# Patient Record
Sex: Female | Born: 1960 | Race: White | Hispanic: No | Marital: Married | Smoking: Never smoker
Health system: Southern US, Community
[De-identification: ages and names within clinical notes are randomized; demographics above are authoritative.]

## PROBLEM LIST (undated history)

## (undated) DIAGNOSIS — K529 Noninfective gastroenteritis and colitis, unspecified: Secondary | ICD-10-CM

## (undated) DIAGNOSIS — H269 Unspecified cataract: Secondary | ICD-10-CM

## (undated) DIAGNOSIS — Z9289 Personal history of other medical treatment: Secondary | ICD-10-CM

## (undated) DIAGNOSIS — E0590 Thyrotoxicosis, unspecified without thyrotoxic crisis or storm: Secondary | ICD-10-CM

## (undated) DIAGNOSIS — B009 Herpesviral infection, unspecified: Secondary | ICD-10-CM

## (undated) DIAGNOSIS — E119 Type 2 diabetes mellitus without complications: Secondary | ICD-10-CM

## (undated) DIAGNOSIS — E559 Vitamin D deficiency, unspecified: Secondary | ICD-10-CM

## (undated) DIAGNOSIS — I10 Essential (primary) hypertension: Secondary | ICD-10-CM

## (undated) DIAGNOSIS — H353110 Nonexudative age-related macular degeneration, right eye, stage unspecified: Secondary | ICD-10-CM

## (undated) DIAGNOSIS — H3530 Unspecified macular degeneration: Secondary | ICD-10-CM

## (undated) DIAGNOSIS — E039 Hypothyroidism, unspecified: Secondary | ICD-10-CM

## (undated) DIAGNOSIS — K219 Gastro-esophageal reflux disease without esophagitis: Secondary | ICD-10-CM

## (undated) DIAGNOSIS — Z8619 Personal history of other infectious and parasitic diseases: Secondary | ICD-10-CM

## (undated) HISTORY — DX: Unspecified macular degeneration: H35.30

## (undated) HISTORY — DX: Unspecified cataract: H26.9

## (undated) HISTORY — DX: Nonexudative age-related macular degeneration, right eye, stage unspecified: H35.3110

## (undated) HISTORY — DX: Vitamin D deficiency, unspecified: E55.9

## (undated) HISTORY — DX: Personal history of other infectious and parasitic diseases: Z86.19

## (undated) HISTORY — DX: Thyrotoxicosis, unspecified without thyrotoxic crisis or storm: E05.90

## (undated) HISTORY — PX: WISDOM TOOTH EXTRACTION: SHX21

## (undated) HISTORY — DX: Personal history of other medical treatment: Z92.89

## (undated) HISTORY — DX: Noninfective gastroenteritis and colitis, unspecified: K52.9

## (undated) HISTORY — DX: Essential (primary) hypertension: I10

## (undated) HISTORY — DX: Herpesviral infection, unspecified: B00.9

## (undated) HISTORY — DX: Hypothyroidism, unspecified: E03.9

## (undated) HISTORY — DX: Gastro-esophageal reflux disease without esophagitis: K21.9

## (undated) HISTORY — DX: Type 2 diabetes mellitus without complications: E11.9

## (undated) HISTORY — PX: COLONOSCOPY: SHX174

---
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
## 1993-03-11 HISTORY — PX: ECTOPIC PREGNANCY SURGERY: SHX613

## 2000-10-28 ENCOUNTER — Other Ambulatory Visit: Admission: RE | Admit: 2000-10-28 | Discharge: 2000-10-28 | Payer: Self-pay | Admitting: Internal Medicine

## 2001-07-09 HISTORY — PX: BUNIONECTOMY: SHX129

## 2001-12-10 ENCOUNTER — Other Ambulatory Visit: Admission: RE | Admit: 2001-12-10 | Discharge: 2001-12-10 | Payer: Self-pay | Admitting: Internal Medicine

## 2003-02-24 ENCOUNTER — Other Ambulatory Visit: Admission: RE | Admit: 2003-02-24 | Discharge: 2003-02-24 | Payer: Self-pay | Admitting: Internal Medicine

## 2003-03-12 HISTORY — PX: RIGHT OOPHORECTOMY: SHX2359

## 2003-03-12 HISTORY — PX: CHOLECYSTECTOMY: SHX55

## 2004-02-08 LAB — HM COLONOSCOPY

## 2004-07-17 ENCOUNTER — Ambulatory Visit: Payer: Self-pay | Admitting: Internal Medicine

## 2004-08-17 ENCOUNTER — Ambulatory Visit: Payer: Self-pay | Admitting: Internal Medicine

## 2005-11-05 ENCOUNTER — Ambulatory Visit: Payer: Self-pay | Admitting: Gastroenterology

## 2005-11-29 ENCOUNTER — Ambulatory Visit: Payer: Self-pay | Admitting: Gastroenterology

## 2013-06-07 LAB — HM DIABETES EYE EXAM

## 2013-07-22 ENCOUNTER — Encounter (INDEPENDENT_AMBULATORY_CARE_PROVIDER_SITE_OTHER): Payer: Self-pay

## 2013-07-22 ENCOUNTER — Ambulatory Visit (INDEPENDENT_AMBULATORY_CARE_PROVIDER_SITE_OTHER): Payer: BC Managed Care – PPO | Admitting: Adult Health

## 2013-07-22 ENCOUNTER — Encounter: Payer: Self-pay | Admitting: Adult Health

## 2013-07-22 VITALS — BP 134/92 | HR 92 | Temp 98.1°F | Resp 14 | Wt 225.5 lb

## 2013-07-22 DIAGNOSIS — E669 Obesity, unspecified: Secondary | ICD-10-CM | POA: Insufficient documentation

## 2013-07-22 MED ORDER — PHENTERMINE HCL 37.5 MG PO TABS: 37.5000 mg | ORAL_TABLET | Freq: Every day | ORAL | Status: DC

## 2013-07-22 NOTE — Progress Notes (Signed)
Patient ID: Courtney Burns, female   DOB: 30-Sep-1960, 53 y.o.   MRN: [id number]   Subjective:    Patient ID: Courtney Burns, female    DOB: 07-14-1960, 53 y.o.   MRN: [id number]  HPI Pt is a pleasant 53 y/o female who presents to clinic to establish care. Previously followed by Dr. Rosario Jacks. She has concerns about her weight. Reports struggling with weight since she was a child. She has lost 30+ lbs in the past only to gain it all back and then some. She is very frustrated with herself. Understands that her health is at risk. She would like guidance and assistance with weight loss.    Past Medical History  Diagnosis Date  . Diabetes mellitus without complication   . GERD (gastroesophageal reflux disease)   . Hypothyroidism   . History of chicken pox      Past Surgical History  Procedure Laterality Date  . Cholecystectomy  2005  . Appendectomy  1970  . Bunionectomy Right 07/2001    right foot  . Right oophorectomy Right 03/2003    Abscessed cyst removed  . Ectopic pregnancy surgery  1995     Family History  Problem Relation Age of Onset  . Hypertension Mother   . Diabetes Mother   . Hyperlipidemia Father   . Heart disease Father     MI  . Hypertension Father   . Diabetes Father   . Depression Father   . COPD Father   . Diabetes Maternal Aunt     Uncontrolled with complications  . Kidney disease Maternal Aunt     Renal failure - dialysis  . Stroke Maternal Grandmother   . Lung cancer Maternal Grandfather   . Heart disease Paternal Grandmother     MI  . Diabetes Paternal Grandmother   . Prostate cancer Paternal Grandfather      History   Social History  . Marital Status: Married    Spouse Name: N/A    Number of Children: 0  . Years of Education: 16   Occupational History  . Customer Service Manager     [employer]   Social History Main Topics  . Smoking status: Never Smoker   . Smokeless tobacco: Not on file  . Alcohol Use: No  . Drug Use: No    . Sexual Activity: Not on file   Other Topics Concern  . Not on file   Social History Narrative   Jaleyah grew up in [place]. She lives in [place] with her husband [name]) and their 3 cat ([name], [place], [place]). She attended [employer] and obtained her Bachelors in [employer] with a minor in Youth worker. She loves cooking and sewing.    Review of Systems  Constitutional:       Weight concerns. Has been overweight most of her life  HENT: Negative.   Eyes: Negative.   Respiratory: Negative.   Cardiovascular: Negative.   Gastrointestinal: Negative.   Endocrine: Negative.   Genitourinary: Negative.   Musculoskeletal: Negative.   Skin: Negative.   Allergic/Immunologic: Negative.   Neurological: Negative.   Hematological: Negative.   Psychiatric/Behavioral: Negative.        Objective:  BP 134/92  Pulse 92  Temp(Src) 98.1 F (36.7 C) (Oral)  Resp 14  Wt 225 lb 8 oz (102.286 kg)  SpO2 98%  LMP 07/07/2013   Physical Exam  Constitutional: She is oriented to person, place, and time. No distress.  Overweight, pleasant 53 y/o female  HENT:  Head: Normocephalic and atraumatic.  Eyes: Conjunctivae and EOM are normal.  Neck: Normal range of motion. Neck supple.  Cardiovascular: Normal rate, regular rhythm, normal heart sounds and intact distal pulses.  Exam reveals no gallop and no friction rub.   No murmur heard. Pulmonary/Chest: Effort normal and breath sounds normal. No respiratory distress. She has no wheezes. She has no rales.  Musculoskeletal: Normal range of motion.  Neurological: She is alert and oriented to person, place, and time. She has normal reflexes. Coordination normal.  Skin: Skin is warm and dry.  Psychiatric: She has a normal mood and affect. Her behavior is normal. Judgment and thought content normal.      Assessment & Plan:   1. Obesity Discussed importance of consistency in whichever program she decides will work best  for her. Recommend Weight Watchers. Increase activity gradually and remain consistent. Discussed pharmacological options to help curb appetite. Used short term. During this time she should make attempts to change bad habits and create healthier options. Start phentermine 37.5 mg daily. Follow up in 1-2 months or sooner if necessary.

## 2013-07-22 NOTE — Progress Notes (Signed)
Pre visit review using our clinic review tool, if applicable. No additional management support is needed unless otherwise documented below in the visit note. 

## 2013-07-22 NOTE — Patient Instructions (Signed)
   Thank you for choosing [facility] for your health care needs.  Start Phentermine for weight loss. Increase physical activity. I recommend Weight watchers which is very effective.  Schedule you physical exam at your earliest convenience.

## 2013-08-09 DIAGNOSIS — Z9289 Personal history of other medical treatment: Secondary | ICD-10-CM

## 2013-08-09 HISTORY — DX: Personal history of other medical treatment: Z92.89

## 2013-08-19 ENCOUNTER — Encounter: Payer: Self-pay | Admitting: Adult Health

## 2013-08-19 ENCOUNTER — Other Ambulatory Visit (HOSPITAL_COMMUNITY)
Admission: RE | Admit: 2013-08-19 | Discharge: 2013-08-19 | Disposition: A | Discharge status: Home / Self Care | Payer: BC Managed Care – PPO | Source: Ambulatory Visit | Attending: Adult Health | Admitting: Adult Health

## 2013-08-19 ENCOUNTER — Ambulatory Visit (INDEPENDENT_AMBULATORY_CARE_PROVIDER_SITE_OTHER): Payer: BC Managed Care – PPO | Admitting: Adult Health

## 2013-08-19 VITALS — BP 130/82 | HR 88 | Temp 98.4°F | Resp 14 | Ht 63.5 in | Wt 222.0 lb

## 2013-08-19 DIAGNOSIS — Z0000 Encounter for general adult medical examination without abnormal findings: Secondary | ICD-10-CM

## 2013-08-19 DIAGNOSIS — Z1151 Encounter for screening for human papillomavirus (HPV): Secondary | ICD-10-CM | POA: Insufficient documentation

## 2013-08-19 DIAGNOSIS — Z01419 Encounter for gynecological examination (general) (routine) without abnormal findings: Secondary | ICD-10-CM | POA: Insufficient documentation

## 2013-08-19 DIAGNOSIS — E785 Hyperlipidemia, unspecified: Secondary | ICD-10-CM

## 2013-08-19 DIAGNOSIS — E119 Type 2 diabetes mellitus without complications: Secondary | ICD-10-CM | POA: Insufficient documentation

## 2013-08-19 DIAGNOSIS — Z1239 Encounter for other screening for malignant neoplasm of breast: Secondary | ICD-10-CM

## 2013-08-19 DIAGNOSIS — E039 Hypothyroidism, unspecified: Secondary | ICD-10-CM | POA: Insufficient documentation

## 2013-08-19 NOTE — Patient Instructions (Signed)
  You had your annual physical exam today including Pap.  Return for your fasting labs at your earliest convenience. Nothing to eat or drink after midnight except water. Please schedule a lab appointment.  I will contact you with the results of your labs once they are available.  Mammogram ordered. Please schedule this at your earliest convenience.

## 2013-08-19 NOTE — Progress Notes (Signed)
Pre visit review using our clinic review tool, if applicable. No additional management support is needed unless otherwise documented below in the visit note. 

## 2013-08-19 NOTE — Addendum Note (Signed)
Addended by: Karlene Einstein D on: 08/19/2013 04:06 PM   Modules accepted: Orders

## 2013-08-19 NOTE — Progress Notes (Signed)
Patient ID: Courtney Burns, female   DOB: [birth year], 53 y.o.   MRN: [id number]   Subjective:    Patient ID: Courtney Burns, female    DOB: 06/19/1960, 53 y.o.   MRN: [id number]  HPI  Pt is a pleasant 53 y/o female who presents to clinic for her annual physical exam including breast, Pap and labs. Overall, she is feeling well. No concerns this visit.    Past Medical History  Diagnosis Date  . Diabetes mellitus without complication   . GERD (gastroesophageal reflux disease)   . Hypothyroidism   . History of chicken pox      Past Surgical History  Procedure Laterality Date  . Cholecystectomy  2005  . Appendectomy  1970  . Bunionectomy Right 07/2001    right foot  . Right oophorectomy Right 03/2003    Abscessed cyst removed  . Ectopic pregnancy surgery  1995     Family History  Problem Relation Age of Onset  . Hypertension Mother   . Diabetes Mother   . Hyperlipidemia Father   . Heart disease Father     MI  . Hypertension Father   . Diabetes Father   . Depression Father   . COPD Father   . Diabetes Maternal Aunt     Uncontrolled with complications  . Kidney disease Maternal Aunt     Renal failure - dialysis  . Stroke Maternal Grandmother   . Lung cancer Maternal Grandfather   . Heart disease Paternal Grandmother     MI  . Diabetes Paternal Grandmother   . Prostate cancer Paternal Grandfather      History   Social History  . Marital Status: Married    Spouse Name: N/A    Number of Children: 0  . Years of Education: 16   Occupational History  . Customer Service Manager     [employer]   Social History Main Topics  . Smoking status: Never Smoker   . Smokeless tobacco: Not on file  . Alcohol Use: No  . Drug Use: No  . Sexual Activity: Not on file   Other Topics Concern  . Not on file   Social History Narrative   Courtney Burns grew up in [place]. She lives in [place] with her husband [name]) and their 3 cat ([name], [place], [place]). She  attended [employer] and obtained her Bachelors in [employer] with a minor in Youth worker. She loves cooking and sewing.     Current Outpatient Prescriptions on File Prior to Visit  Medication Sig Dispense Refill  . levothyroxine (SYNTHROID, LEVOTHROID) 75 MCG tablet Take 75 mcg by mouth daily before breakfast.      . lisinopril (PRINIVIL,ZESTRIL) 5 MG tablet Take 5 mg by mouth daily.      . metFORMIN (GLUCOPHAGE) 1000 MG tablet Take 1,000 mg by mouth 2 (two) times daily with a meal.      . phentermine (ADIPEX-P) 37.5 MG tablet Take 1 tablet (37.5 mg total) by mouth daily before breakfast.  30 tablet  3   No current facility-administered medications on file prior to visit.     Review of Systems  Constitutional: Negative.   HENT: Negative.   Eyes: Negative.   Respiratory: Negative.   Cardiovascular: Negative.   Gastrointestinal: Negative.   Endocrine: Negative.   Genitourinary: Negative.   Musculoskeletal: Negative.   Skin: Negative.   Allergic/Immunologic: Negative.   Neurological: Negative.   Hematological: Negative.   Psychiatric/Behavioral: Negative.  Objective:  BP 130/82  Pulse 88  Temp(Src) 98.4 F (36.9 C) (Oral)  Resp 14  Ht 5' 3.5" (1.613 m)  Wt 222 lb (100.699 kg)  BMI 38.70 kg/m2  SpO2 97%  LMP 07/07/2013   Physical Exam  Constitutional: She is oriented to person, place, and time. She appears well-developed and well-nourished. No distress.  HENT:  Head: Normocephalic and atraumatic.  Right Ear: External ear normal.  Left Ear: External ear normal.  Nose: Nose normal.  Mouth/Throat: Oropharynx is clear and moist.  Eyes: Conjunctivae and EOM are normal. Pupils are equal, round, and reactive to light.  Neck: Normal range of motion. Neck supple. No tracheal deviation present. No thyromegaly present.  Cardiovascular: Normal rate, regular rhythm, normal heart sounds and intact distal pulses.  Exam reveals no gallop and no  friction rub.   No murmur heard. Pulmonary/Chest: Effort normal and breath sounds normal. No respiratory distress. She has no wheezes. She has no rales. Right breast exhibits no inverted nipple, no mass, no nipple discharge, no skin change and no tenderness. Left breast exhibits no inverted nipple, no mass, no nipple discharge, no skin change and no tenderness. Breasts are symmetrical.  Abdominal: Soft. Bowel sounds are normal. She exhibits no distension and no mass. There is no tenderness. There is no rebound and no guarding. Hernia confirmed negative in the right inguinal area.  Genitourinary: Rectum normal. Rectal exam shows no external hemorrhoid, no internal hemorrhoid, no fissure, no mass, no tenderness and anal tone normal. Guaiac negative stool. No breast swelling, tenderness, discharge or bleeding. No labial fusion. There is no rash, tenderness, lesion or injury on the right labia. There is rash on the left labia. There is no tenderness, lesion or injury on the left labia. Uterus is not deviated, not enlarged, not fixed and not tender. Cervix exhibits no motion tenderness, no discharge and no friability. Right adnexum displays no mass, no tenderness and no fullness. Left adnexum displays no mass, no tenderness and no fullness. No erythema around the vagina. No foreign body around the vagina. No signs of injury around the vagina. No vaginal discharge found.  Musculoskeletal: Normal range of motion. She exhibits no edema and no tenderness.  Lymphadenopathy:    She has no cervical adenopathy.       Right: No inguinal adenopathy present.       Left: No inguinal adenopathy present.  Neurological: She is alert and oriented to person, place, and time. She has normal reflexes. No cranial nerve deficit. Coordination normal.  Skin: Skin is warm and dry.  Psychiatric: She has a normal mood and affect. Her behavior is normal. Judgment and thought content normal.      Assessment & Plan:   1. Routine  general medical examination at a health care facility Normal physical exam including breast and PAP/pelvic. Screenings addressed. Labs ordered. Normal exam.   - Vit D  25 hydroxy (rtn osteoporosis monitoring); Future - CBC with Differential; Future - Vitamin B12; Future - Comprehensive metabolic panel; Future  2. Hypothyroidism Check labs. Continue medication and adjust as indicated. Continue to follow - TSH; Future  3. HLD (hyperlipidemia) Check lipids. Follow - Lipid panel; Future  4. Screening for breast cancer Order provided for mammogram. Patient will self schedule - MM DIGITAL SCREENING BILATERAL; Future  5. Diabetes mellitus, type 2 On metformin. Check labs. Continue to follow - Hemoglobin A1c; Future

## 2013-08-24 ENCOUNTER — Encounter: Payer: Self-pay | Admitting: *Deleted

## 2013-08-24 LAB — CYTOLOGY - PAP

## 2013-08-26 ENCOUNTER — Other Ambulatory Visit (INDEPENDENT_AMBULATORY_CARE_PROVIDER_SITE_OTHER): Payer: BC Managed Care – PPO

## 2013-08-26 DIAGNOSIS — E039 Hypothyroidism, unspecified: Secondary | ICD-10-CM

## 2013-08-26 DIAGNOSIS — E785 Hyperlipidemia, unspecified: Secondary | ICD-10-CM

## 2013-08-26 DIAGNOSIS — Z0000 Encounter for general adult medical examination without abnormal findings: Secondary | ICD-10-CM

## 2013-08-26 DIAGNOSIS — E119 Type 2 diabetes mellitus without complications: Secondary | ICD-10-CM

## 2013-08-26 LAB — LIPID PANEL
Cholesterol: 189 mg/dL (ref 0–200)
HDL: 33.5 mg/dL — ABNORMAL LOW (ref >39.00)
LDL Cholesterol: 126 mg/dL — ABNORMAL HIGH (ref 0–99)
NonHDL: 155.5
Total CHOL/HDL Ratio: 6
Triglycerides: 149 mg/dL (ref 0.0–149.0)
VLDL: 29.8 mg/dL (ref 0.0–40.0)

## 2013-08-26 LAB — COMPREHENSIVE METABOLIC PANEL
ALT: 65 U/L — ABNORMAL HIGH (ref 0–35)
AST: 40 U/L — ABNORMAL HIGH (ref 0–37)
Albumin: 4.1 g/dL (ref 3.5–5.2)
Alkaline Phosphatase: 73 U/L (ref 39–117)
BUN: 14 mg/dL (ref 6–23)
CO2: 23 mEq/L (ref 19–32)
Calcium: 9.3 mg/dL (ref 8.4–10.5)
Chloride: 107 mEq/L (ref 96–112)
Creatinine, Ser: 0.8 mg/dL (ref 0.4–1.2)
GFR: 82.94 mL/min (ref >60.00)
Glucose, Bld: 173 mg/dL — ABNORMAL HIGH (ref 70–99)
Potassium: 4.4 mEq/L (ref 3.5–5.1)
Sodium: 138 mEq/L (ref 135–145)
Total Bilirubin: 0.5 mg/dL (ref 0.2–1.2)
Total Protein: 6.6 g/dL (ref 6.0–8.3)

## 2013-08-26 LAB — CBC WITH DIFFERENTIAL/PLATELET
Basophils Absolute: 0 10*3/uL (ref 0.0–0.1)
Basophils Relative: 0.5 % (ref 0.0–3.0)
Eosinophils Absolute: 0.2 10*3/uL (ref 0.0–0.7)
Eosinophils Relative: 2.2 % (ref 0.0–5.0)
HCT: 39.9 % (ref 36.0–46.0)
Hemoglobin: 13.3 g/dL (ref 12.0–15.0)
Lymphocytes Relative: 27.2 % (ref 12.0–46.0)
Lymphs Abs: 2.2 10*3/uL (ref 0.7–4.0)
MCHC: 33.4 g/dL (ref 30.0–36.0)
MCV: 87.2 fl (ref 78.0–100.0)
Monocytes Absolute: 0.6 10*3/uL (ref 0.1–1.0)
Monocytes Relative: 7.7 % (ref 3.0–12.0)
Neutro Abs: 4.9 10*3/uL (ref 1.4–7.7)
Neutrophils Relative %: 62.4 % (ref 43.0–77.0)
Platelets: 418 10*3/uL — ABNORMAL HIGH (ref 150.0–400.0)
RBC: 4.58 Mil/uL (ref 3.87–5.11)
RDW: 14 % (ref 11.5–15.5)
WBC: 7.9 10*3/uL (ref 4.0–10.5)

## 2013-08-26 LAB — HEMOGLOBIN A1C: Hgb A1c MFr Bld: 7.4 % — ABNORMAL HIGH (ref 4.6–6.5)

## 2013-08-26 LAB — VITAMIN B12: Vitamin B-12: 279 pg/mL (ref 211–911)

## 2013-08-26 LAB — VITAMIN D 25 HYDROXY (VIT D DEFICIENCY, FRACTURES): VITD: 12.53 ng/mL

## 2013-08-26 LAB — TSH: TSH: 2.68 u[IU]/mL (ref 0.35–4.50)

## 2013-08-27 ENCOUNTER — Other Ambulatory Visit: Payer: Self-pay | Admitting: Adult Health

## 2013-08-27 ENCOUNTER — Encounter: Payer: Self-pay | Admitting: Adult Health

## 2013-08-27 DIAGNOSIS — R7401 Elevation of levels of liver transaminase levels: Secondary | ICD-10-CM

## 2013-08-27 DIAGNOSIS — R740 Nonspecific elevation of levels of transaminase and lactic acid dehydrogenase [LDH]: Principal | ICD-10-CM

## 2013-09-07 LAB — HM PAP SMEAR: HM Pap smear: NEGATIVE

## 2013-09-14 ENCOUNTER — Other Ambulatory Visit (INDEPENDENT_AMBULATORY_CARE_PROVIDER_SITE_OTHER): Payer: BC Managed Care – PPO

## 2013-09-14 ENCOUNTER — Other Ambulatory Visit: Payer: Self-pay | Admitting: Adult Health

## 2013-09-14 ENCOUNTER — Encounter: Payer: Self-pay | Admitting: Adult Health

## 2013-09-14 DIAGNOSIS — R7401 Elevation of levels of liver transaminase levels: Secondary | ICD-10-CM

## 2013-09-14 DIAGNOSIS — R7402 Elevation of levels of lactic acid dehydrogenase (LDH): Secondary | ICD-10-CM

## 2013-09-14 DIAGNOSIS — R740 Nonspecific elevation of levels of transaminase and lactic acid dehydrogenase [LDH]: Principal | ICD-10-CM

## 2013-09-14 LAB — HEPATIC FUNCTION PANEL
ALT: 61 U/L — ABNORMAL HIGH (ref 0–35)
AST: 44 U/L — ABNORMAL HIGH (ref 0–37)
Albumin: 4 g/dL (ref 3.5–5.2)
Alkaline Phosphatase: 74 U/L (ref 39–117)
Bilirubin, Direct: 0 mg/dL (ref 0.0–0.3)
Total Bilirubin: 0.5 mg/dL (ref 0.2–1.2)
Total Protein: 6.9 g/dL (ref 6.0–8.3)

## 2013-09-15 ENCOUNTER — Other Ambulatory Visit: Payer: Self-pay | Admitting: Adult Health

## 2013-09-15 DIAGNOSIS — R740 Nonspecific elevation of levels of transaminase and lactic acid dehydrogenase [LDH]: Principal | ICD-10-CM

## 2013-09-15 DIAGNOSIS — R7401 Elevation of levels of liver transaminase levels: Secondary | ICD-10-CM

## 2013-09-15 NOTE — Progress Notes (Signed)
Notified patient of Raquel's comments. Patient verbalized understanding. Will be out of town until 09/26/13. Lab appt scheduled for 09/28/13 at 10:45am

## 2013-09-15 NOTE — Progress Notes (Signed)
Called patient's cell phone, no answer. Left message on voicemail asking patient to call the office at her earliest convenience.

## 2013-09-23 ENCOUNTER — Telehealth: Payer: Self-pay | Admitting: Adult Health

## 2013-09-23 NOTE — Telephone Encounter (Signed)
She is on vacation she will schedule when she gets back.

## 2013-09-23 NOTE — Telephone Encounter (Signed)
LMTCB to check to see if she has scheduled her mammo/msn

## 2013-09-28 ENCOUNTER — Encounter: Payer: Self-pay | Admitting: Adult Health

## 2013-09-28 ENCOUNTER — Other Ambulatory Visit (INDEPENDENT_AMBULATORY_CARE_PROVIDER_SITE_OTHER): Payer: BC Managed Care – PPO

## 2013-09-28 DIAGNOSIS — R7402 Elevation of levels of lactic acid dehydrogenase (LDH): Secondary | ICD-10-CM

## 2013-09-28 DIAGNOSIS — R740 Nonspecific elevation of levels of transaminase and lactic acid dehydrogenase [LDH]: Principal | ICD-10-CM

## 2013-09-28 DIAGNOSIS — R7401 Elevation of levels of liver transaminase levels: Secondary | ICD-10-CM

## 2013-09-28 LAB — IRON: Iron: 72 ug/dL (ref 42–145)

## 2013-09-28 LAB — FERRITIN: Ferritin: 69.9 ng/mL (ref 10.0–291.0)

## 2013-09-29 ENCOUNTER — Encounter: Payer: Self-pay | Admitting: Adult Health

## 2013-09-29 LAB — HEPATITIS C ANTIBODY: HCV Ab: NEGATIVE

## 2013-09-29 LAB — ANTI-SMITH ANTIBODY: ENA SM Ab Ser-aCnc: <1

## 2013-09-29 LAB — HEPATITIS B SURFACE ANTIBODY,QUALITATIVE: Hep B S Ab: NEGATIVE

## 2013-09-29 LAB — HEPATITIS B SURFACE ANTIGEN: Hepatitis B Surface Ag: NEGATIVE

## 2013-09-29 LAB — ANA: Anti Nuclear Antibody(ANA): NEGATIVE

## 2013-09-29 LAB — HEPATITIS A ANTIBODY, IGM: Hep A IgM: NONREACTIVE

## 2013-09-29 LAB — HEPATITIS B CORE ANTIBODY, TOTAL: Hep B Core Total Ab: NONREACTIVE

## 2013-10-01 ENCOUNTER — Encounter: Payer: Self-pay | Admitting: Adult Health

## 2013-10-04 LAB — HM MAMMOGRAPHY: HM Mammogram: NEGATIVE

## 2013-10-05 ENCOUNTER — Other Ambulatory Visit: Payer: Self-pay | Admitting: Adult Health

## 2013-10-05 DIAGNOSIS — K760 Fatty (change of) liver, not elsewhere classified: Secondary | ICD-10-CM

## 2013-10-05 NOTE — Progress Notes (Signed)
Ultrasound of abdomen shows fatty liver disease. I am referring you to GI. Diet, exercise, reducing weight is important.

## 2013-10-05 NOTE — Progress Notes (Signed)
Spoke to patient to notify her of Raquel's comments. Patient verbalized understanding.

## 2013-10-18 ENCOUNTER — Other Ambulatory Visit: Payer: Self-pay | Admitting: Adult Health

## 2013-10-27 ENCOUNTER — Encounter: Payer: Self-pay | Admitting: Adult Health

## 2013-10-29 ENCOUNTER — Other Ambulatory Visit: Payer: Self-pay | Admitting: Adult Health

## 2013-11-17 ENCOUNTER — Other Ambulatory Visit: Payer: Self-pay | Admitting: Adult Health

## 2013-12-20 ENCOUNTER — Telehealth: Payer: Self-pay

## 2013-12-20 NOTE — Telephone Encounter (Signed)
This patient called and stated her friend Manuela Schwartz told her that at the gym you mentioned you would take her on as a new patient.  Is it still okay to add her on your schedule?   Pt callback - [phone]

## 2013-12-20 NOTE — Telephone Encounter (Signed)
Yes, fine to add. Next 52mn new pt

## 2014-01-16 ENCOUNTER — Encounter: Payer: Self-pay | Admitting: Internal Medicine

## 2014-02-07 ENCOUNTER — Encounter: Payer: Self-pay | Admitting: Internal Medicine

## 2014-02-07 ENCOUNTER — Ambulatory Visit (INDEPENDENT_AMBULATORY_CARE_PROVIDER_SITE_OTHER): Payer: BC Managed Care – PPO | Admitting: Internal Medicine

## 2014-02-07 VITALS — BP 123/81 | HR 90 | Temp 98.5°F | Ht 63.5 in | Wt 201.5 lb

## 2014-02-07 DIAGNOSIS — E785 Hyperlipidemia, unspecified: Secondary | ICD-10-CM

## 2014-02-07 DIAGNOSIS — K7581 Nonalcoholic steatohepatitis (NASH): Secondary | ICD-10-CM

## 2014-02-07 DIAGNOSIS — E669 Obesity, unspecified: Secondary | ICD-10-CM

## 2014-02-07 DIAGNOSIS — E039 Hypothyroidism, unspecified: Secondary | ICD-10-CM

## 2014-02-07 DIAGNOSIS — E119 Type 2 diabetes mellitus without complications: Secondary | ICD-10-CM

## 2014-02-07 LAB — COMPREHENSIVE METABOLIC PANEL
ALT: 40 U/L — ABNORMAL HIGH (ref 0–35)
AST: 32 U/L (ref 0–37)
Albumin: 4.3 g/dL (ref 3.5–5.2)
Alkaline Phosphatase: 83 U/L (ref 39–117)
BUN: 12 mg/dL (ref 6–23)
CO2: 21 mEq/L (ref 19–32)
Calcium: 9 mg/dL (ref 8.4–10.5)
Chloride: 104 mEq/L (ref 96–112)
Creatinine, Ser: 0.7 mg/dL (ref 0.4–1.2)
GFR: 86.69 mL/min (ref >60.00)
Glucose, Bld: 108 mg/dL — ABNORMAL HIGH (ref 70–99)
Potassium: 4 mEq/L (ref 3.5–5.1)
Sodium: 136 mEq/L (ref 135–145)
Total Bilirubin: 0.3 mg/dL (ref 0.2–1.2)
Total Protein: 6.9 g/dL (ref 6.0–8.3)

## 2014-02-07 LAB — HM DIABETES FOOT EXAM: HM Diabetic Foot Exam: NORMAL

## 2014-02-07 LAB — HEMOGLOBIN A1C: Hgb A1c MFr Bld: 7.3 % — ABNORMAL HIGH (ref 4.6–6.5)

## 2014-02-07 NOTE — Progress Notes (Signed)
Pre visit review using our clinic review tool, if applicable. No additional management support is needed unless otherwise documented below in the visit note. 

## 2014-02-07 NOTE — Assessment & Plan Note (Signed)
Will check LFTs with labs today.

## 2014-02-07 NOTE — Patient Instructions (Signed)
Labs today.  Follow up 3 months.

## 2014-02-07 NOTE — Assessment & Plan Note (Signed)
Will check A1c with labs today. Continue Metformin.

## 2014-02-07 NOTE — Assessment & Plan Note (Signed)
Will repeat LFTs with labs today. Reviewed previous workup and [place].

## 2014-02-07 NOTE — Assessment & Plan Note (Signed)
Wt Readings from Last 3 Encounters:  02/07/14 201 lb 8 oz (91.4 kg)  08/19/13 222 lb (100.699 kg)  07/22/13 225 lb 8 oz (102.286 kg)   Encouraged continued healthy diet and exercise. Congratulated pt on weight loss.

## 2014-02-07 NOTE — Progress Notes (Signed)
Subjective:    Patient ID: Courtney Burns, female    DOB: September 09, 1960, 53 y.o.   MRN: [id number]  HPI 53YO female presents to establish care. Previously a patient of Raquel Rey's.  Wt Readings from Last 3 Encounters:  02/07/14 201 lb 8 oz (91.4 kg)  08/19/13 222 lb (100.699 kg)  07/22/13 225 lb 8 oz (102.286 kg)   DM - BG have been running less than 150. Compliant with metformin.  Limiting processed foods. Limiting diet soda. Drinking water.  Goal has been to lose 1lb per week.  Feeling well. No concerns today.  Review of Systems  Constitutional: Negative for fever, chills, appetite change, fatigue and unexpected weight change.  Eyes: Negative for visual disturbance.  Respiratory: Negative for shortness of breath.   Cardiovascular: Negative for chest pain and leg swelling.  Gastrointestinal: Negative for nausea, vomiting, abdominal pain, diarrhea and constipation.  Musculoskeletal: Negative for myalgias and arthralgias.  Skin: Negative for color change and rash.  Hematological: Negative for adenopathy. Does not bruise/bleed easily.  Psychiatric/Behavioral: Negative for sleep disturbance and dysphoric mood. The patient is not nervous/anxious.        Objective:    BP 123/81 mmHg  Pulse 90  Temp(Src) 98.5 F (36.9 C) (Oral)  Ht 5' 3.5" (1.613 m)  Wt 201 lb 8 oz (91.4 kg)  BMI 35.13 kg/m2  SpO2 97%  LMP 07/07/2013 Physical Exam  Constitutional: She is oriented to person, place, and time. She appears well-developed and well-nourished. No distress.  HENT:  Head: Normocephalic and atraumatic.  Right Ear: External ear normal.  Left Ear: External ear normal.  Nose: Nose normal.  Mouth/Throat: Oropharynx is clear and moist. No oropharyngeal exudate.  Eyes: Conjunctivae are normal. Pupils are equal, round, and reactive to light. Right eye exhibits no discharge. Left eye exhibits no discharge. No scleral icterus.  Neck: Normal range of motion. Neck supple. No tracheal  deviation present. No thyromegaly present.  Cardiovascular: Normal rate, regular rhythm, normal heart sounds and intact distal pulses.  Exam reveals no gallop and no friction rub.   No murmur heard. Pulmonary/Chest: Effort normal and breath sounds normal. No accessory muscle usage. No tachypnea. No respiratory distress. She has no decreased breath sounds. She has no wheezes. She has no rhonchi. She has no rales. She exhibits no tenderness.  Musculoskeletal: Normal range of motion. She exhibits no edema or tenderness.  Lymphadenopathy:    She has no cervical adenopathy.  Neurological: She is alert and oriented to person, place, and time. No cranial nerve deficit. She exhibits normal muscle tone. Coordination normal.  Skin: Skin is warm and dry. No rash noted. She is not diaphoretic. No erythema. No pallor.  Psychiatric: She has a normal mood and affect. Her behavior is normal. Judgment and thought content normal.          Assessment & Plan:   Problem List Items Addressed This Visit      Unprioritized   Diabetes mellitus, type 2 - Primary    Will check A1c with labs today. Continue Metformin.    Relevant Orders      Comprehensive metabolic panel      Hemoglobin A1c      Microalbumin / creatinine urine ratio   HLD (hyperlipidemia)    Will check LFTs with labs today.    Relevant Orders      Lipid panel   Hypothyroidism   NASH (nonalcoholic steatohepatitis)    Will repeat LFTs with labs today. Reviewed previous workup  and [place].    Obesity    Wt Readings from Last 3 Encounters:  02/07/14 201 lb 8 oz (91.4 kg)  08/19/13 222 lb (100.699 kg)  07/22/13 225 lb 8 oz (102.286 kg)   Encouraged continued healthy diet and exercise. Congratulated pt on weight loss.        Return in about 3 months (around 05/09/2014) for Recheck of Diabetes.

## 2014-04-11 ENCOUNTER — Other Ambulatory Visit: Payer: Self-pay | Admitting: *Deleted

## 2014-04-11 MED ORDER — LISINOPRIL 5 MG PO TABS: ORAL_TABLET | ORAL | Status: DC

## 2014-04-20 ENCOUNTER — Encounter: Payer: Self-pay | Admitting: Internal Medicine

## 2014-04-20 ENCOUNTER — Telehealth: Payer: Self-pay | Admitting: *Deleted

## 2014-04-20 MED ORDER — METFORMIN HCL 1000 MG PO TABS: 1000.0000 mg | ORAL_TABLET | Freq: Two times a day (BID) | ORAL | Status: DC

## 2014-04-20 NOTE — Telephone Encounter (Signed)
Pt requesting refill on Metformin 1070m, this is a historical medication for [place] Okay to fill?

## 2014-04-20 NOTE — Telephone Encounter (Signed)
Fine to fill for 1 year.

## 2014-05-09 ENCOUNTER — Ambulatory Visit (INDEPENDENT_AMBULATORY_CARE_PROVIDER_SITE_OTHER): Payer: BLUE CROSS/BLUE SHIELD | Admitting: Internal Medicine

## 2014-05-09 ENCOUNTER — Encounter: Payer: Self-pay | Admitting: Internal Medicine

## 2014-05-09 VITALS — BP 129/79 | HR 82 | Temp 98.7°F | Ht 63.5 in | Wt 204.5 lb

## 2014-05-09 DIAGNOSIS — E669 Obesity, unspecified: Secondary | ICD-10-CM

## 2014-05-09 DIAGNOSIS — E039 Hypothyroidism, unspecified: Secondary | ICD-10-CM

## 2014-05-09 DIAGNOSIS — E119 Type 2 diabetes mellitus without complications: Secondary | ICD-10-CM

## 2014-05-09 DIAGNOSIS — E785 Hyperlipidemia, unspecified: Secondary | ICD-10-CM

## 2014-05-09 LAB — COMPREHENSIVE METABOLIC PANEL
ALT: 32 U/L (ref 0–35)
AST: 22 U/L (ref 0–37)
Albumin: 4.2 g/dL (ref 3.5–5.2)
Alkaline Phosphatase: 83 U/L (ref 39–117)
BUN: 13 mg/dL (ref 6–23)
CO2: 25 mEq/L (ref 19–32)
Calcium: 9.1 mg/dL (ref 8.4–10.5)
Chloride: 105 mEq/L (ref 96–112)
Creatinine, Ser: 0.69 mg/dL (ref 0.40–1.20)
GFR: 93.89 mL/min (ref >60.00)
Glucose, Bld: 123 mg/dL — ABNORMAL HIGH (ref 70–99)
Potassium: 4 mEq/L (ref 3.5–5.1)
Sodium: 138 mEq/L (ref 135–145)
Total Bilirubin: 0.3 mg/dL (ref 0.2–1.2)
Total Protein: 6.9 g/dL (ref 6.0–8.3)

## 2014-05-09 LAB — TSH: TSH: 1.53 u[IU]/mL (ref 0.35–4.50)

## 2014-05-09 LAB — MICROALBUMIN / CREATININE URINE RATIO
Creatinine,U: 21.9 mg/dL
Microalb Creat Ratio: 3.2 mg/g (ref 0.0–30.0)
Microalb, Ur: <0.7 mg/dL (ref 0.0–1.9)

## 2014-05-09 LAB — HEMOGLOBIN A1C: Hgb A1c MFr Bld: 7.1 % — ABNORMAL HIGH (ref 4.6–6.5)

## 2014-05-09 MED ORDER — LEVOTHYROXINE SODIUM 75 MCG PO TABS: 75.0000 ug | ORAL_TABLET | Freq: Every morning | ORAL | Status: DC

## 2014-05-09 NOTE — Patient Instructions (Signed)
Labs today

## 2014-05-09 NOTE — Progress Notes (Signed)
Pre visit review using our clinic review tool, if applicable. No additional management support is needed unless otherwise documented below in the visit note. 

## 2014-05-09 NOTE — Assessment & Plan Note (Signed)
Will check TSH with labs today.

## 2014-05-09 NOTE — Progress Notes (Signed)
Subjective:    Patient ID: Courtney Burns, female    DOB: 20-Sep-1960, 54 y.o.   MRN: [id number]  HPI 54YO female presents for follow up.  DM - BG well controlled typically 100, highest 158.  Recently has had worsening hot flashes, up to 3 per day. Now improving, typically 1 per day. Does not want to start medication for this.  Recently gained about 5 lbs. Now back on track with diet and exercise.  Wt Readings from Last 3 Encounters:  05/09/14 204 lb 8 oz (92.761 kg)  02/07/14 201 lb 8 oz (91.4 kg)  08/19/13 222 lb (100.699 kg)    Past medical, surgical, family and social history per today's encounter.  Review of Systems  Constitutional: Positive for diaphoresis. Negative for fever, chills, appetite change, fatigue and unexpected weight change.  Eyes: Negative for visual disturbance.  Respiratory: Negative for shortness of breath.   Cardiovascular: Negative for chest pain and leg swelling.  Gastrointestinal: Negative for nausea, vomiting, abdominal pain, diarrhea and constipation.  Endocrine: Positive for heat intolerance.  Skin: Negative for color change and rash.  Hematological: Negative for adenopathy. Does not bruise/bleed easily.  Psychiatric/Behavioral: Negative for sleep disturbance and dysphoric mood. The patient is not nervous/anxious.        Objective:    BP 129/79 mmHg  Pulse 82  Temp(Src) 98.7 F (37.1 C) (Oral)  Ht 5' 3.5" (1.613 m)  Wt 204 lb 8 oz (92.761 kg)  BMI 35.65 kg/m2  SpO2 98%  LMP 07/07/2013 Physical Exam  Constitutional: She is oriented to person, place, and time. She appears well-developed and well-nourished. No distress.  HENT:  Head: Normocephalic and atraumatic.  Right Ear: External ear normal.  Left Ear: External ear normal.  Nose: Nose normal.  Mouth/Throat: Oropharynx is clear and moist. No oropharyngeal exudate.  Eyes: Conjunctivae are normal. Pupils are equal, round, and reactive to light. Right eye exhibits no discharge. Left  eye exhibits no discharge. No scleral icterus.  Neck: Normal range of motion. Neck supple. No tracheal deviation present. No thyromegaly present.  Cardiovascular: Normal rate, regular rhythm, normal heart sounds and intact distal pulses.  Exam reveals no gallop and no friction rub.   No murmur heard. Pulmonary/Chest: Effort normal and breath sounds normal. No respiratory distress. She has no wheezes. She has no rales. She exhibits no tenderness.  Musculoskeletal: Normal range of motion. She exhibits no edema or tenderness.  Lymphadenopathy:    She has no cervical adenopathy.  Neurological: She is alert and oriented to person, place, and time. No cranial nerve deficit. She exhibits normal muscle tone. Coordination normal.  Skin: Skin is warm and dry. No rash noted. She is not diaphoretic. No erythema. No pallor.  Psychiatric: She has a normal mood and affect. Her behavior is normal. Judgment and thought content normal.          Assessment & Plan:   Problem List Items Addressed This Visit      Unprioritized   Diabetes mellitus, type 2 - Primary    Will check A1c with labs today. Continue Metformin.      Relevant Orders   Comprehensive metabolic panel   Hemoglobin A1c   Microalbumin / creatinine urine ratio   HLD (hyperlipidemia)    Will check LFTs with labs.      Hypothyroidism    Will check TSH with labs today.      Relevant Medications   levothyroxine (SYNTHROID, LEVOTHROID) tablet   Other Relevant Orders  TSH   Obesity    Wt Readings from Last 3 Encounters:  05/09/14 204 lb 8 oz (92.761 kg)  02/07/14 201 lb 8 oz (91.4 kg)  08/19/13 222 lb (100.699 kg)   Body mass index is 35.65 kg/(m^2). Encouraged continued effort at healthy diet and exercise.          Return in about 3 months (around 08/07/2014) for Physical.

## 2014-05-09 NOTE — Assessment & Plan Note (Signed)
Wt Readings from Last 3 Encounters:  05/09/14 204 lb 8 oz (92.761 kg)  02/07/14 201 lb 8 oz (91.4 kg)  08/19/13 222 lb (100.699 kg)   Body mass index is 35.65 kg/(m^2). Encouraged continued effort at healthy diet and exercise.

## 2014-05-09 NOTE — Assessment & Plan Note (Signed)
Will check LFTs with labs.

## 2014-05-09 NOTE — Assessment & Plan Note (Signed)
Will check A1c with labs today. Continue Metformin.

## 2014-07-06 LAB — HM DIABETES EYE EXAM

## 2014-07-19 ENCOUNTER — Encounter: Payer: Self-pay | Admitting: Internal Medicine

## 2014-07-19 ENCOUNTER — Encounter: Payer: Self-pay | Admitting: Nurse Practitioner

## 2014-07-19 ENCOUNTER — Ambulatory Visit (INDEPENDENT_AMBULATORY_CARE_PROVIDER_SITE_OTHER): Payer: BLUE CROSS/BLUE SHIELD | Admitting: Nurse Practitioner

## 2014-07-19 VITALS — BP 138/88 | HR 81 | Temp 98.2°F | Resp 12 | Ht 63.5 in | Wt 213.4 lb

## 2014-07-19 DIAGNOSIS — R300 Dysuria: Secondary | ICD-10-CM | POA: Diagnosis not present

## 2014-07-19 LAB — POCT URINALYSIS DIPSTICK
Bilirubin, UA: NEGATIVE
Glucose, UA: NEGATIVE
Ketones, UA: NEGATIVE
Nitrite, UA: NEGATIVE
Protein, UA: NEGATIVE
Spec Grav, UA: <=1.005
Urobilinogen, UA: 0.2
pH, UA: 5.5

## 2014-07-19 MED ORDER — NITROFURANTOIN MONOHYD MACRO 100 MG PO CAPS: 100.0000 mg | ORAL_CAPSULE | Freq: Two times a day (BID) | ORAL | Status: DC

## 2014-07-19 NOTE — Progress Notes (Signed)
   Subjective:    Patient ID: Courtney Burns, female    DOB: [birth year], 54 y.o.   MRN: [id number]  HPI  Courtney Burns is a 54 yo female with a CC of dysuria and decreased urine output.   1) Pink when wiping, 1 episode this morning, started last thur/fri, felt better this weekend. Reported less urine output despite urgency and dysuria before the weekend.Treatment to date: Increased water                Review of Systems  Constitutional: Negative for fever, chills, diaphoresis and fatigue.  Genitourinary: Positive for dysuria, urgency, frequency, hematuria and decreased urine volume. Negative for flank pain.  Skin: Negative for rash.       Objective:   Physical Exam  Constitutional: She is oriented to person, place, and time. She appears well-developed and well-nourished. No distress.  BP 138/88 mmHg  Pulse 81  Temp(Src) 98.2 F (36.8 C) (Oral)  Resp 12  Ht 5' 3.5" (1.613 m)  Wt 213 lb 6.4 oz (96.798 kg)  BMI 37.20 kg/m2  SpO2 97%   HENT:  Head: Normocephalic and atraumatic.  Right Ear: External ear normal.  Left Ear: External ear normal.  Abdominal: There is no CVA tenderness.  Neurological: She is alert and oriented to person, place, and time. No cranial nerve deficit. She exhibits normal muscle tone. Coordination normal.  Skin: Skin is warm and dry. No rash noted. She is not diaphoretic.  Psychiatric: She has a normal mood and affect. Her behavior is normal. Judgment and thought content normal.          Assessment & Plan:

## 2014-07-19 NOTE — Patient Instructions (Signed)

## 2014-07-19 NOTE — Progress Notes (Signed)
Pre visit review using our clinic review tool, if applicable. No additional management support is needed unless otherwise documented below in the visit note. 

## 2014-07-22 LAB — URINE CULTURE: Colony Count: >=100000

## 2014-07-24 NOTE — Assessment & Plan Note (Signed)
POCT urine - tx based on this and await culture results. Macrobid 100 mg twice a day for 5 days.

## 2014-08-02 ENCOUNTER — Encounter: Payer: Self-pay | Admitting: *Deleted

## 2014-08-03 ENCOUNTER — Encounter: Payer: Self-pay | Admitting: Internal Medicine

## 2014-08-04 ENCOUNTER — Other Ambulatory Visit: Payer: Self-pay | Admitting: *Deleted

## 2014-08-04 MED ORDER — VALACYCLOVIR HCL 500 MG PO TABS: 500.0000 mg | ORAL_TABLET | Freq: Two times a day (BID) | ORAL | Status: DC

## 2014-08-18 ENCOUNTER — Encounter: Payer: Self-pay | Admitting: Internal Medicine

## 2014-08-24 ENCOUNTER — Ambulatory Visit: Payer: BLUE CROSS/BLUE SHIELD | Admitting: Internal Medicine

## 2014-10-03 ENCOUNTER — Ambulatory Visit: Payer: BLUE CROSS/BLUE SHIELD | Admitting: Internal Medicine

## 2014-10-04 ENCOUNTER — Encounter: Payer: Self-pay | Admitting: Internal Medicine

## 2014-10-04 ENCOUNTER — Ambulatory Visit (INDEPENDENT_AMBULATORY_CARE_PROVIDER_SITE_OTHER): Payer: BLUE CROSS/BLUE SHIELD | Admitting: Internal Medicine

## 2014-10-04 VITALS — BP 136/85 | HR 88 | Temp 98.3°F | Ht 63.5 in | Wt 214.5 lb

## 2014-10-04 DIAGNOSIS — E119 Type 2 diabetes mellitus without complications: Secondary | ICD-10-CM | POA: Diagnosis not present

## 2014-10-04 DIAGNOSIS — E669 Obesity, unspecified: Secondary | ICD-10-CM | POA: Diagnosis not present

## 2014-10-04 DIAGNOSIS — E039 Hypothyroidism, unspecified: Secondary | ICD-10-CM | POA: Diagnosis not present

## 2014-10-04 DIAGNOSIS — L578 Other skin changes due to chronic exposure to nonionizing radiation: Secondary | ICD-10-CM

## 2014-10-04 DIAGNOSIS — E785 Hyperlipidemia, unspecified: Secondary | ICD-10-CM | POA: Diagnosis not present

## 2014-10-04 LAB — COMPREHENSIVE METABOLIC PANEL
ALT: 35 U/L (ref 0–35)
AST: 25 U/L (ref 0–37)
Albumin: 4 g/dL (ref 3.5–5.2)
Alkaline Phosphatase: 81 U/L (ref 39–117)
BUN: 13 mg/dL (ref 6–23)
CO2: 22 mEq/L (ref 19–32)
Calcium: 9.1 mg/dL (ref 8.4–10.5)
Chloride: 105 mEq/L (ref 96–112)
Creatinine, Ser: 0.79 mg/dL (ref 0.40–1.20)
GFR: 80.2 mL/min (ref >60.00)
Glucose, Bld: 137 mg/dL — ABNORMAL HIGH (ref 70–99)
Potassium: 4.2 mEq/L (ref 3.5–5.1)
Sodium: 140 mEq/L (ref 135–145)
Total Bilirubin: 0.3 mg/dL (ref 0.2–1.2)
Total Protein: 6.4 g/dL (ref 6.0–8.3)

## 2014-10-04 LAB — LIPID PANEL
Cholesterol: 178 mg/dL (ref 0–200)
HDL: 34.9 mg/dL — ABNORMAL LOW (ref >39.00)
LDL Cholesterol: 110 mg/dL — ABNORMAL HIGH (ref 0–99)
NonHDL: 143.1
Total CHOL/HDL Ratio: 5
Triglycerides: 168 mg/dL — ABNORMAL HIGH (ref 0.0–149.0)
VLDL: 33.6 mg/dL (ref 0.0–40.0)

## 2014-10-04 LAB — TSH: TSH: 2.89 u[IU]/mL (ref 0.35–4.50)

## 2014-10-04 LAB — MICROALBUMIN / CREATININE URINE RATIO
Creatinine,U: 102.3 mg/dL
Microalb Creat Ratio: 0.7 mg/g (ref 0.0–30.0)
Microalb, Ur: <0.7 mg/dL (ref 0.0–1.9)

## 2014-10-04 LAB — HEMOGLOBIN A1C: Hgb A1c MFr Bld: 7.1 % — ABNORMAL HIGH (ref 4.6–6.5)

## 2014-10-04 NOTE — Assessment & Plan Note (Signed)
Will check A1c with labs. Continue Metformin.

## 2014-10-04 NOTE — Assessment & Plan Note (Signed)
Will check TSH with labs. Continue Levothyroxine.

## 2014-10-04 NOTE — Progress Notes (Signed)
Subjective:    Patient ID: Courtney Burns, female    DOB: 1960-08-18, 54 y.o.   MRN: [id number]  HPI  54YO female presents for follow up.  DM - BG running near 150 for high and 100 fasting. Compliant with medications.  Started diet program at work. Following healthy diet and plans to exercise.  Wt Readings from Last 3 Encounters:  10/04/14 214 lb 8 oz (97.297 kg)  07/19/14 213 lb 6.4 oz (96.798 kg)  05/09/14 204 lb 8 oz (92.761 kg)   Rash - Noted rash over arms and lower legs after recent vacation to beach. Not painful or itchy. Seems to be improving.  Past medical, surgical, family and social history per today's encounter.  Review of Systems  Constitutional: Negative for fever, chills, appetite change, fatigue and unexpected weight change.  Eyes: Negative for visual disturbance.  Respiratory: Negative for shortness of breath.   Cardiovascular: Negative for chest pain and leg swelling.  Gastrointestinal: Negative for nausea, vomiting, abdominal pain, diarrhea and constipation.  Skin: Positive for rash. Negative for color change.  Hematological: Negative for adenopathy. Does not bruise/bleed easily.  Psychiatric/Behavioral: Negative for dysphoric mood. The patient is not nervous/anxious.        Objective:    BP 136/85 mmHg  Pulse 88  Temp(Src) 98.3 F (36.8 C) (Oral)  Ht 5' 3.5" (1.613 m)  Wt 214 lb 8 oz (97.297 kg)  BMI 37.40 kg/m2  SpO2 97% Physical Exam  Constitutional: She is oriented to person, place, and time. She appears well-developed and well-nourished. No distress.  HENT:  Head: Normocephalic and atraumatic.  Right Ear: External ear normal.  Left Ear: External ear normal.  Nose: Nose normal.  Mouth/Throat: Oropharynx is clear and moist. No oropharyngeal exudate.  Eyes: Conjunctivae are normal. Pupils are equal, round, and reactive to light. Right eye exhibits no discharge. Left eye exhibits no discharge. No scleral icterus.  Neck: Normal range of  motion. Neck supple. No tracheal deviation present. No thyromegaly present.  Cardiovascular: Normal rate, regular rhythm, normal heart sounds and intact distal pulses.  Exam reveals no gallop and no friction rub.   No murmur heard. Pulmonary/Chest: Effort normal and breath sounds normal. No respiratory distress. She has no wheezes. She has no rales. She exhibits no tenderness.  Musculoskeletal: Normal range of motion. She exhibits no edema or tenderness.  Lymphadenopathy:    She has no cervical adenopathy.  Neurological: She is alert and oriented to person, place, and time. No cranial nerve deficit. She exhibits normal muscle tone. Coordination normal.  Skin: Skin is warm and dry. Rash noted. Rash is maculopapular (erythematous rash over forearms and lower legs). She is not diaphoretic. No erythema. No pallor.  Psychiatric: She has a normal mood and affect. Her behavior is normal. Judgment and thought content normal.          Assessment & Plan:   Problem List Items Addressed This Visit      Unprioritized   Diabetes mellitus, type 2 - Primary    Will check A1c with labs. Continue Metformin.      Relevant Orders   Comprehensive metabolic panel   Hemoglobin A1c   Microalbumin / creatinine urine ratio   HLD (hyperlipidemia)    Will check lipids and LFTs with labs.      Relevant Orders   Lipid panel   Hypothyroidism    Will check TSH with labs. Continue Levothyroxine.      Relevant Orders   TSH  Obesity    Wt Readings from Last 3 Encounters:  10/04/14 214 lb 8 oz (97.297 kg)  07/19/14 213 lb 6.4 oz (96.798 kg)  05/09/14 204 lb 8 oz (92.761 kg)   Body mass index is 37.4 kg/(m^2). Encouraged efforts at healthy diet and exercise.      Solar dermatitis    Exam is c/w solar dermatitis. Will monitor for now as improving. Discussed adding topical steroid if symptoms persistent.          Return in about 3 months (around 01/04/2015) for Recheck of Diabetes.

## 2014-10-04 NOTE — Progress Notes (Signed)
Pre visit review using our clinic review tool, if applicable. No additional management support is needed unless otherwise documented below in the visit note. 

## 2014-10-04 NOTE — Assessment & Plan Note (Signed)
Will check lipids and LFTs with labs.

## 2014-10-04 NOTE — Assessment & Plan Note (Signed)
Wt Readings from Last 3 Encounters:  10/04/14 214 lb 8 oz (97.297 kg)  07/19/14 213 lb 6.4 oz (96.798 kg)  05/09/14 204 lb 8 oz (92.761 kg)   Body mass index is 37.4 kg/(m^2). Encouraged efforts at healthy diet and exercise.

## 2014-10-04 NOTE — Patient Instructions (Signed)
Labs today.   Follow up in 3 months.  

## 2014-10-04 NOTE — Assessment & Plan Note (Signed)
Exam is c/w solar dermatitis. Will monitor for now as improving. Discussed adding topical steroid if symptoms persistent.

## 2014-10-05 ENCOUNTER — Encounter: Payer: Self-pay | Admitting: Internal Medicine

## 2014-10-13 ENCOUNTER — Other Ambulatory Visit: Payer: Self-pay | Admitting: Internal Medicine

## 2014-11-28 ENCOUNTER — Other Ambulatory Visit: Payer: Self-pay | Admitting: Internal Medicine

## 2014-11-29 MED ORDER — VALACYCLOVIR HCL 500 MG PO TABS: 500.0000 mg | ORAL_TABLET | Freq: Two times a day (BID) | ORAL | Status: DC

## 2014-11-29 NOTE — Addendum Note (Signed)
Addended by: Vernetta Honey on: 11/29/2014 02:41 PM   Modules accepted: Orders

## 2015-01-05 ENCOUNTER — Ambulatory Visit: Payer: BLUE CROSS/BLUE SHIELD | Admitting: Internal Medicine

## 2015-01-27 ENCOUNTER — Ambulatory Visit: Payer: BLUE CROSS/BLUE SHIELD | Admitting: Internal Medicine

## 2015-01-27 ENCOUNTER — Telehealth: Payer: Self-pay | Admitting: Internal Medicine

## 2015-01-27 DIAGNOSIS — Z0289 Encounter for other administrative examinations: Secondary | ICD-10-CM

## 2015-01-27 NOTE — Telephone Encounter (Signed)
FYI, Pt husband called about not able to make appt. Pt is sick. Appt is still on the schedule. Thank you!

## 2015-04-14 ENCOUNTER — Other Ambulatory Visit: Payer: Self-pay | Admitting: Internal Medicine

## 2015-04-14 NOTE — Telephone Encounter (Signed)
Lisinopril and metformin filled

## 2015-05-03 ENCOUNTER — Ambulatory Visit: Payer: BLUE CROSS/BLUE SHIELD | Admitting: Internal Medicine

## 2015-05-16 ENCOUNTER — Encounter: Payer: Self-pay | Admitting: Internal Medicine

## 2015-05-16 ENCOUNTER — Ambulatory Visit (INDEPENDENT_AMBULATORY_CARE_PROVIDER_SITE_OTHER): Payer: BLUE CROSS/BLUE SHIELD | Admitting: Internal Medicine

## 2015-05-16 ENCOUNTER — Other Ambulatory Visit: Payer: Self-pay | Admitting: Internal Medicine

## 2015-05-16 VITALS — BP 128/83 | HR 84 | Temp 97.7°F | Wt 222.0 lb

## 2015-05-16 DIAGNOSIS — E559 Vitamin D deficiency, unspecified: Secondary | ICD-10-CM | POA: Diagnosis not present

## 2015-05-16 DIAGNOSIS — E039 Hypothyroidism, unspecified: Secondary | ICD-10-CM | POA: Diagnosis not present

## 2015-05-16 DIAGNOSIS — E119 Type 2 diabetes mellitus without complications: Secondary | ICD-10-CM | POA: Diagnosis not present

## 2015-05-16 DIAGNOSIS — E785 Hyperlipidemia, unspecified: Secondary | ICD-10-CM | POA: Diagnosis not present

## 2015-05-16 DIAGNOSIS — N959 Unspecified menopausal and perimenopausal disorder: Secondary | ICD-10-CM | POA: Insufficient documentation

## 2015-05-16 LAB — COMPREHENSIVE METABOLIC PANEL
ALT: 131 U/L — ABNORMAL HIGH (ref 0–35)
AST: 78 U/L — ABNORMAL HIGH (ref 0–37)
Albumin: 4.5 g/dL (ref 3.5–5.2)
Alkaline Phosphatase: 99 U/L (ref 39–117)
BUN: 13 mg/dL (ref 6–23)
CO2: 22 mEq/L (ref 19–32)
Calcium: 9.7 mg/dL (ref 8.4–10.5)
Chloride: 103 mEq/L (ref 96–112)
Creatinine, Ser: 0.76 mg/dL (ref 0.40–1.20)
GFR: 83.67 mL/min (ref >60.00)
Glucose, Bld: 225 mg/dL — ABNORMAL HIGH (ref 70–99)
Potassium: 4.4 mEq/L (ref 3.5–5.1)
Sodium: 138 mEq/L (ref 135–145)
Total Bilirubin: 0.4 mg/dL (ref 0.2–1.2)
Total Protein: 6.9 g/dL (ref 6.0–8.3)

## 2015-05-16 LAB — HEMOGLOBIN A1C: Hgb A1c MFr Bld: 9.4 % — ABNORMAL HIGH (ref 4.6–6.5)

## 2015-05-16 LAB — TSH: TSH: 1.55 u[IU]/mL (ref 0.35–4.50)

## 2015-05-16 LAB — VITAMIN D 25 HYDROXY (VIT D DEFICIENCY, FRACTURES): VITD: 7.07 ng/mL — ABNORMAL LOW (ref 30.00–100.00)

## 2015-05-16 MED ORDER — PAROXETINE HCL 10 MG PO TABS: 10.0000 mg | ORAL_TABLET | Freq: Every day | ORAL | Status: DC

## 2015-05-16 MED ORDER — SAXAGLIPTIN HCL 2.5 MG PO TABS: 2.5000 mg | ORAL_TABLET | Freq: Every day | ORAL | Status: DC

## 2015-05-16 MED ORDER — ERGOCALCIFEROL 1.25 MG (50000 UT) PO CAPS: 50000.0000 [IU] | ORAL_CAPSULE | ORAL | Status: DC

## 2015-05-16 NOTE — Assessment & Plan Note (Signed)
LFTs with labs today. Plan for fasting lipids at next annual exam.

## 2015-05-16 NOTE — Progress Notes (Signed)
Subjective:    Patient ID: GENESE QUEBEDEAUX, female    DOB: 1960-10-08, 55 y.o.   MRN: [id number]  HPI  55YO female presents for follow up.  DM - BG well controlled near 100.   Having frequent hot flashes. Unbearable at times. Uses fan at work.  Having some muscle cramping in feet at night.  Wt Readings from Last 3 Encounters:  05/16/15 222 lb (100.699 kg)  10/04/14 214 lb 8 oz (97.297 kg)  07/19/14 213 lb 6.4 oz (96.798 kg)   BP Readings from Last 3 Encounters:  05/16/15 128/83  10/04/14 136/85  07/19/14 138/88    Past Medical History  Diagnosis Date  . Diabetes mellitus without complication ([place] [place])   . GERD (gastroesophageal reflux disease)   . Hypothyroidism   . History of chicken pox    Family History  Problem Relation Age of Onset  . Hypertension Mother   . Diabetes Mother   . Cancer Mother 91    GBM  . Hyperlipidemia Father   . Heart disease Father     MI  . Hypertension Father   . Diabetes Father   . Depression Father   . COPD Father   . Diabetes Maternal Aunt     Uncontrolled with complications  . Kidney disease Maternal Aunt     Renal failure - dialysis  . Stroke Maternal Grandmother   . Cancer Maternal Grandfather     liver  . Heart disease Paternal Grandmother     MI  . Diabetes Paternal Grandmother   . Prostate cancer Paternal Grandfather   . Cancer Paternal Grandfather     prostate cancer  . Cancer Maternal Uncle     brain tumor   Past Surgical History  Procedure Laterality Date  . Cholecystectomy  2005  . Appendectomy  1970  . Bunionectomy Right 07/2001    right foot  . Right oophorectomy Right 03/2003    Abscessed cyst removed  . Ectopic pregnancy surgery  1995   Social History   Social History  . Marital Status: Married    Spouse Name: N/A  . Number of Children: 0  . Years of Education: 16   Occupational History  . Customer Service Manager     [employer]   Social History Main Topics  . Smoking status: Never  Smoker   . Smokeless tobacco: None  . Alcohol Use: No  . Drug Use: No  . Sexual Activity: Not Asked   Other Topics Concern  . None   Social History Narrative   Lives in [place].    Works for [employer].   Sarabella grew up in [place]. She lives in [place] with her husband [name]) and their 3 cat ([name], Nesbitt, Goodridge).    She attended [employer] and obtained her Bachelors in [employer] with a minor in Youth worker.    She loves cooking and sewing.    Review of Systems  Constitutional: Positive for diaphoresis. Negative for fever, chills, appetite change, fatigue and unexpected weight change.  Eyes: Negative for visual disturbance.  Respiratory: Negative for cough and shortness of breath.   Cardiovascular: Negative for chest pain and leg swelling.  Gastrointestinal: Negative for nausea, vomiting, abdominal pain, diarrhea and constipation.  Endocrine: Positive for heat intolerance.  Musculoskeletal: Positive for myalgias. Negative for arthralgias.  Skin: Negative for color change and rash.  Hematological: Negative for adenopathy. Does not bruise/bleed easily.  Psychiatric/Behavioral: Positive for sleep disturbance. Negative for dysphoric  mood. The patient is not nervous/anxious.        Objective:    BP 128/83 mmHg  Pulse 84  Temp(Src) 97.7 F (36.5 C) (Oral)  Wt 222 lb (100.699 kg)  SpO2 99% Physical Exam  Constitutional: She is oriented to person, place, and time. She appears well-developed and well-nourished. No distress.  HENT:  Head: Normocephalic and atraumatic.  Right Ear: External ear normal.  Left Ear: External ear normal.  Nose: Nose normal.  Mouth/Throat: Oropharynx is clear and moist. No oropharyngeal exudate.  Eyes: Conjunctivae are normal. Pupils are equal, round, and reactive to light. Right eye exhibits no discharge. Left eye exhibits no discharge. No scleral icterus.  Neck: Normal range of motion. Neck supple. No tracheal  deviation present. No thyromegaly present.  Cardiovascular: Normal rate, regular rhythm, normal heart sounds and intact distal pulses.  Exam reveals no gallop and no friction rub.   No murmur heard. Pulmonary/Chest: Effort normal and breath sounds normal. No respiratory distress. She has no wheezes. She has no rales. She exhibits no tenderness.  Musculoskeletal: Normal range of motion. She exhibits no edema or tenderness.  Lymphadenopathy:    She has no cervical adenopathy.  Neurological: She is alert and oriented to person, place, and time. No cranial nerve deficit. She exhibits normal muscle tone. Coordination normal.  Skin: Skin is warm and dry. No rash noted. She is not diaphoretic. No erythema. No pallor.  Psychiatric: She has a normal mood and affect. Her behavior is normal. Judgment and thought content normal.          Assessment & Plan:   Problem List Items Addressed This Visit      Unprioritized   Diabetes mellitus, type 2 ([place]) - Primary    Will check A1c with labs. Continue Metformin.      Relevant Orders   Comprehensive metabolic panel   Hemoglobin A1c   HLD (hyperlipidemia)    LFTs with labs today. Plan for fasting lipids at next annual exam.      Hypothyroidism    Recheck TSH with labs today.      Relevant Orders   TSH   Menopausal disorder    Discussed some options to help improve hot flashes. Will start Paroxetine 68m daily. Follow up recheck 4 weeks.      Relevant Medications   PARoxetine (PAXIL) 10 MG tablet   Vitamin D deficiency    Will recheck VIt D with labs today.      Relevant Orders   VITAMIN D 25 Hydroxy (Vit-D Deficiency, Fractures)       Return in about 4 weeks (around 06/13/2015).  JRonette Deter MD Internal Medicine [facility]

## 2015-05-16 NOTE — Assessment & Plan Note (Signed)
Discussed some options to help improve hot flashes. Will start Paroxetine 57m daily. Follow up recheck 4 weeks.

## 2015-05-16 NOTE — Assessment & Plan Note (Signed)
Recheck TSH with labs today.

## 2015-05-16 NOTE — Patient Instructions (Signed)
Start Vit D 2000units daily, taken in the morning.  Labs today.  Start Paxil 67m daily to help with hot flashes.

## 2015-05-16 NOTE — Progress Notes (Signed)
Pre visit review using our clinic review tool, if applicable. No additional management support is needed unless otherwise documented below in the visit note. 

## 2015-05-16 NOTE — Assessment & Plan Note (Signed)
Will recheck VIt D with labs today.

## 2015-05-16 NOTE — Assessment & Plan Note (Signed)
Will check A1c with labs. Continue Metformin.

## 2015-05-19 ENCOUNTER — Telehealth: Payer: Self-pay

## 2015-05-19 NOTE — Telephone Encounter (Signed)
OK. She will just need to give this some time. Based on her A1c, her blood sugars have been elevated. Continue Metformin and Onglyza. Limit intake of sugars.  We can set up a follow up for next week. 45mn

## 2015-05-19 NOTE — Telephone Encounter (Signed)
Please ask her to recheck blood sugar in about 2 hr

## 2015-05-19 NOTE — Telephone Encounter (Signed)
Notified pt of Dr.Walkers comment, pt verbalized understanding and appreciation. Pt is scheduled to see you 05/23/15 at 7am, I advised pt to keep a log through out the weekend of what her BG's ar and what she is eating through out the day.

## 2015-05-19 NOTE — Telephone Encounter (Signed)
Pt states that her BG 326 8 am this morning, pt states that she took her metformin st 7:30am. Pt states that she has not started taking her saxagliptin HCl (ONGLYZA) 2.5 MG TABS tablet, the pharmacy had to order it. Pt is nervous b/c she has never had a reading this high. Please advise, thanks

## 2015-05-19 NOTE — Telephone Encounter (Signed)
Notified pt. 

## 2015-05-19 NOTE — Telephone Encounter (Signed)
Spoke with pt she states that she is in route to p/u the Onglyza to take, pt want to know how long after taking meds should she wait to test her sugar again. Please advise, thanks

## 2015-05-19 NOTE — Telephone Encounter (Signed)
Pt states that her BG only went down to 311. Please advise, thanks

## 2015-05-19 NOTE — Telephone Encounter (Signed)
We could try to get Onglyza from another pharmacy? Or we can call in insulin for her to use.

## 2015-05-21 ENCOUNTER — Other Ambulatory Visit: Payer: Self-pay | Admitting: Internal Medicine

## 2015-05-23 ENCOUNTER — Ambulatory Visit (INDEPENDENT_AMBULATORY_CARE_PROVIDER_SITE_OTHER): Payer: BLUE CROSS/BLUE SHIELD | Admitting: Internal Medicine

## 2015-05-23 ENCOUNTER — Encounter: Payer: Self-pay | Admitting: Internal Medicine

## 2015-05-23 VITALS — BP 138/90 | HR 90 | Ht 63.5 in | Wt 214.0 lb

## 2015-05-23 DIAGNOSIS — R109 Unspecified abdominal pain: Secondary | ICD-10-CM | POA: Diagnosis not present

## 2015-05-23 DIAGNOSIS — R7989 Other specified abnormal findings of blood chemistry: Secondary | ICD-10-CM | POA: Diagnosis not present

## 2015-05-23 DIAGNOSIS — E669 Obesity, unspecified: Secondary | ICD-10-CM | POA: Diagnosis not present

## 2015-05-23 DIAGNOSIS — E1165 Type 2 diabetes mellitus with hyperglycemia: Secondary | ICD-10-CM

## 2015-05-23 DIAGNOSIS — IMO0001 Reserved for inherently not codable concepts without codable children: Secondary | ICD-10-CM

## 2015-05-23 DIAGNOSIS — R945 Abnormal results of liver function studies: Secondary | ICD-10-CM

## 2015-05-23 LAB — COMPREHENSIVE METABOLIC PANEL
ALT: 183 U/L — ABNORMAL HIGH (ref 0–35)
AST: 106 U/L — ABNORMAL HIGH (ref 0–37)
Albumin: 4.5 g/dL (ref 3.5–5.2)
Alkaline Phosphatase: 87 U/L (ref 39–117)
BUN: 14 mg/dL (ref 6–23)
CO2: 24 mEq/L (ref 19–32)
Calcium: 9.8 mg/dL (ref 8.4–10.5)
Chloride: 101 mEq/L (ref 96–112)
Creatinine, Ser: 0.78 mg/dL (ref 0.40–1.20)
GFR: 81.2 mL/min (ref >60.00)
Glucose, Bld: 202 mg/dL — ABNORMAL HIGH (ref 70–99)
Potassium: 3.9 mEq/L (ref 3.5–5.1)
Sodium: 137 mEq/L (ref 135–145)
Total Bilirubin: 0.6 mg/dL (ref 0.2–1.2)
Total Protein: 7.2 g/dL (ref 6.0–8.3)

## 2015-05-23 LAB — POCT URINALYSIS DIPSTICK
Blood, UA: NEGATIVE
Glucose, UA: NEGATIVE
Ketones, UA: 40
Nitrite, UA: NEGATIVE
Protein, UA: 0.2
Spec Grav, UA: >=1.03
Urobilinogen, UA: NEGATIVE
pH, UA: 5

## 2015-05-23 LAB — LIPASE: Lipase: 27 U/L (ref 11.0–59.0)

## 2015-05-23 MED ORDER — SAXAGLIPTIN HCL 5 MG PO TABS: 5.0000 mg | ORAL_TABLET | Freq: Every day | ORAL | Status: DC

## 2015-05-23 NOTE — Assessment & Plan Note (Signed)
BG continue to be elevated. Will increase Onglyza to 49m daily. Email update in 2 days. Follow up in 3-4 weeks.

## 2015-05-23 NOTE — Assessment & Plan Note (Signed)
Marked increase in LFTs. 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 Will repeat LFTs with labs today.

## 2015-05-23 NOTE — Assessment & Plan Note (Signed)
Wt Readings from Last 3 Encounters:  05/23/15 214 lb (97.07 kg)  05/16/15 222 lb (100.699 kg)  10/04/14 214 lb 8 oz (97.297 kg)   Congratulated pt on weight loss. Encouraged healthy diet and exercise.

## 2015-05-23 NOTE — Assessment & Plan Note (Signed)
Right flank pain. Most consistent with muscular strain, however will check UA today.

## 2015-05-23 NOTE — Progress Notes (Signed)
Subjective:    Patient ID: Courtney Burns, female    DOB: 06-Jul-1960, 55 y.o.   MRN: [id number]  HPI  55YO female presents for follow up.  DM  - Recently found to have elevated BG on labs. Started on Onglyza 2.24m daily. BG much higher, fasting this morning 326. Following a healthy diet, low in processed carbs.  Right flank pain - Intermittent over last several weeks. Described as soreness. Comes and goes. Worse with movement. No GI or urinary symptoms. No fever, chills.  Lab Results  Component Value Date   HGBA1C 9.4* 05/16/2015     Wt Readings from Last 3 Encounters:  05/23/15 214 lb (97.07 kg)  05/16/15 222 lb (100.699 kg)  10/04/14 214 lb 8 oz (97.297 kg)   BP Readings from Last 3 Encounters:  05/23/15 138/90  05/16/15 128/83  10/04/14 136/85    Past Medical History  Diagnosis Date  . Diabetes mellitus [place] complication ([place]   . GERD (gastroesophageal reflux disease)   . Hypothyroidism   . History of chicken pox    Family History  Problem Relation Age of Onset  . Hypertension Mother   . Diabetes Mother   . Cancer Mother 524   GBM  . Hyperlipidemia Father   . Heart disease Father     MI  . Hypertension Father   . Diabetes Father   . Depression Father   . COPD Father   . Diabetes Maternal Aunt     Uncontrolled with complications  . Kidney disease Maternal Aunt     Renal failure - dialysis  . Stroke Maternal Grandmother   . Cancer Maternal Grandfather     liver  . Heart disease Paternal Grandmother     MI  . Diabetes Paternal Grandmother   . Prostate cancer Paternal Grandfather   . Cancer Paternal Grandfather     prostate cancer  . Cancer Maternal Uncle     brain tumor   Past Surgical History  Procedure Laterality Date  . Cholecystectomy  2005  . Appendectomy  1970  . Bunionectomy Right 07/2001    right foot  . Right oophorectomy Right 03/2003    Abscessed cyst removed  . Ectopic pregnancy surgery  1995   Social History   Social  History  . Marital Status: Married    Spouse Name: N/A  . Number of Children: 0  . Years of Education: 16   Occupational History  . Customer Service Manager     [employer]  Social History Main Topics  . Smoking status: Never Smoker   . Smokeless tobacco: None  . Alcohol Use: No  . Drug Use: No  . Sexual Activity: Not Asked   Other Topics Concern  . None   Social History Narrative   [place]    Works for [employer]   [name] and their 3 cat ([name], [place].    She [employer] obtained her Bachelors in [place] [employer] a minor in PYouth worker    She loves cooking and sewing.    Review of Systems  Constitutional: Negative for fever, chills, appetite change, fatigue and unexpected weight change.  Eyes: Negative for visual disturbance.  Respiratory: Negative for shortness of breath.   Cardiovascular: Negative for chest pain, palpitations and leg swelling.  Gastrointestinal: Negative for nausea, vomiting, abdominal pain, diarrhea and constipation.  Genitourinary:  Positive for flank pain. Negative for dysuria, urgency and frequency.  Skin: Negative for color change and rash.  Hematological: Negative for adenopathy. Does not bruise/bleed easily.  Psychiatric/Behavioral: Negative for sleep disturbance and dysphoric mood. The patient is nervous/anxious.        Objective:    BP 138/90 mmHg  Pulse 90  Ht 5' 3.5" (1.613 m)  Wt 214 lb (97.07 kg)  BMI 37.31 kg/m2 Physical Exam  Constitutional: She is oriented to person, place, and time. She appears well-developed and well-nourished. No distress.  HENT:  Head: Normocephalic and atraumatic.  Right Ear: External ear normal.  Left Ear: External ear normal.  Nose: Nose normal.  Mouth/Throat: Oropharynx is clear and moist. No oropharyngeal exudate.  Eyes: Conjunctivae are normal. Pupils are  equal, round, and reactive to light. Right eye exhibits no discharge. Left eye exhibits no discharge. No scleral icterus.  Neck: Normal range of motion. Neck supple. No tracheal deviation present. No thyromegaly present.  Cardiovascular: Normal rate, regular rhythm, normal heart sounds and intact distal pulses.  Exam reveals no gallop and no friction rub.   No murmur heard. Pulmonary/Chest: Effort normal and breath sounds normal. No respiratory distress. She has no wheezes. She has no rales. She exhibits no tenderness.  Musculoskeletal: Normal range of motion. She exhibits no edema or tenderness.  Lymphadenopathy:    She has no cervical adenopathy.  Neurological: She is alert and oriented to person, place, and time. No cranial nerve deficit. She exhibits normal muscle tone. Coordination normal.  Skin: Skin is warm and dry. No rash noted. She is not diaphoretic. No erythema. No pallor.  Psychiatric: She has a normal mood and affect. Her behavior is normal. Judgment and thought content normal.          Assessment & Plan:   Problem List Items Addressed This Visit      Unprioritized   Diabetes type 2, uncontrolled ([place]) - Primary    BG continue to be elevated. Will increase Onglyza to 10m daily. Email update in 2 days. Follow up in 3-4 weeks.      Relevant Medications   saxagliptin HCl (ONGLYZA) 5 MG TABS tablet   Elevated liver function tests    Marked increase in LFTs. [place]dom[place] in 2015 showed NASH. Will repeat LFTs with labs today.      Relevant Orders   Comprehensive metabolic panel   Obesity    Wt Readings from Last 3 Encounters:  05/23/15 214 lb (97.07 kg)  05/16/15 222 lb (100.699 kg)  10/04/14 214 lb 8 oz (97.297 kg)   Congratulated pt on weight loss. Encouraged healthy diet and exercise.      Relevant Medications   saxagliptin HCl (ONGLYZA) 5 MG TABS tablet   Right flank pain    Right flank pain. Most consistent with muscular strain, however will check UA today.        Relevant Orders   POCT Urinalysis Dipstick       Return in about 2 weeks (around 06/06/2015) for Recheck.  JRonette Deter MD Internal Medicine [facility]

## 2015-05-23 NOTE — Addendum Note (Signed)
Addended by: Ronette Deter A on: 05/23/2015 03:06 PM   Modules accepted: Orders

## 2015-05-23 NOTE — Progress Notes (Signed)
Pre visit review using our clinic review tool, if applicable. No additional management support is needed unless otherwise documented below in the visit note. 

## 2015-05-23 NOTE — Patient Instructions (Addendum)
Try to limit carbohydrate intake to 35gm with meal and 15gm with a snack.  Increase Onglyza to 56m daily.  Goal fasting blood sugars 80-120.  Email with update Thursday.

## 2015-05-23 NOTE — Addendum Note (Signed)
Addended by: Ronette Deter A on: 05/23/2015 07:38 AM   Modules accepted: Orders

## 2015-05-24 ENCOUNTER — Other Ambulatory Visit (INDEPENDENT_AMBULATORY_CARE_PROVIDER_SITE_OTHER): Payer: BLUE CROSS/BLUE SHIELD

## 2015-05-24 ENCOUNTER — Encounter: Payer: Self-pay | Admitting: Internal Medicine

## 2015-05-24 ENCOUNTER — Other Ambulatory Visit: Payer: Self-pay | Admitting: Internal Medicine

## 2015-05-24 DIAGNOSIS — R7989 Other specified abnormal findings of blood chemistry: Secondary | ICD-10-CM | POA: Diagnosis not present

## 2015-05-24 DIAGNOSIS — R945 Abnormal results of liver function studies: Principal | ICD-10-CM

## 2015-05-24 LAB — AMMONIA: Ammonia: 44 umol/L (ref 16–53)

## 2015-05-24 MED ORDER — GLUCOSE BLOOD VI STRP
ORAL_STRIP | Status: DC
Start: 2015-05-24 — End: 2016-05-15

## 2015-05-25 ENCOUNTER — Encounter: Payer: Self-pay | Admitting: Internal Medicine

## 2015-05-25 LAB — HEPATITIS A ANTIBODY, IGM: Hep A IgM: NONREACTIVE

## 2015-05-25 LAB — HEPATITIS B SURFACE ANTIBODY,QUALITATIVE: Hep B S Ab: NEGATIVE

## 2015-05-25 LAB — TSH: TSH: 1.22 u[IU]/mL (ref 0.35–4.50)

## 2015-05-25 LAB — CMV IGM: CMV IgM: <8 AU/mL (ref <30.00)

## 2015-05-25 LAB — ANA: Anti Nuclear Antibody(ANA): NEGATIVE

## 2015-05-25 LAB — HSV(HERPES SIMPLEX VRS) I + II AB-IGG
HSV 1 Glycoprotein G Ab, IgG: 2.63 Index — ABNORMAL HIGH (ref <0.90)
HSV 2 Glycoprotein G Ab, IgG: 7.93 Index — ABNORMAL HIGH (ref <0.90)

## 2015-05-25 LAB — IGG, IGA, IGM
IgA: 194 mg/dL (ref 69–380)
IgG (Immunoglobin G), Serum: 632 mg/dL — ABNORMAL LOW (ref 690–1700)
IgM, Serum: 26 mg/dL — ABNORMAL LOW (ref 52–322)

## 2015-05-25 LAB — HIV ANTIBODY (ROUTINE TESTING W REFLEX): HIV 1&2 Ab, 4th Generation: NONREACTIVE

## 2015-05-25 LAB — HEPATITIS C ANTIBODY: HCV Ab: NEGATIVE

## 2015-05-25 LAB — PROTIME-INR
INR: 1.1 ratio — ABNORMAL HIGH (ref 0.8–1.0)
Prothrombin Time: 11.4 s (ref 9.6–13.1)

## 2015-05-25 LAB — HEPATITIS B SURFACE ANTIGEN: Hepatitis B Surface Ag: NEGATIVE

## 2015-05-25 LAB — HEPATITIS B CORE ANTIBODY, TOTAL: Hep B Core Total Ab: NONREACTIVE

## 2015-05-26 ENCOUNTER — Encounter: Payer: Self-pay | Admitting: Internal Medicine

## 2015-05-26 ENCOUNTER — Other Ambulatory Visit: Payer: BLUE CROSS/BLUE SHIELD

## 2015-05-26 ENCOUNTER — Ambulatory Visit (INDEPENDENT_AMBULATORY_CARE_PROVIDER_SITE_OTHER): Payer: BLUE CROSS/BLUE SHIELD | Admitting: Internal Medicine

## 2015-05-26 VITALS — BP 136/74 | HR 104 | Temp 99.4°F | Ht 63.5 in | Wt 214.5 lb

## 2015-05-26 DIAGNOSIS — E1165 Type 2 diabetes mellitus with hyperglycemia: Secondary | ICD-10-CM | POA: Diagnosis not present

## 2015-05-26 DIAGNOSIS — IMO0001 Reserved for inherently not codable concepts without codable children: Secondary | ICD-10-CM

## 2015-05-26 DIAGNOSIS — R945 Abnormal results of liver function studies: Secondary | ICD-10-CM

## 2015-05-26 DIAGNOSIS — R7989 Other specified abnormal findings of blood chemistry: Secondary | ICD-10-CM

## 2015-05-26 LAB — ANTI-SMOOTH MUSCLE ANTIBODY, IGG: Smooth Muscle Ab: <20 U (ref <20)

## 2015-05-26 LAB — CERULOPLASMIN: Ceruloplasmin: 30 mg/dL (ref 18–53)

## 2015-05-26 NOTE — Progress Notes (Signed)
Pre visit review using our clinic review tool, if applicable. No additional management support is needed unless otherwise documented below in the visit note. 

## 2015-05-26 NOTE — Assessment & Plan Note (Signed)
BG improving on lower dose of Onglyza. Will continue. Follow up next week.

## 2015-05-26 NOTE — Assessment & Plan Note (Signed)
Evaluation in process with labs pending for viral hepatitis, Wilson's, auto-immune hepatitis. CT abdomen pending for Tuesday next week. Follow up here next week.

## 2015-05-26 NOTE — Patient Instructions (Signed)
Continue Onglyza.  Email this weekend with blood sugars.

## 2015-05-26 NOTE — Progress Notes (Signed)
Subjective:    Patient ID: Courtney Burns, female    DOB: June 25, 1960, 55 y.o.   MRN: [id number]  HPI  55YO female presents for follow up.  DM - Recently started on Onglyza. BG improved somewhat. 188 last night. 200 this morning. Following healthy diet.   Feeling well. Occasional loose stools, but no blood in stool. No fever, chills. Some aching right upper abdominal pain, which has been chronic for months. She feels this is muscular in nature and exacerbated by prolonged sitting.    Wt Readings from Last 3 Encounters:  05/26/15 214 lb 8 oz (97.297 kg)  05/23/15 214 lb (97.07 kg)  05/16/15 222 lb (100.699 kg)   BP Readings from Last 3 Encounters:  05/26/15 136/74  05/23/15 138/90  05/16/15 128/83    Past Medical History  Diagnosis Date  . Diabetes mellitus without complication ([place])   . GERD (gastroesophageal reflux disease)   . Hypothyroidism   . History of chicken pox    Family History  Problem Relation Age of Onset  . Hypertension Mother   . Diabetes Mother   . Cancer Mother 68    GBM  . Hyperlipidemia Father   . Heart disease Father     MI  . Hypertension Father   . Diabetes Father   . Depression Father   . COPD Father   . Diabetes Maternal Aunt     Uncontrolled with complications  . Kidney disease Maternal Aunt     Renal failure - dialysis  . Stroke Maternal Grandmother   . Cancer Maternal Grandfather     liver  . Heart disease Paternal Grandmother     MI  . Diabetes Paternal Grandmother   . Prostate cancer Paternal Grandfather   . Cancer Paternal Grandfather     prostate cancer  . Cancer Maternal Uncle     brain tumor   Past Surgical History  Procedure Laterality Date  . Cholecystectomy  2005  . Appendectomy  1970  . Bunionectomy Right 07/2001    right foot  . Right oophorectomy Right 03/2003    Abscessed cyst removed  . Ectopic pregnancy surgery  1995   Social History   Social History  . Marital Status: Married    Spouse Name: N/A    . Number of Children: 0  . Years of Education: 16   Occupational History  . Customer Service Manager     [employer]   Social History Main Topics  . Smoking status: Never Smoker   . Smokeless tobacco: None  . Alcohol Use: No  . Drug Use: No  . Sexual Activity: Not Asked   Other Topics Concern  . None   Social History Narrative   Lives in [place].    Works for [employer].   Tulip grew up in [place]. She lives in [place] with her husband [name]) and their 3 cat ([name], Arp, Hamilton).    She attended [employer] and obtained her Bachelors in [employer] with a minor in Youth worker.    She loves cooking and sewing.    Review of Systems  Constitutional: Negative for fever, chills, appetite change, fatigue and unexpected weight change.  Eyes: Negative for visual disturbance.  Respiratory: Negative for shortness of breath.   Cardiovascular: Negative for chest pain and leg swelling.  Gastrointestinal: Positive for diarrhea. Negative for nausea, vomiting, constipation and abdominal distention.  Musculoskeletal: Negative for myalgias and arthralgias.  Skin: Negative for color change and  rash.  Hematological: Negative for adenopathy. Does not bruise/bleed easily.  Psychiatric/Behavioral: Negative for sleep disturbance and dysphoric mood. The patient is not nervous/anxious.        Objective:    BP 136/74 mmHg  Pulse 104  Temp(Src) 99.4 F (37.4 C) (Oral)  Ht 5' 3.5" (1.613 m)  Wt 214 lb 8 oz (97.297 kg)  BMI 37.40 kg/m2  SpO2 96% Physical Exam  Constitutional: She is oriented to person, place, and time. She appears well-developed and well-nourished. No distress.  HENT:  Head: Normocephalic and atraumatic.  Right Ear: External ear normal.  Left Ear: External ear normal.  Nose: Nose normal.  Mouth/Throat: Oropharynx is clear and moist. No oropharyngeal exudate.  Eyes: Conjunctivae and EOM are normal. Pupils are equal, round,  and reactive to light. Right eye exhibits no discharge.  Neck: Normal range of motion. Neck supple. No thyromegaly present.  Cardiovascular: Normal rate, regular rhythm, normal heart sounds and intact distal pulses.  Exam reveals no gallop and no friction rub.   No murmur heard. Pulmonary/Chest: Effort normal. No respiratory distress. She has no wheezes. She has no rales.  Abdominal: Soft. Bowel sounds are normal. She exhibits no distension and no mass. There is no tenderness. There is no rebound and no guarding.  Musculoskeletal: Normal range of motion. She exhibits no edema or tenderness.  Lymphadenopathy:    She has no cervical adenopathy.  Neurological: She is alert and oriented to person, place, and time. No cranial nerve deficit. Coordination normal.  Skin: Skin is warm and dry. No rash noted. She is not diaphoretic. No erythema. No pallor.  Psychiatric: She has a normal mood and affect. Her behavior is normal. Judgment and thought content normal.          Assessment & Plan:   Problem List Items Addressed This Visit      Unprioritized   Diabetes type 2, uncontrolled ([place]) - Primary    BG improving on lower dose of Onglyza. Will continue. Follow up next week.      Elevated liver function tests    Evaluation in process with labs pending for viral hepatitis, Wilson's, auto-immune hepatitis. CT abdomen pending for Tuesday next week. Follow up here next week.          Return in about 1 week (around 06/02/2015) for Recheck.  Ronette Deter, MD Internal Medicine [facility]

## 2015-05-27 LAB — ANTI-MICROSOMAL ANTIBODY LIVER / KIDNEY: LKM1 Ab: <=20 U (ref <=20.0)

## 2015-05-28 ENCOUNTER — Encounter: Payer: Self-pay | Admitting: Internal Medicine

## 2015-05-30 ENCOUNTER — Ambulatory Visit
Admission: RE | Admit: 2015-05-30 | Discharge: 2015-05-30 | Disposition: A | Discharge status: Home / Self Care | Payer: BLUE CROSS/BLUE SHIELD | Source: Ambulatory Visit | Attending: Internal Medicine | Admitting: Internal Medicine

## 2015-05-30 ENCOUNTER — Other Ambulatory Visit: Payer: Self-pay | Admitting: Internal Medicine

## 2015-05-30 DIAGNOSIS — R109 Unspecified abdominal pain: Secondary | ICD-10-CM

## 2015-05-30 DIAGNOSIS — R938 Abnormal findings on diagnostic imaging of other specified body structures: Secondary | ICD-10-CM | POA: Diagnosis not present

## 2015-05-30 DIAGNOSIS — K760 Fatty (change of) liver, not elsewhere classified: Secondary | ICD-10-CM | POA: Diagnosis not present

## 2015-05-30 DIAGNOSIS — K50018 Crohn's disease of small intestine with other complication: Secondary | ICD-10-CM

## 2015-05-30 MED ORDER — IOHEXOL 350 MG/ML SOLN
100.0000 mL | Freq: Once | INTRAVENOUS | Status: AC | PRN
  Administered 2015-05-30: 100 mL via INTRAVENOUS

## 2015-06-01 ENCOUNTER — Ambulatory Visit: Payer: BLUE CROSS/BLUE SHIELD | Admitting: Internal Medicine

## 2015-06-05 ENCOUNTER — Encounter: Payer: Self-pay | Admitting: Internal Medicine

## 2015-06-06 ENCOUNTER — Ambulatory Visit: Payer: BLUE CROSS/BLUE SHIELD | Admitting: Internal Medicine

## 2015-06-08 ENCOUNTER — Ambulatory Visit (AMBULATORY_SURGERY_CENTER): Payer: Self-pay | Admitting: *Deleted

## 2015-06-08 VITALS — Ht 64.0 in | Wt 214.0 lb

## 2015-06-08 DIAGNOSIS — R109 Unspecified abdominal pain: Secondary | ICD-10-CM

## 2015-06-08 DIAGNOSIS — Z86010 Personal history of colonic polyps: Secondary | ICD-10-CM

## 2015-06-08 MED ORDER — SUPREP BOWEL PREP KIT 17.5-3.13-1.6 GM/177ML PO SOLN: 1.0000 | Freq: Once | ORAL | Status: DC

## 2015-06-08 NOTE — Progress Notes (Signed)
Patient denies any allergies to egg or soy products. Patient denies complications with anesthesia/sedation.  Patient denies oxygen use at home and denies diet medications. Emmi instructions for colonoscopy explained but patient denied.

## 2015-06-13 ENCOUNTER — Ambulatory Visit (AMBULATORY_SURGERY_CENTER): Payer: BLUE CROSS/BLUE SHIELD | Admitting: Internal Medicine

## 2015-06-13 ENCOUNTER — Encounter: Payer: Self-pay | Admitting: Internal Medicine

## 2015-06-13 VITALS — BP 135/86 | HR 80 | Temp 97.8°F | Resp 16 | Ht 64.0 in | Wt 214.0 lb

## 2015-06-13 DIAGNOSIS — Z1211 Encounter for screening for malignant neoplasm of colon: Secondary | ICD-10-CM | POA: Diagnosis present

## 2015-06-13 DIAGNOSIS — R1031 Right lower quadrant pain: Secondary | ICD-10-CM

## 2015-06-13 DIAGNOSIS — K639 Disease of intestine, unspecified: Secondary | ICD-10-CM

## 2015-06-13 DIAGNOSIS — K5000 Crohn's disease of small intestine without complications: Secondary | ICD-10-CM | POA: Diagnosis not present

## 2015-06-13 MED ORDER — SODIUM CHLORIDE 0.9 % IV SOLN: 500.0000 mL | INTRAVENOUS | Status: DC

## 2015-06-13 NOTE — Patient Instructions (Signed)
YOU HAD AN ENDOSCOPIC PROCEDURE TODAY AT [facility]:   Refer to the procedure report that was given to you for any specific questions about what was found during the examination.  If the procedure report does not answer your questions, please call your gastroenterologist to clarify.  If you requested that your care partner not be given the details of your procedure findings, then the procedure report has been included in a sealed envelope for you to review at your convenience later.  YOU SHOULD EXPECT: Some feelings of bloating in the abdomen. Passage of more gas than usual.  Walking can help get rid of the air that was put into your GI tract during the procedure and reduce the bloating. If you had a lower endoscopy (such as a colonoscopy or flexible sigmoidoscopy) you may notice spotting of blood in your stool or on the toilet paper. If you underwent a bowel prep for your procedure, you may not have a normal bowel movement for a few days.  Please Note:  You might notice some irritation and congestion in your nose or some drainage.  This is from the oxygen used during your procedure.  There is no need for concern and it should clear up in a day or so.  SYMPTOMS TO REPORT IMMEDIATELY:   Following lower endoscopy (colonoscopy or flexible sigmoidoscopy):  Excessive amounts of blood in the stool  Significant tenderness or worsening of abdominal pains  Swelling of the abdomen that is new, acute  Fever of 100F or higher   For urgent or emergent issues, a gastroenterologist can be reached at any hour by calling [phone].   DIET: Your first meal following the procedure should be a small meal and then it is ok to progress to your normal diet. Heavy or fried foods are harder to digest and may make you feel nauseous or bloated.  Likewise, meals heavy in dairy and vegetables can increase bloating.  Drink plenty of fluids but you should avoid alcoholic beverages for 24  hours.  ACTIVITY:  You should plan to take it easy for the rest of today and you should NOT DRIVE or use heavy machinery until tomorrow (because of the sedation medicines used during the test).    FOLLOW UP: Our staff will call the number listed on your records the next business day following your procedure to check on you and address any questions or concerns that you may have regarding the information given to you following your procedure. If we do not reach you, we will leave a message.  However, if you are feeling well and you are not experiencing any problems, there is no need to return our call.  We will assume that you have returned to your regular daily activities without incident.  If any biopsies were taken you will be contacted by phone or by letter within the next 1-3 weeks.  Please call us at [phone] if you have not heard about the biopsies in 3 weeks.    SIGNATURES/CONFIDENTIALITY: You and/or your care partner have signed paperwork which will be entered into your electronic medical record.  These signatures attest to the fact that that the information above on your After Visit Summary has been reviewed and is understood.  Full responsibility of the confidentiality of this discharge information lies with you and/or your care-partner.  Diverticulosis, high fiber diet information given.  No ibuprofen, naproxen, or other non steroidal anti inflammatory medications.

## 2015-06-13 NOTE — Progress Notes (Signed)
Called to room to assist during endoscopic procedure.  Patient ID and intended procedure confirmed with present staff. Received instructions for my participation in the procedure from the performing physician.  

## 2015-06-13 NOTE — Op Note (Signed)
[facility] Patient Name: Courtney Burns Procedure Date: 06/13/2015 8:24 AM MRN: [id number] Endoscopist: Jerene Bears , MD Age: 55 Referring MD:  Date of Birth: 09/30/1960 Gender: Female Procedure:                Colonoscopy Indications:              Screening for colorectal malignant neoplasm, Last                            colonoscopy 10 years agoterminal ileum Medicines:                Monitored Anesthesia Care Procedure:                Pre-Anesthesia Assessment:                           - Prior to the procedure, a History and Physical                            was performed, and patient medications and                            allergies were reviewed. The patient's tolerance of                            previous anesthesia was also reviewed. The risks                            and benefits of the procedure and the sedation                            options and risks were discussed with the patient.                            All questions were answered, and informed consent                            was obtained. Prior Anticoagulants: The patient has                            taken no previous anticoagulant or antiplatelet                            agents. ASA Grade Assessment: II - A patient with                            mild systemic disease. After reviewing the risks                            and benefits, the patient was deemed in                            satisfactory condition to undergo the procedure.  After obtaining informed consent, the colonoscope                            was passed under direct vision. Throughout the                            procedure, the patient's blood pressure, pulse, and                            oxygen saturations were monitored continuously. The                            Model PCF-H190L [id number]) scope was introduced                            through the anus and advanced to the the  terminal                            ileum. The colonoscopy was performed without                            difficulty. The patient tolerated the procedure                            well. The quality of the bowel preparation was                            good. The ileocecal valve, appendiceal orifice, and                            rectum were photographed. Scope In: 8:36:53 AM Scope Out: 8:52:44 AM Scope Withdrawal Time: 0 hours 13 minutes 56 seconds  Total Procedure Duration: 0 hours 15 minutes 51 seconds  Findings:      The digital rectal exam was normal.      The ileum, 2-3 cm from the ileocecal valve contained a benign-appearing,       intrinsic moderate stenosis measuring 1-2 cm (in length) that was       non-traversed. There was inflammatory appearing polypoid mucosal changes       at the narrowing. I was unable to see past the narrowed segment.       Multiple biopsies were obtained with cold forceps for histology in the       distal ileum at the stricture and the polypoid mucosal change.      A few small-mouthed diverticula were found in the sigmoid colon.      The exam was otherwise without abnormality on direct and retroflexion       views. No evidence for colitis Complications:            No immediate complications. Estimated Blood Loss:     Estimated blood loss was minimal. 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 Polypoid mucosal changes at the TI                            narrowing.                           - Diverticulosis in the sigmoid colon.                           - The examination was otherwise normal on direct                            and retroflexion views.                           - Multiple biopsies were obtained in the distal                            ileum. Recommendation:           - Patient has a contact number available for                            emergencies. The  signs and symptoms of potential                            delayed complications were discussed with the                            patient. Return to normal activities tomorrow.                            Written discharge instructions were provided to the                            patient.                           - Resume previous diet.                           - Continue present medications.                           - Await pathology results.                           - Repeat colonoscopy is recommended. The                            colonoscopy date will be determined after pathology                            results from today's exam become available for                            review.                           -  No ibuprofen, naproxen, or other non-steroidal                            anti-inflammatory drugs. Procedure Code(s):        --- Professional ---                           [phone], Colonoscopy, flexible; with biopsy, single                            or multiple CPT copyright 2016 American Medical Association. All rights reserved. Jerene Bears, MD 06/13/2015 9:12:50 AM This report has been signed electronically. Number of Addenda: 0 Referring MD:      Eduard Clos. Gilford Rile, MD

## 2015-06-13 NOTE — Progress Notes (Signed)
A and o x3 Report to RN

## 2015-06-14 ENCOUNTER — Telehealth: Payer: Self-pay

## 2015-06-14 ENCOUNTER — Encounter: Payer: Self-pay | Admitting: Internal Medicine

## 2015-06-14 NOTE — Telephone Encounter (Signed)
Left a message at [phone] for the pt to call us back if any questions or concerns. maw

## 2015-06-15 ENCOUNTER — Ambulatory Visit: Payer: BLUE CROSS/BLUE SHIELD | Admitting: Internal Medicine

## 2015-06-26 ENCOUNTER — Encounter: Payer: Self-pay | Admitting: Internal Medicine

## 2015-06-27 ENCOUNTER — Telehealth: Payer: Self-pay | Admitting: Internal Medicine

## 2015-06-27 NOTE — Telephone Encounter (Signed)
Pt calling for biopsy result. Please advise.

## 2015-06-28 NOTE — Telephone Encounter (Signed)
I called and spoke to patient by phone regarding biopsy results. I also spoke to Dr. Orene Desanctis with pathology regarding pathology results --Pathology result shows active ileitis without granuloma or definitive chronicity. Viral stains were negative  Fia, has been following closely with Dr. Gilford Rile in the last several months specifically because of fairly acute increase in overall blood sugars as well as liver enzymes. Her A1c went from 7.1-9.4 without great explanation Dr. Gilford Rile ordered a battery of liver tests all of which were unremarkable. CT scan showed fatty liver and terminal ileitis Colonoscopy showed terminal ileitis with stricture/stenosis which was biopsied as discussed above  The patient states that she has been having some left middle and upper quadrant pain under her rib cage and also in her right side over the last 3-4 months. This is not constant and comes and goes. She has had fairly regular bowel habit though for the last several years her bowel movements have been different after gallbladder surgery. She describes her stools as mostly formed but occasionally loose. On average 2 per day but she's had 3 stools today already. She denies blood in her stool or melena. She has noticed that her buccal mucosa has been more sensitive and felt "weird" for the last several months. She's discussed this with dermatology without definitive diagnosis. She has also noticed increased fatigue in the last several months. With the elevated blood sugar she started Onglyza which slowly has been improving blood sugars. She's been monitoring them at home as well as keeping a food journal.  Assessment: Ileitis, which in my opinion is most likely Crohn's disease. Infectious etiologies would have been expected to cause more diarrhea. Stains also negative for virus. No evidence for malignancy --Check CBC, CMP, ESR and high sensitivity CRP. Also send IBD extended panel which may provide more insight/evidence to  support Crohn's (order labs at [facility]) --Began budesonide in the form of Entocort 9 mg daily 8 weeks --Follow-up with me in the office in 6-8 weeks --Add bowel movement and overall general feeling to food and blood glucose journal already being kept  Kyisha was asked to call me with any questions or concerns that may arise before follow-up. She voiced understanding and thanked me for the call

## 2015-06-29 ENCOUNTER — Other Ambulatory Visit: Payer: Self-pay

## 2015-06-29 DIAGNOSIS — K50919 Crohn's disease, unspecified, with unspecified complications: Secondary | ICD-10-CM

## 2015-06-29 MED ORDER — BUDESONIDE 3 MG PO CPEP: 9.0000 mg | ORAL_CAPSULE | Freq: Every day | ORAL | Status: DC

## 2015-06-29 NOTE — Telephone Encounter (Signed)
Lab orders in epic, script sent to pharmacy and pt scheduled for OV 09/04/15@4pm . Left message for pt to call back.

## 2015-06-29 NOTE — Telephone Encounter (Signed)
Spoke with pt and she is aware.

## 2015-07-04 ENCOUNTER — Encounter: Payer: Self-pay | Admitting: Internal Medicine

## 2015-07-05 ENCOUNTER — Other Ambulatory Visit (INDEPENDENT_AMBULATORY_CARE_PROVIDER_SITE_OTHER): Payer: BLUE CROSS/BLUE SHIELD

## 2015-07-05 DIAGNOSIS — K50919 Crohn's disease, unspecified, with unspecified complications: Secondary | ICD-10-CM

## 2015-07-05 LAB — SEDIMENTATION RATE: Sed Rate: 9 mm/hr (ref 0–22)

## 2015-07-05 LAB — CBC WITH DIFFERENTIAL/PLATELET
Basophils Absolute: 0.1 10*3/uL (ref 0.0–0.1)
Basophils Relative: 0.5 % (ref 0.0–3.0)
Eosinophils Absolute: 0.1 10*3/uL (ref 0.0–0.7)
Eosinophils Relative: 1.2 % (ref 0.0–5.0)
HCT: 42.8 % (ref 36.0–46.0)
Hemoglobin: 14.1 g/dL (ref 12.0–15.0)
Lymphocytes Relative: 16.6 % (ref 12.0–46.0)
Lymphs Abs: 1.9 10*3/uL (ref 0.7–4.0)
MCHC: 33.1 g/dL (ref 30.0–36.0)
MCV: 88.8 fl (ref 78.0–100.0)
Monocytes Absolute: 0.9 10*3/uL (ref 0.1–1.0)
Monocytes Relative: 8 % (ref 3.0–12.0)
Neutro Abs: 8.3 10*3/uL — ABNORMAL HIGH (ref 1.4–7.7)
Neutrophils Relative %: 73.7 % (ref 43.0–77.0)
Platelets: 389 10*3/uL (ref 150.0–400.0)
RBC: 4.82 Mil/uL (ref 3.87–5.11)
RDW: 14.4 % (ref 11.5–15.5)
WBC: 11.2 10*3/uL — ABNORMAL HIGH (ref 4.0–10.5)

## 2015-07-05 LAB — HIGH SENSITIVITY CRP: CRP, High Sensitivity: 5.61 mg/L — ABNORMAL HIGH (ref 0.000–5.000)

## 2015-07-05 LAB — COMPREHENSIVE METABOLIC PANEL
ALT: 86 U/L — ABNORMAL HIGH (ref 0–35)
AST: 39 U/L — ABNORMAL HIGH (ref 0–37)
Albumin: 4.6 g/dL (ref 3.5–5.2)
Alkaline Phosphatase: 89 U/L (ref 39–117)
BUN: 16 mg/dL (ref 6–23)
CO2: 30 mEq/L (ref 19–32)
Calcium: 9.6 mg/dL (ref 8.4–10.5)
Chloride: 103 mEq/L (ref 96–112)
Creatinine, Ser: 0.79 mg/dL (ref 0.40–1.20)
GFR: 79.98 mL/min (ref >60.00)
Glucose, Bld: 137 mg/dL — ABNORMAL HIGH (ref 70–99)
Potassium: 4.3 mEq/L (ref 3.5–5.1)
Sodium: 140 mEq/L (ref 135–145)
Total Bilirubin: 0.4 mg/dL (ref 0.2–1.2)
Total Protein: 6.7 g/dL (ref 6.0–8.3)

## 2015-07-06 LAB — IBD EXPANDED PANEL
ACCA: 13 units (ref 0–90)
ALCA: 8 units (ref 0–60)
AMCA: 8 units (ref 0–100)
Atypical pANCA: NEGATIVE
gASCA: 2 units (ref 0–50)

## 2015-07-27 ENCOUNTER — Encounter: Payer: Self-pay | Admitting: Internal Medicine

## 2015-08-03 ENCOUNTER — Encounter: Payer: Self-pay | Admitting: *Deleted

## 2015-09-04 ENCOUNTER — Encounter: Payer: Self-pay | Admitting: Internal Medicine

## 2015-09-04 ENCOUNTER — Ambulatory Visit (INDEPENDENT_AMBULATORY_CARE_PROVIDER_SITE_OTHER): Payer: BLUE CROSS/BLUE SHIELD | Admitting: Internal Medicine

## 2015-09-04 ENCOUNTER — Other Ambulatory Visit (INDEPENDENT_AMBULATORY_CARE_PROVIDER_SITE_OTHER): Payer: BLUE CROSS/BLUE SHIELD

## 2015-09-04 VITALS — BP 110/74 | HR 100 | Ht 62.5 in | Wt 203.5 lb

## 2015-09-04 DIAGNOSIS — K529 Noninfective gastroenteritis and colitis, unspecified: Secondary | ICD-10-CM

## 2015-09-04 DIAGNOSIS — K760 Fatty (change of) liver, not elsewhere classified: Secondary | ICD-10-CM

## 2015-09-04 LAB — COMPREHENSIVE METABOLIC PANEL
ALT: 40 U/L — ABNORMAL HIGH (ref 0–35)
AST: 21 U/L (ref 0–37)
Albumin: 4.5 g/dL (ref 3.5–5.2)
Alkaline Phosphatase: 81 U/L (ref 39–117)
BUN: 13 mg/dL (ref 6–23)
CO2: 30 mEq/L (ref 19–32)
Calcium: 9.5 mg/dL (ref 8.4–10.5)
Chloride: 102 mEq/L (ref 96–112)
Creatinine, Ser: 0.78 mg/dL (ref 0.40–1.20)
GFR: 81.11 mL/min (ref >60.00)
Glucose, Bld: 107 mg/dL — ABNORMAL HIGH (ref 70–99)
Potassium: 4.4 mEq/L (ref 3.5–5.1)
Sodium: 138 mEq/L (ref 135–145)
Total Bilirubin: 0.4 mg/dL (ref 0.2–1.2)
Total Protein: 7 g/dL (ref 6.0–8.3)

## 2015-09-04 LAB — CBC WITH DIFFERENTIAL/PLATELET
Basophils Absolute: 0 10*3/uL (ref 0.0–0.1)
Basophils Relative: 0.5 % (ref 0.0–3.0)
Eosinophils Absolute: 0.2 10*3/uL (ref 0.0–0.7)
Eosinophils Relative: 1.9 % (ref 0.0–5.0)
HCT: 43.2 % (ref 36.0–46.0)
Hemoglobin: 14.4 g/dL (ref 12.0–15.0)
Lymphocytes Relative: 28.1 % (ref 12.0–46.0)
Lymphs Abs: 2.6 10*3/uL (ref 0.7–4.0)
MCHC: 33.4 g/dL (ref 30.0–36.0)
MCV: 88.3 fl (ref 78.0–100.0)
Monocytes Absolute: 0.8 10*3/uL (ref 0.1–1.0)
Monocytes Relative: 8.7 % (ref 3.0–12.0)
Neutro Abs: 5.6 10*3/uL (ref 1.4–7.7)
Neutrophils Relative %: 60.8 % (ref 43.0–77.0)
Platelets: 380 10*3/uL (ref 150.0–400.0)
RBC: 4.89 Mil/uL (ref 3.87–5.11)
RDW: 13.9 % (ref 11.5–15.5)
WBC: 9.2 10*3/uL (ref 4.0–10.5)

## 2015-09-04 LAB — C-REACTIVE PROTEIN: CRP: 0.9 mg/dL (ref 0.5–20.0)

## 2015-09-04 NOTE — Patient Instructions (Signed)
Your physician has requested that you go to the basement for the following lab work before leaving today: CRP, CBC, CMP  Follow up with Dr Hilarie Fredrickson in October 2017.  Remain OFF of Entocort.  Call our office should you begin having loose bowels, abdominal pain or other major changes.  If you are age 56 or older, your body mass index should be between 23-30. Your Body mass index is 36.6 kg/(m^2). If this is out of the aforementioned range listed, please consider follow up with your Primary Care Provider.  If you are age 36 or younger, your body mass index should be between 19-25. Your Body mass index is 36.6 kg/(m^2). If this is out of the aformentioned range listed, please consider follow up with your Primary Care Provider.

## 2015-09-04 NOTE — Progress Notes (Signed)
Patient ID: Courtney Burns, female   DOB: May 11, 1960, 55 y.o.   MRN: [id number] HPI: Courtney Burns is a 55 year old female seen in follow-up for ileitis. She also has a history of obesity, diabetes, hypothyroidism, hypertension and vitamin D deficiency. She is here alone today. She came for colonoscopy for screening and to evaluate abnormal GI imaging. She developed right flank and right upper quadrant pain which led to a CT scan in March 2017. CT scan of the abdomen and pelvis showed thickening in the terminal ileum involving the distal 10 cm. The scan also showed fatty liver and was otherwise unremarkable. Colonoscopy was performed on 06/13/2015. This showed ileitis with moderate intrinsic stenosis in the terminal ileum. Multiple biopsies were obtained. There were a few small diverticuli in the sigmoid colon and the exam was otherwise unremarkable. The biopsies from the ileum showed focal active ileitis. Viral stains were negative.  I discussed this with her by phone after the biopsy results. We placed her on an 8 week course of budesonide 9 mg daily. We checked a CRP which was elevated at 5.6. In IBD extended panel through [facility] was not consistent with IBD.  She has worked on her blood sugars and diet, and exercise. She's been able to successfully lose 25 pounds all of which is been intentional. She feels much better. She plans to lose 40 more pounds.  She states that with the budesonide there is been a dramatic improvement in her bowel function and bowel movements. Her stools which were previously loose and urgent are now formed and brown. They're occurring twice daily on a very regular basis. She's had no further urgent stools. All of her right-sided abdominal pain is gone. She has been limiting fried foods fats and carbohydrates. He does still have an element of fatigue and tiredness.  Past Medical History  Diagnosis Date  . Diabetes mellitus without complication ([place])   . Hypothyroidism   .  History of chicken pox   . Vitamin D deficiency   . HSV infection   . GERD (gastroesophageal reflux disease)     Hx - diet controlled,  no meds  . Hypertension   . Ileitis     Past Surgical History  Procedure Laterality Date  . Cholecystectomy  2005  . Appendectomy  1970  . Bunionectomy Right 07/2001    right foot  . Right oophorectomy Right 03/2003    Abscessed cyst removed  . Ectopic pregnancy surgery  1995  . Colonoscopy  01/2004    Dr. Minette Headland, [place] - polyp  . Wisdom tooth extraction      Outpatient Prescriptions Prior to Visit  Medication Sig Dispense Refill  . glucose blood test strip Use as instructed 100 each 12  . levothyroxine (SYNTHROID, LEVOTHROID) 75 MCG tablet TAKE 1 TABLET (75 MCG TOTAL) BY MOUTH EVERY MORNING. 90 tablet 3  . lisinopril (PRINIVIL,ZESTRIL) 5 MG tablet TAKE 1 TABLET BY MOUTH EVERY DAY 90 tablet 3  . metFORMIN (GLUCOPHAGE) 1000 MG tablet TAKE 1 TABLET (1,000 MG TOTAL) BY MOUTH 2 (TWO) TIMES DAILY WITH A MEAL. 180 tablet 3  . saxagliptin HCl (ONGLYZA) 5 MG TABS tablet Take 1 tablet (5 mg total) by mouth daily. 30 tablet 3  . budesonide (ENTOCORT EC) 3 MG 24 hr capsule Take 3 capsules (9 mg total) by mouth daily. 90 capsule 1  . valACYclovir (VALTREX) 500 MG tablet Take 1 tablet (500 mg total) by mouth 2 (two) times daily. (Patient not taking: Reported on  09/04/2015) 6 tablet 0  . ergocalciferol (VITAMIN D2) 50000 units capsule Take 1 capsule (50,000 Units total) by mouth once a week. 12 capsule 0  . PARoxetine (PAXIL) 10 MG tablet Take 1 tablet (10 mg total) by mouth daily. (Patient not taking: Reported on 06/13/2015) 30 tablet 6   No facility-administered medications prior to visit.    Allergies  Allergen Reactions  . Sulfa Antibiotics Rash    Family History  Problem Relation Age of Onset  . Hypertension Mother   . Diabetes Mother   . Cancer Mother 74    GBM  . Hyperlipidemia Father   . Heart disease Father     MI  . Hypertension  Father   . Diabetes Father   . Depression Father   . COPD Father   . Diabetes Maternal Aunt     Uncontrolled with complications  . Kidney disease Maternal Aunt     Renal failure - dialysis  . Stroke Maternal Grandmother   . Cancer Maternal Grandfather     liver  . Heart disease Paternal Grandmother     MI  . Diabetes Paternal Grandmother   . Prostate cancer Paternal Grandfather   . Cancer Paternal Grandfather     prostate cancer  . Cancer Maternal Uncle     brain tumor  . Colon cancer Neg Hx   . Esophageal cancer Neg Hx   . Rectal cancer Neg Hx   . Stomach cancer Neg Hx     Social History  Substance Use Topics  . Smoking status: Never Smoker   . Smokeless tobacco: Never Used  . Alcohol Use: No    ROS: As per history of present illness, otherwise negative  BP 110/74 mmHg  Pulse 100  Ht 5' 2.5" (1.588 m)  Wt 203 lb 8 oz (92.307 kg)  BMI 36.60 kg/m2  LMP 07/07/2013 Constitutional: Well-developed and well-nourished. No distress. HEENT: Normocephalic and atraumatic. Oropharynx is clear and moist. No oropharyngeal exudate. Conjunctivae are normal.  No scleral icterus. Neck: Neck supple. Trachea midline. Cardiovascular: Normal rate, regular rhythm and intact distal pulses. No M/R/G Pulmonary/chest: Effort normal and breath sounds normal. No wheezing, rales or rhonchi. Abdominal: Soft, nontender, nondistended. Bowel sounds active throughout. There are no masses palpable. No hepatosplenomegaly. Extremities: no clubbing, cyanosis, or edema Lymphadenopathy: No cervical adenopathy noted. Neurological: Alert and oriented to person place and time. Skin: Skin is warm and dry. No rashes noted. Psychiatric: Normal mood and affect. Behavior is normal.  RELEVANT LABS AND IMAGING: CBC    Component Value Date/Time   WBC 9.2 09/04/2015 1657   RBC 4.89 09/04/2015 1657   HGB 14.4 09/04/2015 1657   HCT 43.2 09/04/2015 1657   PLT 380.0 09/04/2015 1657   MCV 88.3 09/04/2015 1657    MCHC 33.4 09/04/2015 1657   RDW 13.9 09/04/2015 1657   LYMPHSABS 2.6 09/04/2015 1657   MONOABS 0.8 09/04/2015 1657   EOSABS 0.2 09/04/2015 1657   BASOSABS 0.0 09/04/2015 1657    CMP     Component Value Date/Time   NA 138 09/04/2015 1657   K 4.4 09/04/2015 1657   CL 102 09/04/2015 1657   CO2 30 09/04/2015 1657   GLUCOSE 107* 09/04/2015 1657   BUN 13 09/04/2015 1657   CREATININE 0.78 09/04/2015 1657   CALCIUM 9.5 09/04/2015 1657   PROT 7.0 09/04/2015 1657   ALBUMIN 4.5 09/04/2015 1657   AST 21 09/04/2015 1657   ALT 40* 09/04/2015 1657   ALKPHOS 81 09/04/2015 1657  BILITOT 0.4 09/04/2015 1657   Results for Courtney, Burns (MRN [id number]) as of 09/04/2015 17:43  Ref. Range 05/16/2015 10:31 05/23/2015 08:04 05/24/2015 16:02 05/24/2015 16:04 07/05/2015 11:03 09/04/2015 16:57  Alkaline Phosphatase Latest Ref Range: 39-117 U/L 99 87   89 81  Albumin Latest Ref Range: 3.5-5.2 g/dL 4.5 4.5   4.6 4.5  Lipase Latest Ref Range: 11.0-59.0 U/L  27.0      AST Latest Ref Range: 0-37 U/L 78 (H) 106 (H)   39 (H) 21  ALT Latest Ref Range: 0-35 U/L 131 (H) 183 (H)   86 (H) 40 (H)  Total Protein Latest Ref Range: 6.0-8.3 g/dL 6.9 7.2   6.7 7.0  Ammonia Latest Ref Range: 16-53 umol/L    44    Total Bilirubin Latest Ref Range: 0.2-1.2 mg/dL 0.4 0.6   0.4 0.4    ASSESSMENT/PLAN: 55 year old female seen in follow-up for ileitis. She also has a history of obesity, diabetes, hypothyroidism, hypertension and vitamin D deficiency  1. 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 She has responded very favorably to budesonide as one would expect in Crohn's disease. Her bowel movements have become more formed and less urgent. Also her right-sided abdominal pain has resolved. Perhaps weight loss has also contributed to improvement in her right-sided abdominal pain. --Repeat CBC, CMP  and CRP today --Given her improvement we are going to leave her off of budesonide and assess her response. If she develops recurrent right-sided abdominal pain, loose urgent stools and she will likely need escalation of therapy for Crohn's disease. We discussed retreatment with budesonide should symptoms recur but also escalation of therapy to involve a maintenance medication such as biologic or immunomodulator.  She is happy with this plan.  2. Fatty liver and elevated liver enzymes -- repeat liver enzymes today. She's lost 20 pounds and I expect her enzymes have improved. She will continue with diet, exercise and weight loss plan. As discussed in #1 some of her right upper quadrant pain could've been secondary to fatty liver steatohepatitis with liver capsule stretching.  Follow-up in October, sooner if necessary 25 minutes spent with the patient today. Greater than 50% was spent in counseling and coordination of care with the patient     JO:ITGPQDIY A Walker, [place] [address] [place], [place] [address]

## 2015-09-18 ENCOUNTER — Other Ambulatory Visit: Payer: Self-pay

## 2015-09-18 MED ORDER — SAXAGLIPTIN HCL 5 MG PO TABS: 5.0000 mg | ORAL_TABLET | Freq: Every day | ORAL | Status: DC

## 2015-09-28 ENCOUNTER — Encounter: Payer: Self-pay | Admitting: Internal Medicine

## 2015-10-08 ENCOUNTER — Encounter: Payer: Self-pay | Admitting: Internal Medicine

## 2015-10-08 DIAGNOSIS — N950 Postmenopausal bleeding: Secondary | ICD-10-CM

## 2015-11-07 ENCOUNTER — Ambulatory Visit: Payer: BLUE CROSS/BLUE SHIELD | Admitting: Primary Care

## 2015-11-07 ENCOUNTER — Encounter: Payer: Self-pay | Admitting: Primary Care

## 2015-11-07 ENCOUNTER — Ambulatory Visit (INDEPENDENT_AMBULATORY_CARE_PROVIDER_SITE_OTHER): Payer: BLUE CROSS/BLUE SHIELD | Admitting: Primary Care

## 2015-11-07 VITALS — BP 132/80 | HR 84 | Temp 98.2°F | Ht 63.0 in | Wt 197.1 lb

## 2015-11-07 DIAGNOSIS — R7989 Other specified abnormal findings of blood chemistry: Secondary | ICD-10-CM

## 2015-11-07 DIAGNOSIS — E1165 Type 2 diabetes mellitus with hyperglycemia: Secondary | ICD-10-CM

## 2015-11-07 DIAGNOSIS — E669 Obesity, unspecified: Secondary | ICD-10-CM

## 2015-11-07 DIAGNOSIS — E039 Hypothyroidism, unspecified: Secondary | ICD-10-CM

## 2015-11-07 DIAGNOSIS — E119 Type 2 diabetes mellitus without complications: Secondary | ICD-10-CM | POA: Diagnosis not present

## 2015-11-07 DIAGNOSIS — E785 Hyperlipidemia, unspecified: Secondary | ICD-10-CM

## 2015-11-07 DIAGNOSIS — N959 Unspecified menopausal and perimenopausal disorder: Secondary | ICD-10-CM

## 2015-11-07 DIAGNOSIS — IMO0001 Reserved for inherently not codable concepts without codable children: Secondary | ICD-10-CM

## 2015-11-07 DIAGNOSIS — R945 Abnormal results of liver function studies: Secondary | ICD-10-CM

## 2015-11-07 MED ORDER — METFORMIN HCL 1000 MG PO TABS: 500.0000 mg | ORAL_TABLET | Freq: Two times a day (BID) | ORAL | 3 refills | Status: DC

## 2015-11-07 NOTE — Assessment & Plan Note (Signed)
Weight loss of 30 pounds since March 2017. Commended her on these efforts and encouraged her to continue.

## 2015-11-07 NOTE — Assessment & Plan Note (Signed)
Needs repeat lipids, is not fasting today. Will obtain an upcoming visit for physical.

## 2015-11-07 NOTE — Assessment & Plan Note (Signed)
TSH in March 2017 stable. Continue thyroxine 75 g.

## 2015-11-07 NOTE — Assessment & Plan Note (Signed)
Felt lethargic on Paxil, has not taken in several months. Has appointment scheduled with GYN tomorrow for evaluation of postmenopausal bleeding that occurred 1 month ago.  Discussed treatment options for hot flashes, she declines at this time as her symptoms are manageable at this time.

## 2015-11-07 NOTE — Assessment & Plan Note (Signed)
Repeat LFTs in June stable. Follow-up scheduled in October 2017 with GI.

## 2015-11-07 NOTE — Progress Notes (Signed)
Pre visit review using our clinic review tool, if applicable. No additional management support is needed unless otherwise documented below in the visit note. 

## 2015-11-07 NOTE — Patient Instructions (Signed)
Complete lab work prior to leaving today. I will notify you of your results once received.   Congratulations on your weight loss!  Follow up with GYN as scheduled.  It was a pleasure to meet you today! Please don't hesitate to call me with any questions. Welcome to [facility]!

## 2015-11-07 NOTE — Progress Notes (Signed)
Subjective:    Patient ID: Courtney Burns, female    DOB: [birth year], 55 y.o.   MRN: [id number]  HPI  Courtney Burns is a 55 year old female who presents today to transfer care from [facility]. She is unsure of the date of her last complete physical exam.  1) Type 2 Diabetes: Diagnosed several years ago. Currently managed on Metformin 500 mg twice daily. Also managed on Lisinopril 5 mg for renal protection. Previously managed on Onglyza 5 mg, but weaned herself off as this medication was too expensive.   Last A1C was 9.4 in March 2017. She's lost 30 pounds since March 2017 through healthy diet and exercise. She is checking her blood sugars twice daily fasting. Her morning sugars are running 115-140 and evening sugars before dinner are 107-120. She is due for repeat A1C today. Denies numbness/tingling to feet. She's never had a pneumonia vaccination.  2) Hypothyroidism: Origionally diagnosed with hyperthyroidism and underwent radiation therapy. Currently managed on Levothyroxine 75 mcg. TSH in March 2017 stable.   3) Hot Flashes: Hot flashes for 3 years. Her hot flashes became worse while on an 8 week steriod treatment for likely Crohn's. Once she completed treatment she had cessation of hot flashes for 2 weeks. During that time period she noticed that her nipples were sore, pelvic cramping, and 2 days of pink discharge. She felt as though she was getting her period. Her LMP was 3 years ago. She's not since experienced any discharge/bleeding since that episode.  She was previously managed on Paxil for hot flashes which caused lethargy. She has an appointment with GYN next week for further evaluation of postmenopausal bleeding. Overall her hot flashes are manageable.  Review of Systems  Respiratory: Negative for shortness of breath.   Cardiovascular: Negative for chest pain.  Gastrointestinal: Negative for diarrhea and nausea.  Genitourinary:       Hot flashes  Neurological: Negative  for dizziness and numbness.       Past Medical History:  Diagnosis Date  . Diabetes mellitus without complication ([place])   . GERD (gastroesophageal reflux disease)    Hx - diet controlled,  no meds  . History of chicken pox   . HSV infection   . Hypertension   . Hypothyroidism   . Ileitis   . Vitamin D deficiency      Social History   Social History  . Marital status: Married    Spouse name: N/A  . Number of children: 0  . Years of education: 69   Occupational History  . 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 Smoking status: Never Smoker  . Smokeless tobacco: Never Used  . Alcohol use No  . Drug use: No  . Sexual activity: Yes    Birth control/ protection: Post-menopausal   Other Topics Concern  . Not on file   Social History Narrative   Lives in [place].    Works for [employer].   Ramsha grew up in [place]. She lives in [place] with her husband [name]) and their 3 cat ([name], [place], [place]).    She attended [employer] and obtained her Bachelors in [employer] with a minor in Youth worker.    She loves cooking and sewing.    Past Surgical History:  Procedure Laterality Date  . APPENDECTOMY  1970  . BUNIONECTOMY Right 07/2001   right foot  . CHOLECYSTECTOMY  2005  .  COLONOSCOPY  01/2004   Dr. Minette Headland, [place] - polyp  . ECTOPIC PREGNANCY SURGERY  1995  . RIGHT OOPHORECTOMY Right 03/2003   Abscessed cyst removed  . WISDOM TOOTH EXTRACTION      Family History  Problem Relation Age of Onset  . Hypertension Mother   . Diabetes Mother   . Cancer Mother 47    GBM  . Hyperlipidemia Father   . Heart disease Father     MI  . Hypertension Father   . Diabetes Father   . Depression Father   . COPD Father   . Diabetes Maternal Aunt     Uncontrolled with complications  . Kidney disease Maternal Aunt     Renal failure - dialysis  . Stroke Maternal Grandmother    . Cancer Maternal Grandfather     liver  . Heart disease Paternal Grandmother     MI  . Diabetes Paternal Grandmother   . Prostate cancer Paternal Grandfather   . Cancer Paternal Grandfather     prostate cancer  . Cancer Maternal Uncle     brain tumor  . Colon cancer Neg Hx   . Esophageal cancer Neg Hx   . Rectal cancer Neg Hx   . Stomach cancer Neg Hx     Allergies  Allergen Reactions  . Sulfa Antibiotics Rash    Current Outpatient Prescriptions on File Prior to Visit  Medication Sig Dispense Refill  . glucose blood test strip Use as instructed 100 each 12  . levothyroxine (SYNTHROID, LEVOTHROID) 75 MCG tablet TAKE 1 TABLET (75 MCG TOTAL) BY MOUTH EVERY MORNING. 90 tablet 3  . lisinopril (PRINIVIL,ZESTRIL) 5 MG tablet TAKE 1 TABLET BY MOUTH EVERY DAY 90 tablet 3  . metFORMIN (GLUCOPHAGE) 1000 MG tablet TAKE 1 TABLET (1,000 MG TOTAL) BY MOUTH 2 (TWO) TIMES DAILY WITH A MEAL. 180 tablet 3  . valACYclovir (VALTREX) 500 MG tablet Take 1 tablet (500 mg total) by mouth 2 (two) times daily. (Patient not taking: Reported on 11/07/2015) 6 tablet 0   No current facility-administered medications on file prior to visit.     BP 132/80   Pulse 84   Temp 98.2 F (36.8 C) (Oral)   Ht 5' 3"  (1.6 m)   Wt 197 lb 1.9 oz (89.4 kg)   LMP 07/07/2013   SpO2 96%   BMI 34.92 kg/m    Objective:   Physical Exam  Constitutional: She appears well-nourished.  Neck: Neck supple.  Cardiovascular: Normal rate.   Pulmonary/Chest: Effort normal and breath sounds normal.  Skin: Skin is warm and dry.  Psychiatric: She has a normal mood and affect.          Assessment & Plan:

## 2015-11-07 NOTE — Assessment & Plan Note (Signed)
Prior A1c of 9.4. Currently managed on metformin 500 mg twice a day. Weight loss of 30 pounds since last A1c. Commended her on her weight loss through improvements of diet and exercise. Repeat A1c and urine microalbumin due today. Recommended pneumonia vaccination for which she will think about. Managed on Ace. No statin. Will repeat lipids at upcoming physical.

## 2015-11-08 LAB — MICROALBUMIN / CREATININE URINE RATIO
Creatinine,U: 78.5 mg/dL
Microalb Creat Ratio: 0.9 mg/g (ref 0.0–30.0)
Microalb, Ur: <0.7 mg/dL (ref 0.0–1.9)

## 2015-11-08 LAB — HEMOGLOBIN A1C: Hgb A1c MFr Bld: 6.5 % (ref 4.6–6.5)

## 2015-12-13 ENCOUNTER — Other Ambulatory Visit (INDEPENDENT_AMBULATORY_CARE_PROVIDER_SITE_OTHER): Payer: BLUE CROSS/BLUE SHIELD

## 2015-12-13 ENCOUNTER — Ambulatory Visit (INDEPENDENT_AMBULATORY_CARE_PROVIDER_SITE_OTHER): Payer: BLUE CROSS/BLUE SHIELD | Admitting: Internal Medicine

## 2015-12-13 ENCOUNTER — Encounter: Payer: Self-pay | Admitting: Internal Medicine

## 2015-12-13 VITALS — BP 110/78 | HR 80 | Ht 62.0 in | Wt 192.2 lb

## 2015-12-13 DIAGNOSIS — K760 Fatty (change of) liver, not elsewhere classified: Secondary | ICD-10-CM | POA: Diagnosis not present

## 2015-12-13 DIAGNOSIS — K529 Noninfective gastroenteritis and colitis, unspecified: Secondary | ICD-10-CM

## 2015-12-13 LAB — COMPREHENSIVE METABOLIC PANEL
ALT: 30 U/L (ref 0–35)
AST: 19 U/L (ref 0–37)
Albumin: 4.1 g/dL (ref 3.5–5.2)
Alkaline Phosphatase: 75 U/L (ref 39–117)
BUN: 13 mg/dL (ref 6–23)
CO2: 26 mEq/L (ref 19–32)
Calcium: 9.2 mg/dL (ref 8.4–10.5)
Chloride: 106 mEq/L (ref 96–112)
Creatinine, Ser: 0.74 mg/dL (ref 0.40–1.20)
GFR: 86.11 mL/min (ref >60.00)
Glucose, Bld: 127 mg/dL — ABNORMAL HIGH (ref 70–99)
Potassium: 4.2 mEq/L (ref 3.5–5.1)
Sodium: 142 mEq/L (ref 135–145)
Total Bilirubin: 0.4 mg/dL (ref 0.2–1.2)
Total Protein: 6.9 g/dL (ref 6.0–8.3)

## 2015-12-13 NOTE — Patient Instructions (Signed)
Your physician has requested that you go to the basement for the following lab work before leaving today: CMP  Please follow up with Dr Hilarie Fredrickson in 1 year.  If you are age 56 or older, your body mass index should be between 23-30. Your Body mass index is 35.16 kg/m. If this is out of the aforementioned range listed, please consider follow up with your Primary Care Provider.  If you are age 92 or younger, your body mass index should be between 19-25. Your Body mass index is 35.16 kg/m. If this is out of the aformentioned range listed, please consider follow up with your Primary Care Provider.

## 2015-12-13 NOTE — Progress Notes (Signed)
Subjective:    Patient ID: Courtney Burns, female    DOB: 1960/07/22, 55 y.o.   MRN: [id number]  HPI Tariya Morrissette is a 55 year old female with a history of ileitis and fatty liver who is here for follow-up. She also has a history of diabetes, obesity, hypertension, hypothyroidism and vitamin D deficiency. She was last seen in the office on 09/04/2015.  She reports that she has been doing very well. She's had absolutely no recurrence of her right-sided abdominal pain. Her bowel movements have been regular without loose stools or constipation. No blood in her stools or melena. She has continued to work with her diet and nectar size program and has seen meaningful weight loss. She's lost about 30 pounds. Her A1c has dropped from 9.4-6.5. She's had no further fecal urgency. Her energy levels have also improved. She denies upper GI and hepatobiliary complaint today.  She had a colonoscopy performed on 06/13/2015 which showed ileitis with moderate intrinsic stenosis and the TI. Biopsies showed focal active ileitis with negative viral stains. Prior to this a CT scan also showed fatty liver and terminal ileitis.  Review of Systems As per history of present illness, otherwise negative  Current Medications, Allergies, Past Medical History, Past Surgical History, Family History and Social History were reviewed in [employer] record.     Objective:   Physical Exam BP 110/78   Pulse 80   Ht 5' 2"  (1.575 m)   Wt 192 lb 4 oz (87.2 kg)   LMP 07/07/2013   BMI 35.16 kg/m  Constitutional: Well-developed and well-nourished. No distress. HEENT: Normocephalic and atraumatic. Oropharynx is clear and moist. No oropharyngeal exudate. Conjunctivae are normal.  No scleral icterus. Neck: Neck supple. Trachea midline. Cardiovascular: Normal rate, regular rhythm and intact distal pulses. No M/R/G Pulmonary/chest: Effort normal and breath sounds normal. No wheezing, rales or  rhonchi. Abdominal: Soft, nontender, nondistended. Bowel sounds active throughout. There are no masses palpable. No hepatosplenomegaly. Extremities: no clubbing, cyanosis, or edema Lymphadenopathy: No cervical adenopathy noted. Neurological: Alert and oriented to person place and time. Skin: Skin is warm and dry. No rashes noted. Psychiatric: Normal mood and affect. Behavior is normal.  CMP     Component Value Date/Time   NA 142 12/13/2015 1006   K 4.2 12/13/2015 1006   CL 106 12/13/2015 1006   CO2 26 12/13/2015 1006   GLUCOSE 127 (H) 12/13/2015 1006   BUN 13 12/13/2015 1006   CREATININE 0.74 12/13/2015 1006   CALCIUM 9.2 12/13/2015 1006   PROT 6.9 12/13/2015 1006   ALBUMIN 4.1 12/13/2015 1006   AST 19 12/13/2015 1006   ALT 30 12/13/2015 1006   ALKPHOS 75 12/13/2015 1006   BILITOT 0.4 12/13/2015 1006        Assessment & Plan:  55 year old female with a history of ileitis and fatty liver who is here for follow-up.  1. Ileitis, Crohn's -- clinically in remission after a course of budesonide. Given that she remains symptom free, we are not pursuing additional therapy at this time. She is asked to notify me if she develops recurrent abdominal pain, loose stools or diarrhea. She voices understanding  2. Fatty liver disease -- improvement in liver enzymes as documented by CMP today. This is likely result of significant weight loss and exercise. We discussed the importance of continued diet and exercise which she seems very motivated to do. She is also working to control risk factors including her hypertension.  I will see her in  one year, sooner if necessary 25 minutes spent with the patient today. Greater than 50% was spent in counseling and coordination of care with the patient

## 2016-01-02 ENCOUNTER — Encounter: Payer: Self-pay | Admitting: Primary Care

## 2016-01-02 ENCOUNTER — Ambulatory Visit (INDEPENDENT_AMBULATORY_CARE_PROVIDER_SITE_OTHER): Payer: BLUE CROSS/BLUE SHIELD | Admitting: Primary Care

## 2016-01-02 VITALS — BP 128/86 | HR 87 | Temp 98.1°F | Ht 62.0 in | Wt 193.8 lb

## 2016-01-02 DIAGNOSIS — J029 Acute pharyngitis, unspecified: Secondary | ICD-10-CM | POA: Diagnosis not present

## 2016-01-02 DIAGNOSIS — J028 Acute pharyngitis due to other specified organisms: Principal | ICD-10-CM

## 2016-01-02 DIAGNOSIS — B9789 Other viral agents as the cause of diseases classified elsewhere: Secondary | ICD-10-CM

## 2016-01-02 NOTE — Progress Notes (Signed)
Pre visit review using our clinic review tool, if applicable. No additional management support is needed unless otherwise documented below in the visit note. 

## 2016-01-02 NOTE — Progress Notes (Signed)
Subjective:    Patient ID: Courtney Courtney Burns Courtney Burns, female    DOB: 1960-09-05, 55 y.o.   MRN: [id number]  HPI  Courtney Courtney Burns Courtney Burns 55 year old female who presents today with a chief complaint of sore throat. She also reports right ear pain, fatigue, and post nasal drip. Her sore throat has been present since yesterday. She was exposed to strep throat last week from a co-worker. She denies fevers, cough, shortness of breath. She's taken Alka-Selzer plus and throat lozenge with some improvement.   Review of Systems  Constitutional: Positive for chills and fatigue. Negative for fever.  HENT: Positive for ear pain, postnasal drip and sore throat. Negative for congestion and sinus pressure.   Respiratory: Negative for cough and shortness of breath.        Past Medical History:  Diagnosis Date  . Diabetes mellitus without complication ([place])   . GERD (gastroesophageal reflux disease)    Hx - diet controlled,  no meds  . History of chicken pox   . HSV infection   . Hypertension   . Hypothyroidism   . Ileitis   . Vitamin D deficiency      Social History   Social History  . Marital status: Married    Spouse name: N/A  . Number of children: 0  . Years of education: 34   Occupational History  . 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 Smoking status: Never Smoker  . Smokeless tobacco: Never Used  . Alcohol use No  . Drug use: No  . Sexual activity: Yes    Birth control/ protection: Post-menopausal   Other Topics Concern  . Not on file   Social History Narrative   Lives in [place].    Works for [employer].   Courtney Courtney Burns grew up in [place]. She lives in [place] with her husband Courtney Courtney Burns Courtney Burns) and their 3 cat (Courtney Burns, Courtney Courtney Burns Courtney Burns, Courtney Courtney Burns Courtney Burns).    She attended [employer] and obtained her Bachelors in [employer] with a minor in Youth worker.    She loves cooking and sewing.    Past Surgical History:  Procedure  Laterality Date  . APPENDECTOMY  1970  . BUNIONECTOMY Right 07/2001   right foot  . CHOLECYSTECTOMY  2005  . COLONOSCOPY  01/2004   Dr. Minette Headland, [place] - polyp  . ECTOPIC PREGNANCY SURGERY  1995  . RIGHT OOPHORECTOMY Right 03/2003   Abscessed cyst removed  . WISDOM TOOTH EXTRACTION      Family History  Problem Relation Age of Onset  . Hypertension Mother   . Diabetes Mother   . Cancer Mother 67    GBM  . Hyperlipidemia Father   . Heart disease Father     MI  . Hypertension Father   . Diabetes Father   . Depression Father   . COPD Father   . Diabetes Maternal Aunt     Uncontrolled with complications  . Kidney disease Maternal Aunt     Renal failure - dialysis  . Stroke Maternal Grandmother   . Liver cancer Maternal Grandfather   . Heart disease Paternal Grandmother     MI  . Diabetes Paternal Grandmother   . Prostate cancer Paternal Grandfather   . Cancer - Prostate Paternal Grandfather   . Cancer Maternal Uncle     brain tumor  . Colon cancer Neg Hx   . Esophageal cancer Neg Hx   . Rectal cancer  Neg Hx   . Stomach cancer Neg Hx     Allergies  Allergen Reactions  . Sulfa Antibiotics Rash    Current Outpatient Prescriptions on File Prior to Visit  Medication Sig Dispense Refill  . glucose blood test strip Use as instructed 100 each 12  . levothyroxine (SYNTHROID, LEVOTHROID) 75 MCG tablet TAKE 1 TABLET (75 MCG TOTAL) BY MOUTH EVERY MORNING. 90 tablet 3  . lisinopril (PRINIVIL,ZESTRIL) 5 MG tablet TAKE 1 TABLET BY MOUTH EVERY DAY 90 tablet 3  . metFORMIN (GLUCOPHAGE) 1000 MG tablet Take 0.5 tablets (500 mg total) by mouth 2 (two) times daily with a meal. 180 tablet 3  . valACYclovir (VALTREX) 500 MG tablet Take 1 tablet (500 mg total) by mouth 2 (two) times daily. 6 tablet 0   No current facility-administered medications on file prior to visit.     BP 128/86   Pulse 87   Temp 98.1 F (36.7 C) (Oral)   Ht 5' 2"  (1.575 m)   Wt 193 lb 12.8 oz (87.9 kg)    LMP 07/07/2013   SpO2 98%   BMI 35.45 kg/m    Objective:   Physical Exam  Constitutional: She appears well-nourished.  HENT:  Right Ear: Tympanic membrane and ear canal normal.  Left Ear: Tympanic membrane and ear canal normal.  Nose: Right sinus exhibits no maxillary sinus tenderness and no frontal sinus tenderness. Left sinus exhibits no maxillary sinus tenderness and no frontal sinus tenderness.  Mouth/Throat: Posterior oropharyngeal erythema present. No oropharyngeal exudate or posterior oropharyngeal edema.  Eyes: Conjunctivae are normal.  Neck: Neck supple.  Cardiovascular: Normal rate and regular rhythm.   Pulmonary/Chest: Effort normal and breath sounds normal. She has no wheezes. She has no rales.  Lymphadenopathy:    She has no cervical adenopathy.  Skin: Skin is warm and dry.          Assessment & Plan:  Sore Throat:  Present since yesterday. Exposed to strep throat last week through a co-worker. No fevers, cough, shortness of breath. Some improvement with OTC treatment. Exam today with mild erythema, no exudate or edema. Rapid Strep: Negative. Suspect viral involvement and will treat with supportive measures. Ibuprofen, warm salt gargles, throat lozenges.  Return precautions provided.  Sheral Flow, NP

## 2016-01-02 NOTE — Patient Instructions (Signed)
You are negative for strep throat which is reassuring.  Your symptoms are representative of a viral illness which will resolve on its own over time. Our goal is to treat your symptoms in order to aid your body in the healing process and to make you more comfortable.   I recommend ibuprofen 600 mg three times daily as needed for pain and inflammation.  You may continue the throat lozenges. Try warm salt gargles three times daily.  Please notify me if you develop persistent fevers of 101, start coughing up green mucous, notice increased fatigue or weakness, or feel worse after 1 week of onset of symptoms.   Increase consumption of water intake and rest.  It was a pleasure to see you today!

## 2016-04-30 ENCOUNTER — Other Ambulatory Visit: Payer: Self-pay | Admitting: Primary Care

## 2016-04-30 DIAGNOSIS — E559 Vitamin D deficiency, unspecified: Secondary | ICD-10-CM

## 2016-04-30 DIAGNOSIS — E785 Hyperlipidemia, unspecified: Secondary | ICD-10-CM

## 2016-04-30 DIAGNOSIS — E039 Hypothyroidism, unspecified: Secondary | ICD-10-CM

## 2016-04-30 DIAGNOSIS — E119 Type 2 diabetes mellitus without complications: Secondary | ICD-10-CM

## 2016-05-09 ENCOUNTER — Other Ambulatory Visit (INDEPENDENT_AMBULATORY_CARE_PROVIDER_SITE_OTHER): Payer: BLUE CROSS/BLUE SHIELD

## 2016-05-09 DIAGNOSIS — E785 Hyperlipidemia, unspecified: Secondary | ICD-10-CM

## 2016-05-09 DIAGNOSIS — E119 Type 2 diabetes mellitus without complications: Secondary | ICD-10-CM | POA: Diagnosis not present

## 2016-05-09 DIAGNOSIS — E039 Hypothyroidism, unspecified: Secondary | ICD-10-CM

## 2016-05-09 DIAGNOSIS — E559 Vitamin D deficiency, unspecified: Secondary | ICD-10-CM | POA: Diagnosis not present

## 2016-05-09 LAB — COMPREHENSIVE METABOLIC PANEL
ALT: 26 U/L (ref 0–35)
AST: 20 U/L (ref 0–37)
Albumin: 4.2 g/dL (ref 3.5–5.2)
Alkaline Phosphatase: 69 U/L (ref 39–117)
BUN: 19 mg/dL (ref 6–23)
CO2: 30 mEq/L (ref 19–32)
Calcium: 9.5 mg/dL (ref 8.4–10.5)
Chloride: 104 mEq/L (ref 96–112)
Creatinine, Ser: 0.89 mg/dL (ref 0.40–1.20)
GFR: 69.49 mL/min (ref >60.00)
Glucose, Bld: 146 mg/dL — ABNORMAL HIGH (ref 70–99)
Potassium: 4.7 mEq/L (ref 3.5–5.1)
Sodium: 138 mEq/L (ref 135–145)
Total Bilirubin: 0.4 mg/dL (ref 0.2–1.2)
Total Protein: 6.8 g/dL (ref 6.0–8.3)

## 2016-05-09 LAB — LIPID PANEL
Cholesterol: 178 mg/dL (ref 0–200)
HDL: 37.8 mg/dL — ABNORMAL LOW (ref >39.00)
LDL Cholesterol: 111 mg/dL — ABNORMAL HIGH (ref 0–99)
NonHDL: 140.13
Total CHOL/HDL Ratio: 5
Triglycerides: 147 mg/dL (ref 0.0–149.0)
VLDL: 29.4 mg/dL (ref 0.0–40.0)

## 2016-05-09 LAB — TSH: TSH: 2.07 u[IU]/mL (ref 0.35–4.50)

## 2016-05-09 LAB — VITAMIN D 25 HYDROXY (VIT D DEFICIENCY, FRACTURES): VITD: 12.16 ng/mL — ABNORMAL LOW (ref 30.00–100.00)

## 2016-05-09 LAB — HEMOGLOBIN A1C: Hgb A1c MFr Bld: 7.1 % — ABNORMAL HIGH (ref 4.6–6.5)

## 2016-05-15 ENCOUNTER — Ambulatory Visit (INDEPENDENT_AMBULATORY_CARE_PROVIDER_SITE_OTHER): Payer: BLUE CROSS/BLUE SHIELD | Admitting: Primary Care

## 2016-05-15 ENCOUNTER — Encounter: Payer: Self-pay | Admitting: Primary Care

## 2016-05-15 VITALS — BP 128/74 | HR 97 | Temp 98.1°F | Ht 62.0 in | Wt 195.8 lb

## 2016-05-15 DIAGNOSIS — R945 Abnormal results of liver function studies: Secondary | ICD-10-CM

## 2016-05-15 DIAGNOSIS — Z0000 Encounter for general adult medical examination without abnormal findings: Secondary | ICD-10-CM | POA: Diagnosis not present

## 2016-05-15 DIAGNOSIS — E559 Vitamin D deficiency, unspecified: Secondary | ICD-10-CM | POA: Diagnosis not present

## 2016-05-15 DIAGNOSIS — E039 Hypothyroidism, unspecified: Secondary | ICD-10-CM

## 2016-05-15 DIAGNOSIS — E785 Hyperlipidemia, unspecified: Secondary | ICD-10-CM | POA: Diagnosis not present

## 2016-05-15 DIAGNOSIS — Z1239 Encounter for other screening for malignant neoplasm of breast: Secondary | ICD-10-CM

## 2016-05-15 DIAGNOSIS — Z23 Encounter for immunization: Secondary | ICD-10-CM

## 2016-05-15 DIAGNOSIS — E119 Type 2 diabetes mellitus without complications: Secondary | ICD-10-CM

## 2016-05-15 DIAGNOSIS — K7581 Nonalcoholic steatohepatitis (NASH): Secondary | ICD-10-CM

## 2016-05-15 DIAGNOSIS — Z1231 Encounter for screening mammogram for malignant neoplasm of breast: Secondary | ICD-10-CM

## 2016-05-15 DIAGNOSIS — R7989 Other specified abnormal findings of blood chemistry: Secondary | ICD-10-CM

## 2016-05-15 MED ORDER — VITAMIN D (ERGOCALCIFEROL) 1.25 MG (50000 UNIT) PO CAPS: ORAL_CAPSULE | ORAL | 0 refills | Status: DC

## 2016-05-15 MED ORDER — GLUCOSE BLOOD VI STRP: ORAL_STRIP | 5 refills | Status: DC

## 2016-05-15 MED ORDER — ATORVASTATIN CALCIUM 20 MG PO TABS: 20.0000 mg | ORAL_TABLET | Freq: Every evening | ORAL | 3 refills | Status: DC

## 2016-05-15 NOTE — Progress Notes (Signed)
Pre visit review using our clinic review tool, if applicable. No additional management support is needed unless otherwise documented below in the visit note. 

## 2016-05-15 NOTE — Progress Notes (Signed)
Subjective:    Patient ID: Courtney Burns, female    DOB: [birth year], 56 y.o.   MRN: [id number]  HPI  [name] is a 56 year old female who presents today for complete physical.  Immunizations: -Tetanus: Unsure, believes it's been over 10 years. Due. -Influenza: Did not complete -Pneumonia: Due today.  Diet: She endorses a healthy diet. She is using My [employer]itness Pal. Breakfast: Boiled eggs, toast, fruit, baked oatmeal Lunch: Salad, tuna salad, protein Dinner: Meat, vegetables, brown rice occasionally Snacks: None Desserts: Occasionally Beverages: Water, diet mountain dew  Exercise: She is walking 2 miles, five days weekly, for the past 3 weeks.  Eye exam: Completed in 2017 Dental exam: Completes annually Colonoscopy: Completed in 2017, due again in 2027 Pap Smear: Completed in 2015, normal. Follows with GYN. Mammogram: Completed in 2015, due.  Hepatitis C: Completed in 2017, negative  Wt Readings from Last 3 Encounters:  05/15/16 195 lb 12.8 oz (88.8 kg)  01/02/16 193 lb 12.8 oz (87.9 kg)  12/13/15 192 lb 4 oz (87.2 kg)      Review of Systems  Constitutional: Negative for unexpected weight change.  HENT: Negative for rhinorrhea.   Respiratory: Negative for cough and shortness of breath.   Cardiovascular: Negative for chest pain.  Gastrointestinal: Negative for constipation and diarrhea.  Genitourinary: Negative for difficulty urinating and menstrual problem.  Musculoskeletal: Negative for arthralgias and myalgias.  Skin: Negative for rash.  Allergic/Immunologic: Negative for environmental allergies.  Neurological: Negative for dizziness, numbness and headaches.  Psychiatric/Behavioral:       Denies concerns for anxiety or depression       Past Medical History:  Diagnosis Date  . Diabetes mellitus without complication ([place])   . GERD (gastroesophageal reflux disease)    Hx - diet controlled,  no meds  . History of chicken pox   . HSV infection   .  Hypertension   . Hypothyroidism   . Ileitis   . Vitamin D deficiency      Social History   Social History  . Marital status: Married    Spouse name: N/A  . Number of children: 0  . Years of education: 70   Occupational History  . 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 Smoking status: Never Smoker  . Smokeless tobacco: Never Used  . Alcohol use No  . Drug use: No  . Sexual activity: Yes    Birth control/ protection: Post-menopausal   Other Topics Concern  . Not on file   Social History Narrative   Lives in [place].    Works for [employer].   Naureen grew up in [place]. She lives in [place] with her husband [name]) and their 3 cat ([name], [place], [place]).    She attended [employer] and obtained her Bachelors in [employer] with a minor in Youth worker.    She loves cooking and sewing.    Past Surgical History:  Procedure Laterality Date  . APPENDECTOMY  1970  . BUNIONECTOMY Right 07/2001   right foot  . CHOLECYSTECTOMY  2005  . COLONOSCOPY  01/2004   Dr. Minette Headland, [place] - polyp  . ECTOPIC PREGNANCY SURGERY  1995  . RIGHT OOPHORECTOMY Right 03/2003   Abscessed cyst removed  . WISDOM TOOTH EXTRACTION      Family History  Problem Relation Age of Onset  . Hypertension Mother   . Diabetes Mother   .  Cancer Mother 64    GBM  . Hyperlipidemia Father   . Heart disease Father     MI  . Hypertension Father   . Diabetes Father   . Depression Father   . COPD Father   . Diabetes Maternal Aunt     Uncontrolled with complications  . Kidney disease Maternal Aunt     Renal failure - dialysis  . Stroke Maternal Grandmother   . Liver cancer Maternal Grandfather   . Heart disease Paternal Grandmother     MI  . Diabetes Paternal Grandmother   . Prostate cancer Paternal Grandfather   . Cancer - Prostate Paternal Grandfather   . Cancer Maternal Uncle     brain tumor  .  Colon cancer Neg Hx   . Esophageal cancer Neg Hx   . Rectal cancer Neg Hx   . Stomach cancer Neg Hx     Allergies  Allergen Reactions  . Sulfa Antibiotics Rash    Current Outpatient Prescriptions on File Prior to Visit  Medication Sig Dispense Refill  . levothyroxine (SYNTHROID, LEVOTHROID) 75 MCG tablet TAKE 1 TABLET (75 MCG TOTAL) BY MOUTH EVERY MORNING. 90 tablet 3  . lisinopril (PRINIVIL,ZESTRIL) 5 MG tablet TAKE 1 TABLET BY MOUTH EVERY DAY 90 tablet 3  . metFORMIN (GLUCOPHAGE) 1000 MG tablet Take 0.5 tablets (500 mg total) by mouth 2 (two) times daily with a meal. 180 tablet 3  . valACYclovir (VALTREX) 500 MG tablet Take 1 tablet (500 mg total) by mouth 2 (two) times daily. 6 tablet 0   No current facility-administered medications on file prior to visit.     BP 128/74   Pulse 97   Temp 98.1 F (36.7 C) (Oral)   Ht 5' 2"  (1.575 m)   Wt 195 lb 12.8 oz (88.8 kg)   LMP 07/07/2013   SpO2 96%   BMI 35.81 kg/m    Objective:   Physical Exam  Constitutional: She is oriented to person, place, and time. She appears well-nourished.  HENT:  Right Ear: Tympanic membrane and ear canal normal.  Left Ear: Tympanic membrane and ear canal normal.  Nose: Nose normal.  Mouth/Throat: Oropharynx is clear and moist.  Eyes: Conjunctivae and EOM are normal. Pupils are equal, round, and reactive to light.  Neck: Neck supple. No thyromegaly present.  Cardiovascular: Normal rate and regular rhythm.   No murmur heard. Pulmonary/Chest: Effort normal and breath sounds normal. She has no rales.  Abdominal: Soft. Bowel sounds are normal. There is no tenderness.  Musculoskeletal: Normal range of motion.  Lymphadenopathy:    She has no cervical adenopathy.  Neurological: She is alert and oriented to person, place, and time. She has normal reflexes. No cranial nerve deficit.  Skin: Skin is warm and dry. No rash noted.  Psychiatric: She has a normal mood and affect.          Assessment &  Plan:

## 2016-05-15 NOTE — Assessment & Plan Note (Signed)
Recent level of 12. Will initiate vitamin D 50,000 units once weekly. Recheck in 3 months.

## 2016-05-15 NOTE — Assessment & Plan Note (Signed)
Td due, provided today. Pneumonia vaccination due, provided today. Mammogram due, ordered. Pap due, will see GYN later this year. Colonoscopy UTD. Discussed the importance of a healthy diet and regular exercise in order for weight loss, and to reduce the risk of other medical diseases. Exam unremarkable. Labs overall stable. Follow up in 1 year for annual exam.

## 2016-05-15 NOTE — Assessment & Plan Note (Signed)
Recent TSH stable, continue levothyroxine 75 mcg.

## 2016-05-15 NOTE — Patient Instructions (Signed)
Your cholesterol levels are slightly too high, continue to work on exercise and healthy diet.  Continue exercising. You should be getting 150 minutes of moderate intensity exercise weekly.  Start atorvastatin 20 mg tablets for high cholesterol. Take 1 tablet every evening at bedtime.  Start vitamin D capsules. Take 1 capsule by mouth once weekly for 12 weeks.  You were provided with a tetanus vaccination which will cover you for 10 years, and a pneumonia vaccination.  Follow up with GYN as discussed. Schedule your mammogram.  Schedule a lab only appointment in 3 months to re-check vitamin D, cholesterol, and liver function.  Follow up in 1 year for your annual exam or sooner if needed.  It was a pleasure to see you today!

## 2016-05-15 NOTE — Assessment & Plan Note (Addendum)
Recent A1C of 7.1, improved from prior reading. Discussed to continue to work on diet and exercise, no medication changes made today. Pneumonia vaccination provided today. Managed on ACE, initiated statin. Repeat in 6 months.

## 2016-05-15 NOTE — Assessment & Plan Note (Signed)
Stable on recent labs. Closely monitor with initiation of statin.

## 2016-05-15 NOTE — Assessment & Plan Note (Signed)
Recent LFT's unremarkable.

## 2016-05-15 NOTE — Assessment & Plan Note (Signed)
LDL above goal. ASCVD risk score of 5.3%, recommendations for moderate intensity statin. Rx for atorvastatin sent to pharmacy. Will closely monitor LFT's. Repeat lipids and LFT's in 3 months.

## 2016-05-21 ENCOUNTER — Other Ambulatory Visit: Payer: Self-pay | Admitting: Primary Care

## 2016-05-21 DIAGNOSIS — E119 Type 2 diabetes mellitus without complications: Secondary | ICD-10-CM

## 2016-05-21 NOTE — Telephone Encounter (Signed)
Ok to refill? Electronically refill request for lisinopril (PRINIVIL,ZESTRIL) 5 MG tablet. Medication have not been prescribed by Anda Kraft. Patient was Dr Thomes Dinning patient. Last seen on 05/15/2016.

## 2016-06-11 ENCOUNTER — Other Ambulatory Visit: Payer: Self-pay | Admitting: Internal Medicine

## 2016-06-11 ENCOUNTER — Other Ambulatory Visit: Payer: Self-pay | Admitting: Primary Care

## 2016-06-11 ENCOUNTER — Encounter: Payer: Self-pay | Admitting: Primary Care

## 2016-06-11 DIAGNOSIS — A6000 Herpesviral infection of urogenital system, unspecified: Secondary | ICD-10-CM

## 2016-06-11 MED ORDER — VALACYCLOVIR HCL 500 MG PO TABS: 500.0000 mg | ORAL_TABLET | Freq: Two times a day (BID) | ORAL | 3 refills | Status: DC

## 2016-06-24 ENCOUNTER — Other Ambulatory Visit: Payer: Self-pay | Admitting: Primary Care

## 2016-08-07 LAB — HM DIABETES EYE EXAM

## 2016-08-08 ENCOUNTER — Encounter: Payer: Self-pay | Admitting: Primary Care

## 2016-08-14 ENCOUNTER — Other Ambulatory Visit (INDEPENDENT_AMBULATORY_CARE_PROVIDER_SITE_OTHER): Payer: BLUE CROSS/BLUE SHIELD

## 2016-08-14 DIAGNOSIS — E559 Vitamin D deficiency, unspecified: Secondary | ICD-10-CM | POA: Diagnosis not present

## 2016-08-14 DIAGNOSIS — E785 Hyperlipidemia, unspecified: Secondary | ICD-10-CM | POA: Diagnosis not present

## 2016-08-14 LAB — LIPID PANEL
Cholesterol: 125 mg/dL (ref 0–200)
HDL: 35.7 mg/dL — ABNORMAL LOW (ref >39.00)
LDL Cholesterol: 59 mg/dL (ref 0–99)
NonHDL: 89.27
Total CHOL/HDL Ratio: 4
Triglycerides: 153 mg/dL — ABNORMAL HIGH (ref 0.0–149.0)
VLDL: 30.6 mg/dL (ref 0.0–40.0)

## 2016-08-14 LAB — HEPATIC FUNCTION PANEL
ALT: 20 U/L (ref 0–35)
AST: 16 U/L (ref 0–37)
Albumin: 3.9 g/dL (ref 3.5–5.2)
Alkaline Phosphatase: 76 U/L (ref 39–117)
Bilirubin, Direct: 0.1 mg/dL (ref 0.0–0.3)
Total Bilirubin: 0.3 mg/dL (ref 0.2–1.2)
Total Protein: 6.1 g/dL (ref 6.0–8.3)

## 2016-08-14 LAB — VITAMIN D 25 HYDROXY (VIT D DEFICIENCY, FRACTURES): VITD: 24.2 ng/mL — ABNORMAL LOW (ref 30.00–100.00)

## 2016-08-15 ENCOUNTER — Other Ambulatory Visit: Payer: Self-pay | Admitting: *Deleted

## 2016-08-15 DIAGNOSIS — E559 Vitamin D deficiency, unspecified: Secondary | ICD-10-CM

## 2016-08-15 MED ORDER — VITAMIN D (ERGOCALCIFEROL) 1.25 MG (50000 UNIT) PO CAPS: ORAL_CAPSULE | ORAL | 0 refills | Status: DC

## 2016-08-21 ENCOUNTER — Other Ambulatory Visit: Payer: Self-pay | Admitting: Primary Care

## 2016-10-24 ENCOUNTER — Encounter: Payer: Self-pay | Admitting: Obstetrics and Gynecology

## 2016-10-24 ENCOUNTER — Ambulatory Visit (INDEPENDENT_AMBULATORY_CARE_PROVIDER_SITE_OTHER): Payer: BLUE CROSS/BLUE SHIELD | Admitting: Obstetrics and Gynecology

## 2016-10-24 VITALS — BP 150/90 | HR 125 | Ht 64.0 in | Wt 201.0 lb

## 2016-10-24 DIAGNOSIS — R102 Pelvic and perineal pain: Secondary | ICD-10-CM | POA: Diagnosis not present

## 2016-10-24 DIAGNOSIS — K5090 Crohn's disease, unspecified, without complications: Secondary | ICD-10-CM | POA: Diagnosis not present

## 2016-10-24 LAB — POCT URINALYSIS DIPSTICK
Bilirubin, UA: NEGATIVE
Blood, UA: NEGATIVE
Glucose, UA: NEGATIVE
Ketones, UA: NEGATIVE
Leukocytes, UA: NEGATIVE
Nitrite, UA: NEGATIVE
Protein, UA: NEGATIVE
Spec Grav, UA: 1.015 (ref 1.010–1.025)
pH, UA: 5 (ref 5.0–8.0)

## 2016-10-24 NOTE — Progress Notes (Signed)
Chief Complaint  Patient presents with  . Pelvic Pain    pain for 2 weeks hurts behind bellybutton    HPI:      [name] is a 56 y.o. G2P0020 who LMP was Patient's last menstrual period was 07/07/2013., presents today for pelvic pain for the past 2 wks that radiates to belly button. Pain is crampy and constant, but severity waxes and wanes. It's a 3/4 out of 10 on pain scale. Pt denies any urin sx, vag sx, no PMB, no fevers. She was diagnosed with Crohns due to colonoscopy and lab findings last yr, but never had any intestinal pain/sx. No nausea, vomiting, constipation, but she has had diarrhea, which is normal for pt since lap chole. She has a hx of ovar cyst and ectopic preg in past. She has had 2 neg UPTs. No abd masses noticed. No dyspareunia.   She had a colonoscopy 2017.  She has pap/annual with Dr. Kenton Burns 11/04/16. She is s/p multiple abd surgeries.  No FH ovar/colon/abd cancers.   Past Medical History:  Diagnosis Date  . Diabetes mellitus without complication ([place])    type 2  . GERD (gastroesophageal reflux disease)    Hx - diet controlled,  no meds  . History of chicken pox   . History of Papanicolaou smear of cervix 08/2013   neg per pt  . HSV infection   . Hypertension   . Hyperthyroidism   . Hypothyroidism    had radiation then thyroid became low  . Ileitis   . Vitamin D deficiency     Past Surgical History:  Procedure Laterality Date  . APPENDECTOMY  1970  . BUNIONECTOMY Right 07/2001   right foot  . CHOLECYSTECTOMY  2005  . COLONOSCOPY  01/2004; 06/2015   Dr. Minette Headland, [place] - polyp; [place]  . [place]   left salpingectomy  . RIGHT OOPHORECTOMY Right 03/2003   Abscessed cyst removed  . WISDOM TOOTH EXTRACTION      Family History  Problem Relation Age of Onset  . Hypertension Mother   . Diabetes Mother   . Cancer Mother 83       GBM  . Hyperlipidemia Father   . Heart disease Father        MI  . Hypertension  Father   . Diabetes Father   . Depression Father   . COPD Father   . Diabetes Maternal Aunt        Uncontrolled with complications  . Kidney disease Maternal Aunt        Renal failure - dialysis  . Stroke Maternal Grandmother   . Liver cancer Maternal Grandfather   . Cancer Maternal Grandfather 54       lung  . Heart disease Paternal Grandmother        MI  . Diabetes Paternal Grandmother   . Prostate cancer Paternal Grandfather 1  . Cancer - Prostate Paternal Grandfather   . Cancer Maternal Uncle 42       brain tumor  . Colon cancer Neg Hx   . Esophageal cancer Neg Hx   . Rectal cancer Neg Hx   . Stomach cancer Neg Hx     Social History   Social History  . Marital status: Married    Spouse name: N/A  . Number of children: 0  . Years of education: 35   Occupational History  . 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 Smoking status: Never Smoker  . Smokeless tobacco: Never Used  . Alcohol use No  . Drug use: No  . Sexual activity: Yes    Birth control/ protection: Post-menopausal   Other Topics Concern  . Not on file   Social History Narrative   Lives in [place].    Works for [employer].   Courtney Burns grew up in [place]. She lives in [place] with her husband Courtney Burns) and their 3 cat (Courtney Burns, Courtney Burns, Courtney Burns).    She attended [employer] and obtained her Bachelors in [employer] with a minor in Youth worker.    She loves cooking and sewing.     Current Outpatient Prescriptions:  .  atorvastatin (LIPITOR) 20 MG tablet, Take 1 tablet (20 mg total) by mouth every evening., Disp: 90 tablet, Rfl: 3 .  glucose blood (BAYER CONTOUR NEXT TEST) test strip, Use as instructed to test blood sugar up to 3 times daily, Disp: 100 each, Rfl: 5 .  levothyroxine (SYNTHROID, LEVOTHROID) 75 MCG tablet, TAKE 1 TABLET (75 MCG TOTAL) BY MOUTH EVERY MORNING., Disp: 90 tablet, Rfl: 1 .  lisinopril  (PRINIVIL,ZESTRIL) 5 MG tablet, TAKE 1 TABLET BY MOUTH EVERY DAY, Disp: 90 tablet, Rfl: 3 .  metFORMIN (GLUCOPHAGE) 1000 MG tablet, TAKE 1 TABLET (1,000 MG TOTAL) BY MOUTH 2 (TWO) TIMES DAILY WITH A MEAL., Disp: 180 tablet, Rfl: 1 .  valACYclovir (VALTREX) 500 MG tablet, Take 1 tablet (500 mg total) by mouth 2 (two) times daily., Disp: 6 tablet, Rfl: 3 .  Vitamin D, Ergocalciferol, (DRISDOL) 50000 units CAPS capsule, Take 1 capsule weekly for a total of 12 weeks., Disp: 12 capsule, Rfl: 0   ROS:  Review of Systems  Constitutional: Negative for fever.  Gastrointestinal: Positive for abdominal pain and diarrhea. Negative for blood in stool, constipation, nausea and vomiting.  Genitourinary: Positive for pelvic pain. Negative for dyspareunia, dysuria, flank pain, frequency, hematuria, urgency, vaginal bleeding, vaginal discharge and vaginal pain.  Musculoskeletal: Negative for back pain.  Skin: Negative for rash.     OBJECTIVE:   Vitals:  BP (!) 150/90   Pulse (!) 125   Ht 5' 4"  (1.626 m)   Wt 201 lb (91.2 kg)   LMP 07/07/2013   BMI 34.50 kg/m   Physical Exam  Constitutional: She is oriented to person, place, and time and well-developed, well-nourished, and in no distress. Vital signs are normal.  Abdominal: Normal appearance. There is no tenderness. There is no rigidity and no guarding.  Genitourinary: Vagina normal, uterus normal, cervix normal, right adnexa normal, left adnexa normal and vulva normal. Uterus is not enlarged. Cervix exhibits no motion tenderness and no tenderness. Right adnexum displays no mass and no tenderness. Left adnexum displays no mass and no tenderness. Vulva exhibits no erythema, no exudate, no lesion, no rash and no tenderness. Vagina exhibits no lesion.  Neurological: She is oriented to person, place, and time.  Vitals reviewed.   Results: Results for orders placed or performed in visit on 10/24/16 (from the past 24 hour(s))  POCT Urinalysis Dipstick      Status: Normal   Collection Time: 10/24/16 11:49 AM  Result Value Ref Range   Color, UA yellow    Clarity, UA     Glucose, UA neg    Bilirubin, UA neg    Ketones, UA neg    Spec Grav, UA 1.015 1.010 - 1.025   Blood, UA neg    pH,  UA 5.0 5.0 - 8.0   Protein, UA neg    Urobilinogen, UA  0.2 or 1.0 E.U./dL   Nitrite, UA neg    Leukocytes, UA Negative Negative     Assessment/Plan: Pelvic pain - Neg exam. Check GYN u/s. Will call pt with resutls. If neg, most likely GI.  - Plan: US Transvaginal Non-OB, POCT Urinalysis Dipstick  Crohn's disease without complication, unspecified gastrointestinal tract location [facility])     Return in about 1 day (around 10/25/2016) for GYN u/s--[facility] to call pt.  Alicia B. Copland, PA-C 10/24/2016 11:50 AM

## 2016-10-25 ENCOUNTER — Ambulatory Visit (INDEPENDENT_AMBULATORY_CARE_PROVIDER_SITE_OTHER): Payer: BLUE CROSS/BLUE SHIELD

## 2016-10-25 ENCOUNTER — Telehealth: Payer: Self-pay | Admitting: Obstetrics and Gynecology

## 2016-10-25 DIAGNOSIS — R102 Pelvic and perineal pain: Secondary | ICD-10-CM

## 2016-10-25 NOTE — Telephone Encounter (Signed)
Pt aware of neg u/s results. Sx most likely GI. Reassurance. F/u prn.

## 2016-10-28 ENCOUNTER — Encounter: Payer: Self-pay | Admitting: Primary Care

## 2016-10-28 ENCOUNTER — Encounter: Payer: Self-pay | Admitting: Family Medicine

## 2016-10-28 ENCOUNTER — Ambulatory Visit (INDEPENDENT_AMBULATORY_CARE_PROVIDER_SITE_OTHER): Payer: BLUE CROSS/BLUE SHIELD | Admitting: Family Medicine

## 2016-10-28 ENCOUNTER — Telehealth: Payer: Self-pay | Admitting: Obstetrics & Gynecology

## 2016-10-28 VITALS — BP 136/78 | HR 103 | Temp 98.2°F | Wt 200.2 lb

## 2016-10-28 DIAGNOSIS — R1030 Lower abdominal pain, unspecified: Secondary | ICD-10-CM | POA: Diagnosis not present

## 2016-10-28 MED ORDER — AMOXICILLIN-POT CLAVULANATE 875-125 MG PO TABS: 1.0000 | ORAL_TABLET | Freq: Two times a day (BID) | ORAL | 0 refills | Status: AC

## 2016-10-28 NOTE — Assessment & Plan Note (Signed)
2 wk h/o lower abd discomfort associated with bowel changes. She does have h/o diverticulosis by last year's colonoscopy, as well as biopsy consistent with crohn's ileitis. Possible diverticulitis vs crohn's flare. Will check labs (CBC, CMP) and treat presumed diverticulitis with augmentin course. Discussed if no improvement with treatment will likely need to return to see GI, and if worsening let us know right away. Pt agrees with plan.

## 2016-10-28 NOTE — Patient Instructions (Addendum)
Possible diverticulitis - will treat with augmentin course.  Labs today.  Low fiber diet (even clear liquid for next 24-48 hrs) - chicken broth, jello, gatorade, ginger ale.  If not improving with treatment or any worsening, let us know.

## 2016-10-28 NOTE — Progress Notes (Signed)
BP 136/78   Pulse (!) 103   Temp 98.2 F (36.8 C) (Oral)   Wt 200 lb 4 oz (90.8 kg)   LMP 07/07/2013   SpO2 98%   BMI 34.37 kg/m    CC: abd pain Subjective:    Patient ID: Courtney Burns, female    DOB: 11-Aug-1960, 56 y.o.   MRN: [id number]  HPI: Courtney Burns is a 56 y.o. female presenting on 10/28/2016 for Abdominal Pain (referred after seeing gyn)   2 wk h/o lower abd pain initially thought menstrual cramps. This started after corn intake 2 wks ago. Saw GYN and note in system reviewed - normal pelvic exam and normal ultrasound. UA normal, Upreg neg. Not GYN related. Describes LLQ and suprapubic pain. Intermittent bowel changes (looser and more frequent). No fevers. Mild nausea now better. No vomiting. No blood in stool.   She has changed her diet this weekend - avoiding fiber, more potatoes, good water. This did help some.   She does see Dr Hilarie Fredrickson GI. Colonoscopy last year - ?crohn's disease and sigmoid diverticulosis.   Relevant past medical, surgical, family and social history reviewed and updated as indicated. Interim medical history since our last visit reviewed. Allergies and medications reviewed and updated. Outpatient Medications Prior to Visit  Medication Sig Dispense Refill  . atorvastatin (LIPITOR) 20 MG tablet Take 1 tablet (20 mg total) by mouth every evening. 90 tablet 3  . glucose blood (BAYER CONTOUR NEXT TEST) test strip Use as instructed to test blood sugar up to 3 times daily 100 each 5  . levothyroxine (SYNTHROID, LEVOTHROID) 75 MCG tablet TAKE 1 TABLET (75 MCG TOTAL) BY MOUTH EVERY MORNING. 90 tablet 1  . lisinopril (PRINIVIL,ZESTRIL) 5 MG tablet TAKE 1 TABLET BY MOUTH EVERY DAY 90 tablet 3  . metFORMIN (GLUCOPHAGE) 1000 MG tablet TAKE 1 TABLET (1,000 MG TOTAL) BY MOUTH 2 (TWO) TIMES DAILY WITH A MEAL. 180 tablet 1  . valACYclovir (VALTREX) 500 MG tablet Take 1 tablet (500 mg total) by mouth 2 (two) times daily. 6 tablet 3  . Vitamin D, Ergocalciferol,  (DRISDOL) 50000 units CAPS capsule Take 1 capsule weekly for a total of 12 weeks. 12 capsule 0   No facility-administered medications prior to visit.      Per HPI unless specifically indicated in ROS section below Review of Systems     Objective:    BP 136/78   Pulse (!) 103   Temp 98.2 F (36.8 C) (Oral)   Wt 200 lb 4 oz (90.8 kg)   LMP 07/07/2013   SpO2 98%   BMI 34.37 kg/m   Wt Readings from Last 3 Encounters:  10/28/16 200 lb 4 oz (90.8 kg)  10/24/16 201 lb (91.2 kg)  11/10/15 196 lb (88.9 kg)    Physical Exam  Constitutional: She appears well-developed and well-nourished. No distress.  HENT:  Head: Normocephalic and atraumatic.  Mouth/Throat: Oropharynx is clear and moist. No oropharyngeal exudate.  Eyes: Pupils are equal, round, and reactive to light. Conjunctivae and EOM are normal. No scleral icterus.  Neck: Normal range of motion. Neck supple. No thyromegaly present.  Cardiovascular: Normal rate, regular rhythm, normal heart sounds and intact distal pulses.   No murmur heard. Pulmonary/Chest: Effort normal and breath sounds normal. No respiratory distress. She has no wheezes. She has no rales.  Abdominal: Soft. Normal appearance and bowel sounds are normal. She exhibits no distension and no mass. There is no hepatosplenomegaly. There is tenderness (mild) in  the suprapubic area. There is no rebound, no guarding and no CVA tenderness.  Musculoskeletal: She exhibits no edema.  Lymphadenopathy:    She has no cervical adenopathy.  Skin: Skin is warm and dry. No rash noted.  Psychiatric: She has a normal mood and affect.  Nursing note and vitals reviewed.  Results for orders placed or performed in visit on 10/24/16  POCT Urinalysis Dipstick  Result Value Ref Range   Color, UA yellow    Clarity, UA     Glucose, UA neg    Bilirubin, UA neg    Ketones, UA neg    Spec Grav, UA 1.015 1.010 - 1.025   Blood, UA neg    pH, UA 5.0 5.0 - 8.0   Protein, UA neg     Urobilinogen, UA  0.2 or 1.0 E.U./dL   Nitrite, UA neg    Leukocytes, UA Negative Negative      Assessment & Plan:   Problem List Items Addressed This Visit    Lower abdominal pain - Primary    2 wk h/o lower abd discomfort associated with bowel changes. She does have h/o diverticulosis by last year's colonoscopy, as well as biopsy consistent with crohn's ileitis. Possible diverticulitis vs crohn's flare. Will check labs (CBC, CMP) and treat presumed diverticulitis with augmentin course. Discussed if no improvement with treatment will likely need to return to see GI, and if worsening let us know right away. Pt agrees with plan.       Relevant Orders   Comprehensive metabolic panel   CBC with Differential/Platelet       Follow up plan: Return if symptoms worsen or fail to improve.  Ria Bush, MD

## 2016-10-28 NOTE — Telephone Encounter (Signed)
Review of ULTRASOUND.    I have personally reviewed images and report of recent ultrasound done at [facility].    Plan of management to be discussed with patient.

## 2016-10-29 LAB — COMPREHENSIVE METABOLIC PANEL
ALT: 27 U/L (ref 0–35)
AST: 21 U/L (ref 0–37)
Albumin: 4 g/dL (ref 3.5–5.2)
Alkaline Phosphatase: 87 U/L (ref 39–117)
BUN: 11 mg/dL (ref 6–23)
CO2: 30 mEq/L (ref 19–32)
Calcium: 9.5 mg/dL (ref 8.4–10.5)
Chloride: 102 mEq/L (ref 96–112)
Creatinine, Ser: 0.92 mg/dL (ref 0.40–1.20)
GFR: 66.77 mL/min (ref >60.00)
Glucose, Bld: 211 mg/dL — ABNORMAL HIGH (ref 70–99)
Potassium: 4.1 mEq/L (ref 3.5–5.1)
Sodium: 139 mEq/L (ref 135–145)
Total Bilirubin: 0.4 mg/dL (ref 0.2–1.2)
Total Protein: 6.9 g/dL (ref 6.0–8.3)

## 2016-10-29 LAB — CBC WITH DIFFERENTIAL/PLATELET
Basophils Absolute: 0.1 10*3/uL (ref 0.0–0.1)
Basophils Relative: 1.3 % (ref 0.0–3.0)
Eosinophils Absolute: 0.2 10*3/uL (ref 0.0–0.7)
Eosinophils Relative: 1.7 % (ref 0.0–5.0)
HCT: 41.5 % (ref 36.0–46.0)
Hemoglobin: 13.8 g/dL (ref 12.0–15.0)
Lymphocytes Relative: 26.1 % (ref 12.0–46.0)
Lymphs Abs: 2.4 10*3/uL (ref 0.7–4.0)
MCHC: 33.3 g/dL (ref 30.0–36.0)
MCV: 90.7 fl (ref 78.0–100.0)
Monocytes Absolute: 0.6 10*3/uL (ref 0.1–1.0)
Monocytes Relative: 7.1 % (ref 3.0–12.0)
Neutro Abs: 5.8 10*3/uL (ref 1.4–7.7)
Neutrophils Relative %: 63.8 % (ref 43.0–77.0)
Platelets: 403 10*3/uL — ABNORMAL HIGH (ref 150.0–400.0)
RBC: 4.57 Mil/uL (ref 3.87–5.11)
RDW: 13.5 % (ref 11.5–15.5)
WBC: 9.1 10*3/uL (ref 4.0–10.5)

## 2016-11-04 ENCOUNTER — Ambulatory Visit (INDEPENDENT_AMBULATORY_CARE_PROVIDER_SITE_OTHER): Payer: BLUE CROSS/BLUE SHIELD | Admitting: Obstetrics & Gynecology

## 2016-11-04 ENCOUNTER — Other Ambulatory Visit: Payer: Self-pay | Admitting: Primary Care

## 2016-11-04 ENCOUNTER — Encounter: Payer: Self-pay | Admitting: Obstetrics & Gynecology

## 2016-11-04 VITALS — BP 152/80 | HR 92 | Ht 64.0 in | Wt 200.0 lb

## 2016-11-04 DIAGNOSIS — Z124 Encounter for screening for malignant neoplasm of cervix: Secondary | ICD-10-CM | POA: Diagnosis not present

## 2016-11-04 DIAGNOSIS — Z01419 Encounter for gynecological examination (general) (routine) without abnormal findings: Secondary | ICD-10-CM

## 2016-11-04 DIAGNOSIS — E559 Vitamin D deficiency, unspecified: Secondary | ICD-10-CM

## 2016-11-04 DIAGNOSIS — Z0000 Encounter for general adult medical examination without abnormal findings: Secondary | ICD-10-CM

## 2016-11-04 NOTE — Progress Notes (Signed)
HPI:      [name] is a 56 y.o. G2P0020 who LMP was in the past, she presents today for her annual examination.  The patient has no complaints today. The patient is sexually active. Herlast pap: approximate date 2015 and was normal.  The patient  does perform self breast exams.  There is no notable family history of breast or ovarian cancer in her family. The patient is not taking hormone replacement therapy. Patient denies post-menopausal vaginal bleeding. (isolated episode last year of VB).   The patient has regular exercise: yes. The patient denies current symptoms of depression.    GYN Hx: Last Colonoscopy:1 year ago. Normal.  Last DEXA: never ago.    PMHx: Past Medical History:  Diagnosis Date  . Diabetes mellitus without complication ([place])    type 2  . GERD (gastroesophageal reflux disease)    Hx - diet controlled,  no meds  . History of chicken pox   . History of Papanicolaou smear of cervix 08/2013   neg per pt  . HSV infection   . Hypertension   . Hyperthyroidism   . Hypothyroidism    had radiation then thyroid became low  . Ileitis   . Vitamin D deficiency    Past Surgical History:  Procedure Laterality Date  . APPENDECTOMY  1970  . BUNIONECTOMY Right 07/2001   right foot  . CHOLECYSTECTOMY  2005  . COLONOSCOPY  01/2004; 06/2015   Dr. Minette Headland, [place] - polyp; nl  . [place]   left salpingectomy  . RIGHT OOPHORECTOMY Right 03/2003   Abscessed cyst removed  . WISDOM TOOTH EXTRACTION     Family History  Problem Relation Age of Onset  . Hypertension Mother   . Diabetes Mother   . Cancer Mother 39       GBM  . Hyperlipidemia Father   . Heart disease Father        MI  . Hypertension Father   . Diabetes Father   . Depression Father   . COPD Father   . Diabetes Maternal Aunt        Uncontrolled with complications  . Kidney disease Maternal Aunt        Renal failure - dialysis  . Stroke Maternal Grandmother   . Liver  cancer Maternal Grandfather   . Cancer Maternal Grandfather 54       lung  . Heart disease Paternal Grandmother        MI  . Diabetes Paternal Grandmother   . Prostate cancer Paternal Grandfather 27  . Cancer - Prostate Paternal Grandfather   . Cancer Maternal Uncle 42       brain tumor  . Colon cancer Neg Hx   . Esophageal cancer Neg Hx   . Rectal cancer Neg Hx   . Stomach cancer Neg Hx    Social History  Substance Use Topics  . Smoking status: Never Smoker  . Smokeless tobacco: Never Used  . Alcohol use No    Current Outpatient Prescriptions:  .  amoxicillin-clavulanate (AUGMENTIN) 875-125 MG tablet, Take 1 tablet by mouth 2 (two) times daily., Disp: 20 tablet, Rfl: 0 .  atorvastatin (LIPITOR) 20 MG tablet, Take 1 tablet (20 mg total) by mouth every evening., Disp: 90 tablet, Rfl: 3 .  glucose blood (BAYER CONTOUR NEXT TEST) test strip, Use as instructed to test blood sugar up to 3 times daily, Disp: 100 each, Rfl: 5 .  levothyroxine (SYNTHROID, LEVOTHROID)  75 MCG tablet, TAKE 1 TABLET (75 MCG TOTAL) BY MOUTH EVERY MORNING., Disp: 90 tablet, Rfl: 1 .  lisinopril (PRINIVIL,ZESTRIL) 5 MG tablet, TAKE 1 TABLET BY MOUTH EVERY DAY, Disp: 90 tablet, Rfl: 3 .  metFORMIN (GLUCOPHAGE) 1000 MG tablet, TAKE 1 TABLET (1,000 MG TOTAL) BY MOUTH 2 (TWO) TIMES DAILY WITH A MEAL., Disp: 180 tablet, Rfl: 1 .  valACYclovir (VALTREX) 500 MG tablet, Take 1 tablet (500 mg total) by mouth 2 (two) times daily., Disp: 6 tablet, Rfl: 3 .  Vitamin D, Ergocalciferol, (DRISDOL) 50000 units CAPS capsule, Take 1 capsule weekly for a total of 12 weeks., Disp: 12 capsule, Rfl: 0 Allergies: Sulfa antibiotics  Review of Systems  Constitutional: Negative for chills, fever and malaise/fatigue.  HENT: Negative for congestion, sinus pain and sore throat.   Eyes: Negative for blurred vision and pain.  Respiratory: Negative for cough and wheezing.   Cardiovascular: Negative for chest pain and leg swelling.    Gastrointestinal: Negative for abdominal pain, constipation, diarrhea, heartburn, nausea and vomiting.  Genitourinary: Negative for dysuria, frequency, hematuria and urgency.  Musculoskeletal: Negative for back pain, joint pain, myalgias and neck pain.  Skin: Negative for itching and rash.  Neurological: Negative for dizziness, tremors and weakness.  Endo/Heme/Allergies: Does not bruise/bleed easily.  Psychiatric/Behavioral: Negative for depression. The patient is not nervous/anxious and does not have insomnia.     Objective: BP (!) 152/80   Pulse 92   Ht 5' 4"  (1.626 m)   Wt 200 lb (90.7 kg)   LMP 07/07/2013   BMI 34.33 kg/m   Filed Weights   11/04/16 1429  Weight: 200 lb (90.7 kg)   Body mass index is 34.33 kg/m. Physical Exam  Constitutional: She is oriented to person, place, and time. She appears well-developed and well-nourished. No distress.  Genitourinary: Rectum normal, vagina normal and uterus normal. Pelvic exam was performed with patient supine. There is no rash or lesion on the right labia. There is no rash or lesion on the left labia. Vagina exhibits no lesion. No bleeding in the vagina. Right adnexum does not display mass and does not display tenderness. Left adnexum does not display mass and does not display tenderness. Cervix does not exhibit motion tenderness, lesion, friability or polyp.   Uterus is mobile and midaxial. Uterus is not enlarged or exhibiting a mass.  HENT:  Head: Normocephalic and atraumatic. Head is without laceration.  Right Ear: Hearing normal.  Left Ear: Hearing normal.  Nose: No epistaxis.  No foreign bodies.  Mouth/Throat: Uvula is midline, oropharynx is clear and moist and mucous membranes are normal.  Eyes: Pupils are equal, round, and reactive to light.  Neck: Normal range of motion. Neck supple. No thyromegaly present.  Cardiovascular: Normal rate and regular rhythm.  Exam reveals no gallop and no friction rub.   No murmur  heard. Pulmonary/Chest: Effort normal and breath sounds normal. No respiratory distress. She has no wheezes. Right breast exhibits no mass, no skin change and no tenderness. Left breast exhibits no mass, no skin change and no tenderness.  Abdominal: Soft. Bowel sounds are normal. She exhibits no distension. There is no tenderness. There is no rebound.  Musculoskeletal: Normal range of motion.  Neurological: She is alert and oriented to person, place, and time. No cranial nerve deficit.  Skin: Skin is warm and dry.  Psychiatric: She has a normal mood and affect. Judgment normal.  Vitals reviewed.   Assessment: Annual Exam 1. Annual physical exam   2.  Screening for cervical cancer     Plan:            1.  Cervical Screening-  Pap smear done today  2. Breast screening- Exam annually and mammogram scheduled  3. Colonoscopy every 10 years, Hemoccult testing after age 79; deferred today as is having extensive GI WU this week for pain and possible Crohns  4. Labs managed by PCP  5. Counseling for hormonal therapy: none  6. To see GI for div/Crohns concerns     F/U  Return in about 1 year (around 11/04/2017) for Annual.  Barnett Applebaum, MD, [facility], [facility] 11/04/2016  2:59 PM

## 2016-11-04 NOTE — Patient Instructions (Signed)
PAP every three years Mammogram every year    Call [phone] to schedule at [facility] Colonoscopy every 5- 10 years Labs yearly (with PCP)

## 2016-11-05 ENCOUNTER — Encounter: Payer: Self-pay | Admitting: Internal Medicine

## 2016-11-05 ENCOUNTER — Ambulatory Visit
Admission: RE | Admit: 2016-11-05 | Discharge: 2016-11-05 | Disposition: A | Discharge status: Home / Self Care | Payer: BLUE CROSS/BLUE SHIELD | Source: Ambulatory Visit | Attending: Primary Care | Admitting: Primary Care

## 2016-11-05 DIAGNOSIS — Z1239 Encounter for other screening for malignant neoplasm of breast: Secondary | ICD-10-CM

## 2016-11-05 DIAGNOSIS — Z1231 Encounter for screening mammogram for malignant neoplasm of breast: Secondary | ICD-10-CM | POA: Diagnosis present

## 2016-11-06 ENCOUNTER — Other Ambulatory Visit: Payer: Self-pay | Admitting: *Deleted

## 2016-11-06 ENCOUNTER — Inpatient Hospital Stay
Admission: RE | Admit: 2016-11-06 | Discharge: 2016-11-06 | Disposition: A | Discharge status: Home / Self Care | Payer: Self-pay | Source: Ambulatory Visit | Attending: *Deleted | Admitting: *Deleted

## 2016-11-06 DIAGNOSIS — Z9289 Personal history of other medical treatment: Secondary | ICD-10-CM

## 2016-11-06 LAB — IGP, APTIMA HPV
HPV Aptima: NEGATIVE
PAP Smear Comment: 0

## 2016-11-19 ENCOUNTER — Other Ambulatory Visit: Payer: BLUE CROSS/BLUE SHIELD

## 2016-11-19 ENCOUNTER — Encounter: Payer: Self-pay | Admitting: Nurse Practitioner

## 2016-11-19 ENCOUNTER — Ambulatory Visit (INDEPENDENT_AMBULATORY_CARE_PROVIDER_SITE_OTHER): Payer: BLUE CROSS/BLUE SHIELD | Admitting: Nurse Practitioner

## 2016-11-19 VITALS — BP 160/70 | HR 84 | Ht 64.0 in | Wt 200.0 lb

## 2016-11-19 DIAGNOSIS — R197 Diarrhea, unspecified: Secondary | ICD-10-CM

## 2016-11-19 NOTE — Progress Notes (Deleted)
     HPI: Patient is a history of ileitis and fatty liver who is here for follow-up. She also has a history of diabetes, obesity, hypertension, hypothyroidism and vitamin D deficiency.      Past Medical History:  Diagnosis Date  . Diabetes mellitus without complication ([place])    type 2  . 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 GERD (gastroesophageal reflux disease)    Hx - diet controlled,  no meds  . History of chicken pox   . History of Papanicolaou smear of cervix 08/2013   neg per pt  . HSV infection   . Hypertension   . Hyperthyroidism   . Hypothyroidism    had radiation then thyroid became low  . Ileitis   . Macular degeneration of left eye   . Vitamin D deficiency     Patient's surgical history, family medical history, social history, medications and allergies were all reviewed in Epic    Physical Exam: BP (!) 160/70   Pulse 84   Ht 5' 4"  (1.626 m)   Wt 200 lb (90.7 kg)   LMP 07/07/2013   BMI 34.33 kg/m   GENERAL: *** in NAD PSYCH: :Pleasant, cooperative, normal affect EENT:  conjunctiva pink, mucous membranes moist, neck supple without masses CARDIAC:  RRR, ***murmur heard, ***peripheral edema PULM: Normal respiratory effort, lungs CTA bilaterally, no wheezing ABDOMEN:  soft, nontender, nondistended, no obvious masses, no hepatomegaly,  normal bowel sounds SKIN:  turgor, no lesions seen Musculoskeletal:  ***muscle tone, *** strength NEURO: Alert and oriented x 3, no focal neurologic deficits    ASSESSMENT and PLAN:  1. Pleasant *** year old   2.   [place] , NP 11/19/2016, 8:45 AM

## 2016-11-19 NOTE — Patient Instructions (Signed)
If you are age 57 or older, your body mass index should be between 23-30. Your Body mass index is 34.33 kg/m. If this is out of the aforementioned range listed, please consider follow up with your Primary Care Provider.  If you are age 67 or younger, your body mass index should be between 19-25. Your Body mass index is 34.33 kg/m. If this is out of the aformentioned range listed, please consider follow up with your Primary Care Provider.   Your physician has requested that you go to the basement for the following lab work before leaving today: C Diff  Use Imodium one to two times daily as needed.  Please call your insurance company about whether Promethium IBD test is covered.  Will call with lab results.  Thank you for choosing me and [facility].   Tye Savoy, NP

## 2016-11-19 NOTE — Progress Notes (Addendum)
HPI: Patient is a 56 year old female known to Dr. Hilarie Fredrickson. She has had loose stool since her cholecystectomy.  We have been evaluating her for possible Crohn's disease. Patient was incidentally found to have ileitis on CT scan which was done for elevated LFTs and RUQ pain. Subsequent colonoscopy with terminal ileal evaluation revealed  revealed a terminal ileal stricture with polypoid changes at the narrowing. Biopsies c/w focal active ileitis but not necessarily Crohn's disease. Dr. Hilarie Fredrickson still favored Crohn's diagnosis, patient was treated with Entocort, her symptoms did resolve. IBD panel did not suggest IBD. A Prometheus IBD diagnostic test was recommended and patient was to call her insurance company to see if they would pay for the study. She cannot remember what transpired after that but doesn't recall contacting the insurance company. She did get better and did fine for months.   In August Ms. Staton developed periumbilical pain / diffuse lower abdominal pain greatest in the LLQ. Pain was reminiscent of when she had a cyst so patient saw GYN, exam and ultrasound negative per patient.  She subsequently saw her PCP and was treated with Augmentin for possible diverticulitis and the pain has resolved but she is still more than usual amount of loose stools. Stool is non-bloody. No nocturnal stooling. No fevers. She does have joint aches in her left wrist and right ankle.   Past Medical History:  Diagnosis Date  . Diabetes mellitus without complication ([place])    type 2  . 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 GERD (gastroesophageal reflux disease)    Hx - diet controlled,  no meds  . History of chicken pox   . History of Papanicolaou smear of cervix 08/2013   neg per pt  . HSV infection   . Hypertension   . Hyperthyroidism   . Hypothyroidism    had radiation then thyroid became low  . Ileitis   . Macular degeneration of left eye   . Vitamin D deficiency      Patient's surgical history, family medical history, social history, medications and allergies were all reviewed in Epic    Physical Exam: BP (!) 160/70   Pulse 84   Ht 5' 4"  (1.626 m)   Wt 200 lb (90.7 kg)   LMP 07/07/2013   BMI 34.33 kg/m   GENERAL: well developed white female in NAD PSYCH: :Pleasant, cooperative, normal affect EENT:  conjunctiva pink, mucous membranes moist, neck supple without masses CARDIAC:  RRR, no murmur heard, no peripheral edema PULM: Normal respiratory effort, lungs CTA bilaterally, no wheezing ABDOMEN:  soft, nontender, nondistended, no obvious masses, no hepatomegaly,  normal bowel sounds SKIN:  turgor, no lesions seen Musculoskeletal:  Normal muscle tone, normal strength NEURO: Alert and oriented x 3, no focal neurologic deficits   ASSESSMENT and PLAN:   Loose stool / ? Crohn's ileitis.  Pleasant 56 year old female with chronic loose stool attributed to cholecystectomy but with endoscopic evidence for Crohn's ileitis in form of TI stricturing with surrounding mucosal changes on colonoscopy April 2017.  Biopsies c/w with focal active colitis. No granulomas, ? Allergic reaction. IBD serologies negative. She responded to initial course of Entocort.  Now with increased frequency of loose stool after course of antibiotics for presumed diverticulitis.  -stool for c-diff -imodium 1-2 times a day as needed once stool study submitted -If c-diff negative then need to continue workup to rule out Crohn's Patient will call insurance about whether Promethius sgi is covered. This  study uses serologic, genetic, and inflammation markers to diagnose IBD   Tye Savoy , NP 11/19/2016, 8:49 AM  Addendum: Reviewed and agree with initial management.  At time of my addendum stool study still pending Does need office follow-up Pyrtle, Lajuan Lines, MD

## 2016-12-12 ENCOUNTER — Telehealth: Payer: Self-pay

## 2016-12-12 NOTE — Telephone Encounter (Signed)
-----   Message from Willia Craze, NP sent at 12/05/2016  4:48 PM EDT ----- Courtney Burns, will you check on patient. She was going to submit stool for c-diff. Also please make sure she has a follow up with me or pyrtle. Thanks

## 2016-12-12 NOTE — Telephone Encounter (Signed)
No stoll has been submitted. No phone call received from this patient. Left a message for her to call and let us know how she is doing.

## 2016-12-15 ENCOUNTER — Other Ambulatory Visit: Payer: Self-pay | Admitting: Primary Care

## 2016-12-26 ENCOUNTER — Encounter: Payer: Self-pay | Admitting: Primary Care

## 2016-12-27 ENCOUNTER — Telehealth: Payer: Self-pay | Admitting: Primary Care

## 2016-12-27 NOTE — Telephone Encounter (Signed)
I spoke with pt and she is going to call [facility] after 1 pm to schedule Sat Clinic appt; pt asked to be on cancellation list at [employer] if cancellation for today; infor given to [place].

## 2016-12-27 NOTE — Telephone Encounter (Signed)
Noted  

## 2016-12-27 NOTE — Telephone Encounter (Signed)
Noted. Please get her in Monday with me if she can't be seen at Saturday clinic or with anyone today.

## 2016-12-27 NOTE — Telephone Encounter (Signed)
Patient Name: Courtney Burns  DOB: 03-Jun-1960    Initial Comment Caller states that she has been feeling "off" with a headache. Her blood pressure is 157/104 this morning and she has the headache again.   Nurse Assessment  Nurse: Cherie Dark RN, Jarrett Soho Date/Time (Eastern Time): 12/27/2016 9:09:56 AM  Confirm and document reason for call. If symptomatic, describe symptoms. ---Caller states she has been feeling off for about 2 weeks. She has had a headache since Monday. She took her BP on Wednesday and was told to follow up with her PCP. Her BP last night was 152/94. She has a bad headache this morning and her BP this am is 157/104.  Does the patient have any new or worsening symptoms? ---Yes  Will a triage be completed? ---Yes  Related visit to physician within the last 2 weeks? ---Yes  Does the PT have any chronic conditions? (i.e. diabetes, asthma, etc.) ---Yes  List chronic conditions. ---diabetes (takes Metformin)  Is this a behavioral health or substance abuse call? ---No     Guidelines    Guideline Title Affirmed Question Affirmed Notes  Headache [1] New headache AND [2] age > 50   High Blood Pressure Systolic BP >= 130 OR Diastolic >= 865    Final Disposition User   See PCP When Office is Open (within 3 days) [place], RN, Jarrett Soho    Comments  macular degeneration as well.  is on Lisinopril 48m and is taking Atorvastatin,  attempted to contact the office for an appt for the patient but received a message stating "we are currently experiencing system problems and are unable to process your call. Please try again later." Encouraged patient to call for an appt for the Saturday clinic and to call back PRN.   Referrals  REFERRED TO PCP OFFICE  [facility]  [facility]   Caller Disagree/Comply Comply  Caller Understands Yes  PreDisposition Call Doctor

## 2016-12-27 NOTE — Telephone Encounter (Signed)
Patient scheduled appointment for Saturday clinic.

## 2016-12-28 ENCOUNTER — Ambulatory Visit (INDEPENDENT_AMBULATORY_CARE_PROVIDER_SITE_OTHER): Payer: BLUE CROSS/BLUE SHIELD | Admitting: Family Medicine

## 2016-12-28 ENCOUNTER — Encounter: Payer: Self-pay | Admitting: Family Medicine

## 2016-12-28 VITALS — BP 168/104 | HR 100 | Temp 99.0°F | Ht 64.0 in | Wt 201.0 lb

## 2016-12-28 DIAGNOSIS — I10 Essential (primary) hypertension: Secondary | ICD-10-CM | POA: Diagnosis not present

## 2016-12-28 MED ORDER — LISINOPRIL 20 MG PO TABS: 20.0000 mg | ORAL_TABLET | Freq: Every day | ORAL | 3 refills | Status: DC

## 2016-12-28 NOTE — Assessment & Plan Note (Signed)
Elevated BP readings over the last 2 weeks without cause found. I did recommend she titrate lisinopril to 38m x2d then 244mdaily. RTC 10 days BMP check and f/u with PCP. Discussed dietary changes to improve hypertension control as well as provided with DASH diet handout. Pt agrees with plan.

## 2016-12-28 NOTE — Patient Instructions (Addendum)
Blood pressures are staying elevated - let's increase lisinopril to 83m daily over weekend then start 260mdaily on Monday. New dose at pharmacy. Return 1 wk from Monday for labs and f/u with Courtney KraftYour goal blood pressure is <140/90. Work on low salt/sodium diet - goal <1.5gm (1,50056mper day. Eat a diet high in fruits/vegetables and whole grains. Look into DASH diet. Goal activity is 150m30mk of moderate intensity exercise.  This can be split into 30 minute chunks.  If you are not at this level, you can start with smaller 10-15 min increments and slowly build up activity. Look at www.[place] for more resources.   DASH Eating Plan DASH stands for "Dietary Approaches to Stop Hypertension." The DASH eating plan is a healthy eating plan that has been shown to reduce high blood pressure (hypertension). It may also reduce your risk for type 2 diabetes, heart disease, and stroke. The DASH eating plan may also help with weight loss. What are tips for following this plan? General guidelines  Avoid eating more than 2,300 mg (milligrams) of salt (sodium) a day. If you have hypertension, you may need to reduce your sodium intake to 1,500 mg a day.  Limit alcohol intake to no more than 1 drink a day for nonpregnant women and 2 drinks a day for men. One drink equals 12 oz of beer, 5 oz of wine, or 1 oz of hard liquor.  Work with your health care provider to maintain a healthy body weight or to lose weight. Ask what an ideal weight is for you.  Get at least 30 minutes of exercise that causes your heart to beat faster (aerobic exercise) most days of the week. Activities may include walking, swimming, or biking.  Work with your health care provider or diet and nutrition specialist (dietitian) to adjust your eating plan to your individual calorie needs. Reading food labels  Check food labels for the amount of sodium per serving. Choose foods with less than 5 percent of the Daily Value of sodium.  Generally, foods with less than 300 mg of sodium per serving fit into this eating plan.  To find whole grains, look for the word "whole" as the first word in the ingredient list. Shopping  Buy products labeled as "low-sodium" or "no salt added."  Buy fresh foods. Avoid canned foods and premade or frozen meals. Cooking  Avoid adding salt when cooking. Use salt-free seasonings or herbs instead of table salt or sea salt. Check with your health care provider or pharmacist before using salt substitutes.  Do not fry foods. Cook foods using healthy methods such as baking, boiling, grilling, and broiling instead.  Cook with heart-healthy oils, such as olive, canola, soybean, or sunflower oil. Meal planning   Eat a balanced diet that includes: ? 5 or more servings of fruits and vegetables each day. At each meal, try to fill half of your plate with fruits and vegetables. ? Up to 6-8 servings of whole grains each day. ? Less than 6 oz of lean meat, poultry, or fish each day. A 3-oz serving of meat is about the same size as a deck of cards. One egg equals 1 oz. ? 2 servings of low-fat dairy each day. ? A serving of nuts, seeds, or beans 5 times each week. ? Heart-healthy fats. Healthy fats called Omega-3 fatty acids are found in foods such as flaxseeds and coldwater fish, like sardines, salmon, and mackerel.  Limit how much you eat of the following: ? Canned  or prepackaged foods. ? Food that is high in trans fat, such as fried foods. ? Food that is high in saturated fat, such as fatty meat. ? Sweets, desserts, sugary drinks, and other foods with added sugar. ? Full-fat dairy products.  Do not salt foods before eating.  Try to eat at least 2 vegetarian meals each week.  Eat more home-cooked food and less restaurant, buffet, and fast food.  When eating at a restaurant, ask that your food be prepared with less salt or no salt, if possible. What foods are recommended? The items listed may  not be a complete list. Talk with your dietitian about what dietary choices are best for you. Grains Whole-grain or whole-wheat bread. Whole-grain or whole-wheat pasta. Brown rice. [place]. Bulgur. Whole-grain and low-sodium cereals. Pita bread. Low-fat, low-sodium crackers. Whole-wheat flour tortillas. Vegetables Fresh or frozen vegetables (raw, steamed, roasted, or grilled). Low-sodium or reduced-sodium tomato and vegetable juice. Low-sodium or reduced-sodium tomato sauce and tomato paste. Low-sodium or reduced-sodium canned vegetables. Fruits All fresh, dried, or frozen fruit. Canned fruit in natural juice (without added sugar). Meat and other protein foods Skinless chicken or [place]. Ground chicken or [place]. Pork with fat trimmed off. Fish and seafood. Egg whites. Dried beans, peas, or lentils. Unsalted nuts, nut butters, and seeds. Unsalted canned beans. Lean cuts of beef with fat trimmed off. Low-sodium, lean deli meat. Dairy Low-fat (1%) or fat-free (skim) milk. Fat-free, low-fat, or reduced-fat cheeses. Nonfat, low-sodium ricotta or cottage cheese. Low-fat or nonfat yogurt. Low-fat, low-sodium cheese. Fats and oils Soft margarine without trans fats. Vegetable oil. Low-fat, reduced-fat, or light mayonnaise and salad dressings (reduced-sodium). Canola, safflower, olive, soybean, and sunflower oils. Avocado. Seasoning and other foods Herbs. Spices. Seasoning mixes without salt. Unsalted popcorn and pretzels. Fat-free sweets. What foods are not recommended? The items listed may not be a complete list. Talk with your dietitian about what dietary choices are best for you. Grains Baked goods made with fat, such as croissants, muffins, or some breads. Dry pasta or rice meal packs. Vegetables Creamed or fried vegetables. Vegetables in a cheese sauce. Regular canned vegetables (not low-sodium or reduced-sodium). Regular canned tomato sauce and paste (not low-sodium or reduced-sodium).  Regular tomato and vegetable juice (not low-sodium or reduced-sodium). [name]. Olives. Fruits Canned fruit in a light or heavy syrup. Fried fruit. Fruit in cream or butter sauce. Meat and other protein foods Fatty cuts of meat. Ribs. Fried meat. Berniece Salines. Sausage. Bologna and other processed lunch meats. Salami. Fatback. Hotdogs. Bratwurst. Salted nuts and seeds. Canned beans with added salt. Canned or smoked fish. Whole eggs or egg yolks. Chicken or [place] with skin. Dairy Whole or 2% milk, cream, and half-and-half. Whole or full-fat cream cheese. Whole-fat or sweetened yogurt. Full-fat cheese. Nondairy creamers. Whipped toppings. Processed cheese and cheese spreads. Fats and oils Butter. Stick margarine. Lard. Shortening. Ghee. Bacon fat. Tropical oils, such as coconut, palm kernel, or palm oil. Seasoning and other foods Salted popcorn and pretzels. Onion salt, garlic salt, seasoned salt, table salt, and sea salt. Worcestershire sauce. Tartar sauce. Barbecue sauce. Teriyaki sauce. Soy sauce, including reduced-sodium. Steak sauce. Canned and packaged gravies. Fish sauce. Oyster sauce. Cocktail sauce. Horseradish that you find on the shelf. Ketchup. Mustard. Meat flavorings and tenderizers. Bouillon cubes. Hot sauce and Tabasco sauce. Premade or packaged marinades. Premade or packaged taco seasonings. Relishes. Regular salad dressings. Where to find more information:  National Heart, Lung, and [place]: https://wilson-eaton.com/  American Heart Association: www.heart.org Summary  The  DASH eating plan is a healthy eating plan that has been shown to reduce high blood pressure (hypertension). It may also reduce your risk for type 2 diabetes, heart disease, and stroke.  With the DASH eating plan, you should limit salt (sodium) intake to 2,300 mg a day. If you have hypertension, you may need to reduce your sodium intake to 1,500 mg a day.  When on the DASH eating plan, aim to eat more fresh fruits and  vegetables, whole grains, lean proteins, low-fat dairy, and heart-healthy fats.  Work with your health care provider or diet and nutrition specialist (dietitian) to adjust your eating plan to your individual calorie needs. This information is not intended to replace advice given to you by your health care provider. Make sure you discuss any questions you have with your health care provider. Document Released: 02/14/2011 Document Revised: 02/19/2016 Document Reviewed: 02/19/2016 Elsevier Interactive Patient Education  2017 [employer].

## 2016-12-28 NOTE — Progress Notes (Signed)
BP (!) 168/104   Pulse 100   Temp 99 F (37.2 C) (Oral)   Ht 5' 4"  (1.626 m)   Wt 201 lb (91.2 kg)   LMP 07/07/2013   SpO2 98%   BMI 34.50 kg/m    CC: hypertension Subjective:    Patient ID: Courtney Burns, female    DOB: 14-Feb-1961, 56 y.o.   MRN: [id number]  HPI: Courtney Burns is a 56 y.o. female presenting on 12/28/2016 for Hypertension (elevated BP for a couple weeks, headaches started this week)   Over the past 2 weeks feeling off - headache (at vertex), feverish, and she has noticed that her blood pressure has been running elevated. bp at home has been 150/100s - she brings log today.   She denies vision changes, CP/tightness, SOB, leg swelling.  She denies any lifestyle or diet changes to contribute to blood pressure elevations.   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   She does take lisinopril 51m daily but predominantly for h/o diabetes.   Relevant past medical, surgical, family and social history reviewed and updated as indicated. Interim medical history since our last visit reviewed. Allergies and medications reviewed and updated. Outpatient Medications Prior to Visit  Medication Sig Dispense Refill  . atorvastatin (LIPITOR) 20 MG tablet Take 1 tablet (20 mg total) by mouth every evening. 90 tablet 3  . glucose blood (BAYER CONTOUR NEXT TEST) test strip Use as instructed to test blood sugar up to 3 times daily 100 each 5  . levothyroxine (SYNTHROID, LEVOTHROID) 75 MCG tablet TAKE 1 TABLET (75 MCG TOTAL) BY MOUTH EVERY MORNING. 90 tablet 1  . metFORMIN (GLUCOPHAGE) 1000 MG tablet TAKE 1 TABLET (1,000 MG TOTAL) BY MOUTH 2 (TWO) TIMES DAILY WITH A MEAL. 180 tablet 1  . PRESCRIPTION MEDICATION Eye injection for wet macular degeneration in [facility] left [facility]   . valACYclovir (VALTREX) 500 MG tablet Take 1 tablet (500 mg total) by mouth 2 (two) times daily. 6 tablet 3  .  lisinopril (PRINIVIL,ZESTRIL) 5 MG tablet TAKE 1 TABLET BY MOUTH EVERY DAY 90 tablet 3   No facility-administered medications prior to visit.      Per HPI unless specifically indicated in ROS section below Review of Systems     Objective:    BP (!) 168/104   Pulse 100   Temp 99 F (37.2 C) (Oral)   Ht 5' 4"  (1.626 m)   Wt 201 lb (91.2 kg)   LMP 07/07/2013   SpO2 98%   BMI 34.50 kg/m   Wt Readings from Last 3 Encounters:  12/28/16 201 lb (91.2 kg)  11/19/16 200 lb (90.7 kg)  11/04/16 200 lb (90.7 kg)    Physical Exam  Constitutional: She appears well-developed and well-nourished. No distress.  HENT:  Mouth/Throat: Oropharynx is clear and moist. No oropharyngeal exudate.  Cardiovascular: Normal rate, regular rhythm, normal heart sounds and intact distal pulses.   No murmur heard. Pulmonary/Chest: Effort normal and breath sounds normal. No respiratory distress. She has no wheezes. She has no rales.  Musculoskeletal: She exhibits no edema.  Skin: Skin is warm and dry. No rash noted.  Nursing note and vitals reviewed.  Results for orders placed or performed in visit on 11/04/16  IGP, Aptima HPV  Result Value Ref Range   DIAGNOSIS: Comment    Specimen adequacy: Comment    Clinician Provided ICD10 Comment  Performed by: Comment    PAP Smear Comment .    Note: Comment    Test Methodology Comment    HPV Aptima Negative Negative      Assessment & Plan:   Problem List Items Addressed This Visit    Essential hypertension - Primary    Elevated BP readings over the last 2 weeks without cause found. I did recommend she titrate lisinopril to 72m x2d then 218mdaily. RTC 10 days BMP check and f/u with PCP. Discussed dietary changes to improve hypertension control as well as provided with DASH diet handout. Pt agrees with plan.       Relevant Medications   lisinopril (PRINIVIL,ZESTRIL) 20 MG tablet   Other Relevant Orders   Basic metabolic panel       Follow up  plan: Return if symptoms worsen or fail to improve.  JaRia BushMD

## 2017-01-01 ENCOUNTER — Ambulatory Visit: Payer: Self-pay | Admitting: Primary Care

## 2017-01-07 ENCOUNTER — Encounter: Payer: Self-pay | Admitting: Family Medicine

## 2017-01-07 ENCOUNTER — Ambulatory Visit (INDEPENDENT_AMBULATORY_CARE_PROVIDER_SITE_OTHER): Payer: BLUE CROSS/BLUE SHIELD | Admitting: Family Medicine

## 2017-01-07 VITALS — BP 136/76 | HR 94 | Temp 98.3°F | Wt 205.0 lb

## 2017-01-07 DIAGNOSIS — I10 Essential (primary) hypertension: Secondary | ICD-10-CM

## 2017-01-07 DIAGNOSIS — E119 Type 2 diabetes mellitus without complications: Secondary | ICD-10-CM | POA: Diagnosis not present

## 2017-01-07 LAB — BASIC METABOLIC PANEL
BUN: 9 mg/dL (ref 6–23)
CO2: 30 mEq/L (ref 19–32)
Calcium: 9.7 mg/dL (ref 8.4–10.5)
Chloride: 104 mEq/L (ref 96–112)
Creatinine, Ser: 0.7 mg/dL (ref 0.40–1.20)
GFR: 91.46 mL/min (ref >60.00)
Glucose, Bld: 147 mg/dL — ABNORMAL HIGH (ref 70–99)
Potassium: 4.9 mEq/L (ref 3.5–5.1)
Sodium: 142 mEq/L (ref 135–145)

## 2017-01-07 LAB — TSH: TSH: 1.74 u[IU]/mL (ref 0.35–4.50)

## 2017-01-07 LAB — HEMOGLOBIN A1C: Hgb A1c MFr Bld: 7.2 % — ABNORMAL HIGH (ref 4.6–6.5)

## 2017-01-07 NOTE — Progress Notes (Signed)
BP 136/76 (BP Location: Right Arm, Cuff Size: Large)   Pulse 94   Temp 98.3 F (36.8 C) (Oral)   Wt 205 lb (93 kg)   LMP 07/07/2013   SpO2 95%   BMI 35.19 kg/m    CC: Saturday clinic f/u visit Subjective:    Patient ID: Courtney Burns, female    DOB: October 27, 1960, 56 y.o.   MRN: [id number]  HPI: Courtney Burns is a 56 y.o. female presenting on 01/07/2017 for Follow-up (BP has improved since med adjustment 12/28/16)   See Saturday clinic for details. Unclear trigger for elevated blood pressure readings but regardless we titrated lisinopril to 81m daily. Here for 10d f/u visit.   Improvement noted.  Increased stress at work.  She was rushing to get here today.   Relevant past medical, surgical, family and social history reviewed and updated as indicated. Interim medical history since our last visit reviewed. Allergies and medications reviewed and updated. Outpatient Medications Prior to Visit  Medication Sig Dispense Refill  . atorvastatin (LIPITOR) 20 MG tablet Take 1 tablet (20 mg total) by mouth every evening. 90 tablet 3  . glucose blood (BAYER CONTOUR NEXT TEST) test strip Use as instructed to test blood sugar up to 3 times daily 100 each 5  . levothyroxine (SYNTHROID, LEVOTHROID) 75 MCG tablet TAKE 1 TABLET (75 MCG TOTAL) BY MOUTH EVERY MORNING. 90 tablet 1  . lisinopril (PRINIVIL,ZESTRIL) 20 MG tablet Take 1 tablet (20 mg total) by mouth daily. 30 tablet 3  . metFORMIN (GLUCOPHAGE) 1000 MG tablet TAKE 1 TABLET (1,000 MG TOTAL) BY MOUTH 2 (TWO) TIMES DAILY WITH A MEAL. 180 tablet 1  . PRESCRIPTION MEDICATION Eye injection for wet macular degeneration in [facility] left eye monthly [facility]   . valACYclovir (VALTREX) 500 MG tablet Take 1 tablet (500 mg total) by mouth 2 (two) times daily. 6 tablet 3   No facility-administered medications prior to visit.      Per HPI unless specifically indicated in ROS section below Review of  Systems     Objective:    BP 136/76 (BP Location: Right Arm, Cuff Size: Large)   Pulse 94   Temp 98.3 F (36.8 C) (Oral)   Wt 205 lb (93 kg)   LMP 07/07/2013   SpO2 95%   BMI 35.19 kg/m   Wt Readings from Last 3 Encounters:  01/07/17 205 lb (93 kg)  12/28/16 201 lb (91.2 kg)  11/19/16 200 lb (90.7 kg)    Physical Exam  Constitutional: She appears well-developed and well-nourished. No distress.  Cardiovascular: Normal rate, regular rhythm, normal heart sounds and intact distal pulses.   No murmur heard. Pulmonary/Chest: Effort normal and breath sounds normal. No respiratory distress. She has no wheezes. She has no rales.  Musculoskeletal: She exhibits no edema.  Psychiatric: She has a normal mood and affect.  Nursing note and vitals reviewed.  Lab Results  Component Value Date   CREATININE 0.92 10/28/2016   Lab Results  Component Value Date   HGBA1C 7.1 (H) 05/09/2016      Assessment & Plan:   Problem List Items Addressed This Visit    Essential hypertension - Primary    Significant improvement on higher lisinopril 256mdose. Continue. Check Cr and TSH today. Consider baseline EKG next visit.       Relevant Orders   Basic metabolic panel   TSH   Type 2 diabetes mellitus ([place]  Update A1c.       Relevant Orders   Hemoglobin A1c       Follow up plan: Return if symptoms worsen or fail to improve.  Ria Bush, MD

## 2017-01-07 NOTE — Assessment & Plan Note (Signed)
Significant improvement on higher lisinopril 47m dose. Continue. Check Cr and TSH today. Consider baseline EKG next visit.

## 2017-01-07 NOTE — Patient Instructions (Addendum)
Continue lisinopril 26m daily. Let uKoreaknow if after 1-2 weeks blood pressures staying consistently >140/90.  Labs today.

## 2017-01-07 NOTE — Assessment & Plan Note (Signed)
Update A1c.

## 2017-04-23 ENCOUNTER — Other Ambulatory Visit: Payer: Self-pay | Admitting: Family Medicine

## 2017-05-01 ENCOUNTER — Other Ambulatory Visit: Payer: Self-pay | Admitting: Primary Care

## 2017-05-01 DIAGNOSIS — E119 Type 2 diabetes mellitus without complications: Secondary | ICD-10-CM

## 2017-05-01 DIAGNOSIS — E559 Vitamin D deficiency, unspecified: Secondary | ICD-10-CM

## 2017-05-01 DIAGNOSIS — E785 Hyperlipidemia, unspecified: Secondary | ICD-10-CM

## 2017-05-01 DIAGNOSIS — I10 Essential (primary) hypertension: Secondary | ICD-10-CM

## 2017-05-01 DIAGNOSIS — R945 Abnormal results of liver function studies: Secondary | ICD-10-CM

## 2017-05-01 DIAGNOSIS — R7989 Other specified abnormal findings of blood chemistry: Secondary | ICD-10-CM

## 2017-05-09 ENCOUNTER — Other Ambulatory Visit (INDEPENDENT_AMBULATORY_CARE_PROVIDER_SITE_OTHER): Payer: BLUE CROSS/BLUE SHIELD

## 2017-05-09 DIAGNOSIS — E559 Vitamin D deficiency, unspecified: Secondary | ICD-10-CM | POA: Diagnosis not present

## 2017-05-09 DIAGNOSIS — E119 Type 2 diabetes mellitus without complications: Secondary | ICD-10-CM | POA: Diagnosis not present

## 2017-05-09 DIAGNOSIS — E785 Hyperlipidemia, unspecified: Secondary | ICD-10-CM

## 2017-05-09 DIAGNOSIS — R945 Abnormal results of liver function studies: Secondary | ICD-10-CM | POA: Diagnosis not present

## 2017-05-09 DIAGNOSIS — R7989 Other specified abnormal findings of blood chemistry: Secondary | ICD-10-CM

## 2017-05-09 LAB — HEPATIC FUNCTION PANEL
ALT: 24 U/L (ref 0–35)
AST: 18 U/L (ref 0–37)
Albumin: 3.7 g/dL (ref 3.5–5.2)
Alkaline Phosphatase: 69 U/L (ref 39–117)
Bilirubin, Direct: 0.1 mg/dL (ref 0.0–0.3)
Total Bilirubin: 0.3 mg/dL (ref 0.2–1.2)
Total Protein: 6.5 g/dL (ref 6.0–8.3)

## 2017-05-09 LAB — LIPID PANEL
Cholesterol: 135 mg/dL (ref 0–200)
HDL: 30.4 mg/dL — ABNORMAL LOW (ref >39.00)
LDL Cholesterol: 69 mg/dL (ref 0–99)
NonHDL: 104.61
Total CHOL/HDL Ratio: 4
Triglycerides: 180 mg/dL — ABNORMAL HIGH (ref 0.0–149.0)
VLDL: 36 mg/dL (ref 0.0–40.0)

## 2017-05-09 LAB — VITAMIN D 25 HYDROXY (VIT D DEFICIENCY, FRACTURES): VITD: 11.94 ng/mL — ABNORMAL LOW (ref 30.00–100.00)

## 2017-05-09 LAB — HEMOGLOBIN A1C: Hgb A1c MFr Bld: 7.5 % — ABNORMAL HIGH (ref 4.6–6.5)

## 2017-05-16 ENCOUNTER — Ambulatory Visit (INDEPENDENT_AMBULATORY_CARE_PROVIDER_SITE_OTHER): Payer: BLUE CROSS/BLUE SHIELD | Admitting: Primary Care

## 2017-05-16 VITALS — BP 118/78 | HR 99 | Temp 98.2°F | Ht 64.0 in | Wt 206.0 lb

## 2017-05-16 DIAGNOSIS — E785 Hyperlipidemia, unspecified: Secondary | ICD-10-CM | POA: Diagnosis not present

## 2017-05-16 DIAGNOSIS — I10 Essential (primary) hypertension: Secondary | ICD-10-CM

## 2017-05-16 DIAGNOSIS — E039 Hypothyroidism, unspecified: Secondary | ICD-10-CM | POA: Diagnosis not present

## 2017-05-16 DIAGNOSIS — Z0000 Encounter for general adult medical examination without abnormal findings: Secondary | ICD-10-CM

## 2017-05-16 DIAGNOSIS — K7581 Nonalcoholic steatohepatitis (NASH): Secondary | ICD-10-CM | POA: Diagnosis not present

## 2017-05-16 DIAGNOSIS — E119 Type 2 diabetes mellitus without complications: Secondary | ICD-10-CM

## 2017-05-16 DIAGNOSIS — E559 Vitamin D deficiency, unspecified: Secondary | ICD-10-CM

## 2017-05-16 MED ORDER — VITAMIN D (ERGOCALCIFEROL) 1.25 MG (50000 UNIT) PO CAPS: ORAL_CAPSULE | ORAL | 0 refills | Status: DC

## 2017-05-16 NOTE — Assessment & Plan Note (Signed)
Immunizations UTD. Pap smear and mammogram UTD. Colonoscopy UTD. Discussed the importance of a healthy diet and regular exercise in order for weight loss, and to reduce the risk of any potential medical problems. Exam unremarkable. Labs overall stable. Follow up in 1 year.

## 2017-05-16 NOTE — Assessment & Plan Note (Signed)
Stable in the office today, continue lisinopril 20 mg. BMP unremarkable.

## 2017-05-16 NOTE — Patient Instructions (Signed)
Start vitamin D 50,000 unit capsules. Take 1 capsule by mouth once weekly for 12 weeks.  Start exercising. You should be getting 150 minutes of moderate intensity exercise weekly.  Continue to work on [employer] as discussed.   Schedule a lab only appointment in 3 months, you do not have to be fasting.  Please schedule a follow up appointment in 6 months.   It was a pleasure to see you today! Happy Birthday!

## 2017-05-16 NOTE — Progress Notes (Signed)
Subjective:    Patient ID: Courtney Burns, female    DOB: 08-18-1960, 57 y.o.   MRN: [id number]  HPI  [name] is a 57 year old female who presents today for complete physical.  Immunizations: -Tetanus: Completed in 2018 -Influenza: Completed this season -Pneumonia: Completed in 2018   Diet: She endorses a poor diet up until just recently. She started a new diet 5 weeks ago.  Breakfast: English muffin with egg, fruit Lunch: Grilled chicken, salad Dinner: Soup, meat, potatoes, vegetables  Snacks: Popcorn  Desserts: None Beverages: Diet soda, water  Exercise: She is currently not exercising Eye exam: Completes regular eye exams from macular degeneration  Dental exam: Completes semi-annually  Colonoscopy: Completed in 2017, due in 10 years Pap Smear: Completed in 2018 Mammogram: Completed in August 2018 Hep C Screen: Negative in 2017   Review of Systems  Constitutional: Negative for unexpected weight change.  HENT: Negative for rhinorrhea.   Respiratory: Negative for cough and shortness of breath.   Cardiovascular: Negative for chest pain.  Gastrointestinal: Negative for constipation and diarrhea.  Genitourinary: Negative for difficulty urinating and menstrual problem.  Musculoskeletal: Negative for arthralgias and myalgias.  Skin: Negative for rash.  Allergic/Immunologic: Negative for environmental allergies.  Neurological: Negative for dizziness, numbness and headaches.  Psychiatric/Behavioral: The patient is not nervous/anxious.        Past Medical History:  Diagnosis Date  . Diabetes mellitus without complication ([place])    type 2  . 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 GERD (gastroesophageal reflux disease)    Hx - diet controlled,  no meds  . History of chicken pox   . History of Papanicolaou smear of cervix 08/2013   neg per pt  . HSV infection   . Hypertension   . Hyperthyroidism   . Hypothyroidism    had radiation then thyroid  became low  . Ileitis   . Macular degeneration of left eye   . Vitamin D deficiency      Social History   Socioeconomic History  . Marital status: Married    Spouse name: Not on file  . Number of children: 0  . Years of education: 42  . Highest education level: Not on file  Social Needs  . Financial resource strain: Not on file  . Food insecurity - worry: Not on file  . Food insecurity - inability: Not on file  . Transportation needs - medical: Not on file  . Transportation needs - non-medical: Not on file  Occupational History  . 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 Smoking status: Never Smoker  . Smokeless tobacco: Never Used  Substance and Sexual Activity  . Alcohol use: No    Alcohol/week: 0.0 oz  . Drug use: No  . Sexual activity: Yes    Partners: Male    Birth control/protection: Post-menopausal  Other Topics Concern  . Not on file  Social History Narrative   Lives in [place].    Works for [employer].   [name] grew up in [place]. She lives in [place] with her husband [name]) and their 3 cat ([name], Campbell, [place]).    She attended [employer] and obtained her Bachelors in [employer] with a minor in Youth worker.    She loves cooking and sewing.    Past Surgical History:  Procedure Laterality Date  . APPENDECTOMY  1970  . BUNIONECTOMY Right 07/2001   right foot  . CHOLECYSTECTOMY  2005  . COLONOSCOPY  01/2004; 06/2015   Dr. Minette Headland, [place] - polyp; nl  . [place]   left salpingectomy  . RIGHT OOPHORECTOMY Right 03/2003   Abscessed cyst removed  . WISDOM TOOTH EXTRACTION      Family History  Problem Relation Age of Onset  . Hypertension Mother   . Diabetes Mother   . Cancer Mother 67       GBM  . Hyperlipidemia Father   . Heart disease Father        MI  . Hypertension Father   . Diabetes Father   .  Depression Father   . COPD Father   . Diabetes Maternal Aunt        Uncontrolled with complications  . Kidney disease Maternal Aunt        Renal failure - dialysis  . Stroke Maternal Grandmother   . Liver cancer Maternal Grandfather   . Cancer Maternal Grandfather 54       lung  . Heart disease Paternal Grandmother        MI  . Diabetes Paternal Grandmother   . Prostate cancer Paternal Grandfather 55  . Cancer - Prostate Paternal Grandfather   . Cancer Maternal Uncle 42       brain tumor  . Colon cancer Neg Hx   . Esophageal cancer Neg Hx   . Rectal cancer Neg Hx   . Stomach cancer Neg Hx     Allergies  Allergen Reactions  . Sulfa Antibiotics Rash    Current Outpatient Medications on File Prior to Visit  Medication Sig Dispense Refill  . atorvastatin (LIPITOR) 20 MG tablet Take 1 tablet (20 mg total) by mouth every evening. 90 tablet 3  . glucose blood (BAYER CONTOUR NEXT TEST) test strip Use as instructed to test blood sugar up to 3 times daily 100 each 5  . levothyroxine (SYNTHROID, LEVOTHROID) 75 MCG tablet TAKE 1 TABLET (75 MCG TOTAL) BY MOUTH EVERY MORNING. 90 tablet 1  . lisinopril (PRINIVIL,ZESTRIL) 20 MG tablet TAKE 1 TABLET BY MOUTH EVERY DAY 30 tablet 0  . metFORMIN (GLUCOPHAGE) 1000 MG tablet TAKE 1 TABLET (1,000 MG TOTAL) BY MOUTH 2 (TWO) TIMES DAILY WITH A MEAL. 180 tablet 1  . PRESCRIPTION MEDICATION Eye injection for wet macular degeneration in her left eye monthly for life, given at [facility]    . valACYclovir (VALTREX) 500 MG tablet Take 1 tablet (500 mg total) by mouth 2 (two) times daily. 6 tablet 3   No current facility-administered medications on file prior to visit.     BP 118/78   Pulse 99   Temp 98.2 F (36.8 C) (Oral)   Ht 5' 4"  (1.626 m)   Wt 206 lb (93.4 kg)   LMP 07/07/2013   SpO2 99%   BMI 35.36 kg/m    Objective:   Physical Exam  Constitutional: She is oriented to person, place, and time. She appears  well-nourished.  HENT:  Right Ear: Tympanic membrane and ear canal normal.  Left Ear: Tympanic membrane and ear canal normal.  Nose: Nose normal.  Mouth/Throat: Oropharynx is clear and moist.  Eyes: Conjunctivae and EOM are normal. Pupils are equal, round, and reactive to light.  Neck: Neck supple. No thyromegaly present.  Cardiovascular: Normal rate and regular rhythm.  No murmur heard. Pulmonary/Chest: Effort normal and breath sounds normal.  She has no rales.  Abdominal: Soft. Bowel sounds are normal. There is no tenderness.  Musculoskeletal: Normal range of motion.  Lymphadenopathy:    She has no cervical adenopathy.  Neurological: She is alert and oriented to person, place, and time. She has normal reflexes. No cranial nerve deficit.  Skin: Skin is warm and dry. No rash noted.  Psychiatric: She has a normal mood and affect.          Assessment & Plan:

## 2017-05-16 NOTE — Assessment & Plan Note (Signed)
LFT's unremarkable.

## 2017-05-16 NOTE — Assessment & Plan Note (Signed)
Discussed the importance of a healthy diet and regular exercise in order for weight loss, and to reduce the risk of any potential medical problems.

## 2017-05-16 NOTE — Assessment & Plan Note (Signed)
Trigs slightly above goal, LDL at goal. Continue atorvastatin.

## 2017-05-16 NOTE — Assessment & Plan Note (Signed)
TSH stable from October 2018, repeat in 3 months. Continue levothyroxine 75 mcg.

## 2017-05-16 NOTE — Assessment & Plan Note (Signed)
Level of 11 on recent labs, is not taking Vitamin D. Will start with 50,000 unit capsules once weekly, then recheck in 3 months.

## 2017-05-16 NOTE — Assessment & Plan Note (Signed)
Slight increase in A1C when compared to last check. She has recently changed her diet and plans on exercising. Will continue current regimen of Metformin 1000 mg BID, have her work on weight loss through diet and exercise, repeat A1C in 3 months.  Eye and foot exam UTD. Pneumovax UTD. Managed on statin and ACE. Follow up in 6 months.

## 2017-05-25 ENCOUNTER — Other Ambulatory Visit: Payer: Self-pay | Admitting: Family Medicine

## 2017-06-15 ENCOUNTER — Other Ambulatory Visit: Payer: Self-pay | Admitting: Primary Care

## 2017-06-15 DIAGNOSIS — E785 Hyperlipidemia, unspecified: Secondary | ICD-10-CM

## 2017-07-03 ENCOUNTER — Other Ambulatory Visit: Payer: Self-pay | Admitting: Primary Care

## 2017-08-07 ENCOUNTER — Other Ambulatory Visit: Payer: Self-pay | Admitting: Primary Care

## 2017-08-14 ENCOUNTER — Other Ambulatory Visit (INDEPENDENT_AMBULATORY_CARE_PROVIDER_SITE_OTHER): Payer: BLUE CROSS/BLUE SHIELD

## 2017-08-14 ENCOUNTER — Other Ambulatory Visit: Payer: Self-pay | Admitting: Primary Care

## 2017-08-14 DIAGNOSIS — E1165 Type 2 diabetes mellitus with hyperglycemia: Secondary | ICD-10-CM | POA: Diagnosis not present

## 2017-08-14 DIAGNOSIS — E559 Vitamin D deficiency, unspecified: Secondary | ICD-10-CM

## 2017-08-14 DIAGNOSIS — E039 Hypothyroidism, unspecified: Secondary | ICD-10-CM

## 2017-08-14 LAB — VITAMIN D 25 HYDROXY (VIT D DEFICIENCY, FRACTURES): VITD: 20.83 ng/mL — ABNORMAL LOW (ref 30.00–100.00)

## 2017-08-14 LAB — HEMOGLOBIN A1C: Hgb A1c MFr Bld: 7.6 % — ABNORMAL HIGH (ref 4.6–6.5)

## 2017-08-14 LAB — TSH: TSH: 2.06 u[IU]/mL (ref 0.35–4.50)

## 2017-08-14 MED ORDER — VITAMIN D (ERGOCALCIFEROL) 1.25 MG (50000 UNIT) PO CAPS
ORAL_CAPSULE | ORAL | 0 refills | Status: DC
Start: 2017-08-14 — End: 2017-11-25

## 2017-09-27 ENCOUNTER — Other Ambulatory Visit: Payer: Self-pay | Admitting: Primary Care

## 2017-11-04 ENCOUNTER — Other Ambulatory Visit: Payer: Self-pay | Admitting: Primary Care

## 2017-11-04 DIAGNOSIS — E559 Vitamin D deficiency, unspecified: Secondary | ICD-10-CM

## 2017-11-04 NOTE — Telephone Encounter (Signed)
Last prescribed on  08/14/2017 Last office visit on 05/16/2017. Next follow up on 11/13/2017

## 2017-11-04 NOTE — Telephone Encounter (Signed)
Noted, will evaluate and repeat vitamin d labs during upcoming visit.

## 2017-11-13 ENCOUNTER — Ambulatory Visit: Payer: BLUE CROSS/BLUE SHIELD | Admitting: Primary Care

## 2017-11-25 ENCOUNTER — Other Ambulatory Visit: Payer: Self-pay | Admitting: Primary Care

## 2017-11-25 ENCOUNTER — Ambulatory Visit: Payer: BLUE CROSS/BLUE SHIELD | Admitting: Primary Care

## 2017-11-25 ENCOUNTER — Encounter: Payer: Self-pay | Admitting: Primary Care

## 2017-11-25 VITALS — BP 134/80 | HR 83 | Temp 98.1°F | Ht 64.0 in | Wt 209.8 lb

## 2017-11-25 DIAGNOSIS — E559 Vitamin D deficiency, unspecified: Secondary | ICD-10-CM

## 2017-11-25 DIAGNOSIS — E119 Type 2 diabetes mellitus without complications: Secondary | ICD-10-CM

## 2017-11-25 LAB — HEMOGLOBIN A1C: Hgb A1c MFr Bld: 7.7 % — ABNORMAL HIGH (ref 4.6–6.5)

## 2017-11-25 LAB — VITAMIN D 25 HYDROXY (VIT D DEFICIENCY, FRACTURES): VITD: 16.82 ng/mL — ABNORMAL LOW (ref 30.00–100.00)

## 2017-11-25 MED ORDER — VITAMIN D (ERGOCALCIFEROL) 1.25 MG (50000 UNIT) PO CAPS
ORAL_CAPSULE | ORAL | 0 refills | Status: DC
Start: 2017-11-25 — End: 2018-02-13

## 2017-11-25 NOTE — Patient Instructions (Addendum)
Stop by the lab prior to leaving today. I will notify you of your results once received.   Start exercising. You should be getting 150 minutes of moderate intensity exercise weekly.  Continue to work on [employer], be strong!  Ensure you are consuming 64 ounces of water daily.  Please schedule a physical with me in 6 months. You may also schedule a lab only appointment 3-4 days prior. We will discuss your lab results in detail during your physical.  It was a pleasure to see you today!

## 2017-11-25 NOTE — Assessment & Plan Note (Signed)
Repeat A1C pending. She plans on starting to exercise regularly, recently joined the gym. Commended her on this. Discussed to continue with a healthy diet.  Continue Metformin 1000 mg BID for now, consider adding in Glipizide if needed. Would like to see A1C below 7.  Follow up in 6 months or sooner depending on lab results.

## 2017-11-25 NOTE — Progress Notes (Signed)
Subjective:    Patient ID: Courtney Burns, female    DOB: [birth year], 57 y.o.   MRN: [id number]  HPI  Courtney Burns is a 57 year old female who presents today for follow up of type 2 diabetes.  Current medications include: Metformin 1000 mg BID.  She is checking her blood glucose once daily and is getting readings of: AM fasting: 140-170's  Last A1C: 7.6 in June 2019 Last Eye Exam: Completed in September 2019 Last Foot Exam: Due in March 2020 Pneumonia Vaccination: Completed in 2018 ACE/ARB: ACE Statin: atorvastatin   Diet currently consists of:  Breakfast: Fast food (healtier choices), peanut butter sandwich, baked oatmeal with fruit Lunch: Grilled protein, vegetables, limited starches Dinner: Meat, vegetables, little starch Snacks: Occasionally fruit Desserts: None Beverages: Diet soda, water  Exercise: She recently joined the gym and plans on working out soon.    Wt Readings from Last 3 Encounters:  11/25/17 209 lb 12 oz (95.1 kg)  05/16/17 206 lb (93.4 kg)  01/07/17 205 lb (93 kg)   BP Readings from Last 3 Encounters:  11/25/17 134/80  05/16/17 118/78  01/07/17 136/76       Review of Systems  Respiratory: Negative for shortness of breath.   Cardiovascular: Negative for chest pain.  Neurological: Negative for dizziness and numbness.       Past Medical History:  Diagnosis Date  . Diabetes mellitus without complication ([place])    type 2  . 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 GERD (gastroesophageal reflux disease)    Hx - diet controlled,  no meds  . History of chicken pox   . History of Papanicolaou smear of cervix 08/2013   neg per pt  . HSV infection   . Hypertension   . Hyperthyroidism   . Hypothyroidism    had radiation then thyroid became low  . Ileitis   . Macular degeneration of left eye   . Vitamin D deficiency      Social History   Socioeconomic History  . Marital status: Married    Spouse name: Not on file  .  Number of children: 0  . Years of education: 34  . Highest education level: Not on file  Occupational History  . 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 Financial resource strain: Not on file  . Food insecurity:    Worry: Not on file    Inability: Not on file  . Transportation needs:    Medical: Not on file    Non-medical: Not on file  Tobacco Use  . Smoking status: Never Smoker  . Smokeless tobacco: Never Used  Substance and Sexual Activity  . Alcohol use: No    Alcohol/week: 0.0 standard drinks  . Drug use: No  . Sexual activity: Yes    Partners: Male    Birth control/protection: Post-menopausal  Lifestyle  . Physical activity:    Days per week: Not on file    Minutes per session: Not on file  . Stress: Not on file  Relationships  . Social connections:    Talks on phone: Not on file    Gets together: Not on file    Attends religious service: Not on file    Active member of club or organization: Not on file    Attends meetings of clubs or organizations: Not on file    Relationship status: Not on file  .  Intimate partner violence:    Fear of current or ex partner: Not on file    Emotionally abused: Not on file    Physically abused: Not on file    Forced sexual activity: Not on file  Other Topics Concern  . Not on file  Social History Narrative   Lives in [place].    Works for [employer].   Courtney Burns grew up in [place]. She lives in [place] with her husband Courtney Burns) and their 3 cat (Courtney Burns, Courtney Burns, Courtney Burns).    She attended [employer] and obtained her Bachelors in [employer] with a minor in Youth worker.    She loves cooking and sewing.    Past Surgical History:  Procedure Laterality Date  . APPENDECTOMY  1970  . BUNIONECTOMY Right 07/2001   right foot  . CHOLECYSTECTOMY  2005  . COLONOSCOPY  01/2004; 06/2015   Dr. Minette Headland, [place] - polyp; nl  .  [place]   left salpingectomy  . RIGHT OOPHORECTOMY Right 03/2003   Abscessed cyst removed  . WISDOM TOOTH EXTRACTION      Family History  Problem Relation Age of Onset  . Hypertension Mother   . Diabetes Mother   . Cancer Mother 85       GBM  . Hyperlipidemia Father   . Heart disease Father        MI  . Hypertension Father   . Diabetes Father   . Depression Father   . COPD Father   . Diabetes Maternal Aunt        Uncontrolled with complications  . Kidney disease Maternal Aunt        Renal failure - dialysis  . Stroke Maternal Grandmother   . Liver cancer Maternal Grandfather   . Cancer Maternal Grandfather 54       lung  . Heart disease Paternal Grandmother        MI  . Diabetes Paternal Grandmother   . Prostate cancer Paternal Grandfather 58  . Cancer - Prostate Paternal Grandfather   . Cancer Maternal Uncle 42       brain tumor  . Colon cancer Neg Hx   . Esophageal cancer Neg Hx   . Rectal cancer Neg Hx   . Stomach cancer Neg Hx     Allergies  Allergen Reactions  . Sulfa Antibiotics Rash    Current Outpatient Medications on File Prior to Visit  Medication Sig Dispense Refill  . atorvastatin (LIPITOR) 20 MG tablet TAKE 1 TABLET (20 MG TOTAL) BY MOUTH EVERY EVENING. 90 tablet 1  . glucose blood (BAYER CONTOUR NEXT TEST) test strip Use as instructed to test blood sugar up to 3 times daily 100 each 5  . levothyroxine (SYNTHROID, LEVOTHROID) 75 MCG tablet TAKE 1 TABLET (75 MCG TOTAL) BY MOUTH EVERY MORNING. 90 tablet 1  . lisinopril (PRINIVIL,ZESTRIL) 20 MG tablet TAKE 1 TABLET BY MOUTH EVERY DAY 30 tablet 5  . metFORMIN (GLUCOPHAGE) 1000 MG tablet TAKE 1 TABLET (1,000 MG TOTAL) BY MOUTH 2 (TWO) TIMES DAILY WITH A MEAL. 180 tablet 1  . 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 valACYclovir (VALTREX) 500 MG tablet Take 1 tablet (500  mg total) by mouth 2 (two) times daily. 6 tablet 3  . Vitamin D, Ergocalciferol, (DRISDOL) 50000 units CAPS capsule Take 1 capsule by mouth once  weekly for 12 weeks. 12 capsule 0   No current facility-administered medications on file prior to visit.     BP 134/80   Pulse 83   Temp 98.1 F (36.7 C) (Oral)   Ht 5' 4"  (1.626 m)   Wt 209 lb 12 oz (95.1 kg)   LMP 07/07/2013   SpO2 98%   BMI 36.00 kg/m    Objective:   Physical Exam  Constitutional: She appears well-nourished.  Neck: Neck supple.  Cardiovascular: Normal rate and regular rhythm.  Respiratory: Effort normal and breath sounds normal.  Skin: Skin is warm and dry.           Assessment & Plan:

## 2017-11-25 NOTE — Assessment & Plan Note (Signed)
Completed vitamin D 50,000 units weekly. Repeat vitamin D pending.

## 2017-11-26 MED ORDER — GLIPIZIDE 5 MG PO TABS: ORAL_TABLET | ORAL | 1 refills | Status: DC

## 2017-12-13 ENCOUNTER — Other Ambulatory Visit: Payer: Self-pay | Admitting: Primary Care

## 2017-12-13 DIAGNOSIS — E785 Hyperlipidemia, unspecified: Secondary | ICD-10-CM

## 2017-12-16 ENCOUNTER — Other Ambulatory Visit: Payer: Self-pay | Admitting: Primary Care

## 2018-01-14 ENCOUNTER — Other Ambulatory Visit: Payer: Self-pay | Admitting: Primary Care

## 2018-01-30 ENCOUNTER — Other Ambulatory Visit: Payer: Self-pay | Admitting: Primary Care

## 2018-02-13 ENCOUNTER — Other Ambulatory Visit: Payer: Self-pay | Admitting: Primary Care

## 2018-02-13 DIAGNOSIS — E559 Vitamin D deficiency, unspecified: Secondary | ICD-10-CM

## 2018-02-13 NOTE — Telephone Encounter (Signed)
Noted, refill sent to pharmacy. 

## 2018-02-13 NOTE — Telephone Encounter (Signed)
Last prescribed and seen on 11/25/2017

## 2018-05-03 ENCOUNTER — Other Ambulatory Visit: Payer: Self-pay | Admitting: Primary Care

## 2018-05-03 DIAGNOSIS — A6000 Herpesviral infection of urogenital system, unspecified: Secondary | ICD-10-CM

## 2018-05-13 ENCOUNTER — Other Ambulatory Visit: Payer: Self-pay | Admitting: Primary Care

## 2018-05-13 DIAGNOSIS — E039 Hypothyroidism, unspecified: Secondary | ICD-10-CM

## 2018-05-13 DIAGNOSIS — I10 Essential (primary) hypertension: Secondary | ICD-10-CM

## 2018-05-13 DIAGNOSIS — E785 Hyperlipidemia, unspecified: Secondary | ICD-10-CM

## 2018-05-13 DIAGNOSIS — E119 Type 2 diabetes mellitus without complications: Secondary | ICD-10-CM

## 2018-05-13 DIAGNOSIS — E559 Vitamin D deficiency, unspecified: Secondary | ICD-10-CM

## 2018-05-19 ENCOUNTER — Other Ambulatory Visit: Payer: BLUE CROSS/BLUE SHIELD

## 2018-05-21 ENCOUNTER — Encounter: Payer: BLUE CROSS/BLUE SHIELD | Admitting: Primary Care

## 2018-05-26 ENCOUNTER — Telehealth: Payer: Self-pay | Admitting: Primary Care

## 2018-05-26 NOTE — Telephone Encounter (Signed)
Left message for patient to return my call.  Need to ask patient to reschedule appointment.

## 2018-05-27 NOTE — Telephone Encounter (Signed)
Patient has been reschedule

## 2018-05-28 ENCOUNTER — Other Ambulatory Visit: Payer: BLUE CROSS/BLUE SHIELD

## 2018-06-02 ENCOUNTER — Encounter: Payer: BLUE CROSS/BLUE SHIELD | Admitting: Primary Care

## 2018-07-11 ENCOUNTER — Other Ambulatory Visit: Payer: Self-pay | Admitting: Primary Care

## 2018-07-14 ENCOUNTER — Other Ambulatory Visit: Payer: Self-pay | Admitting: Primary Care

## 2018-07-29 ENCOUNTER — Other Ambulatory Visit: Payer: BLUE CROSS/BLUE SHIELD

## 2018-07-30 ENCOUNTER — Other Ambulatory Visit (INDEPENDENT_AMBULATORY_CARE_PROVIDER_SITE_OTHER): Payer: BLUE CROSS/BLUE SHIELD

## 2018-07-30 DIAGNOSIS — E785 Hyperlipidemia, unspecified: Secondary | ICD-10-CM | POA: Diagnosis not present

## 2018-07-30 DIAGNOSIS — E039 Hypothyroidism, unspecified: Secondary | ICD-10-CM | POA: Diagnosis not present

## 2018-07-30 DIAGNOSIS — E559 Vitamin D deficiency, unspecified: Secondary | ICD-10-CM

## 2018-07-30 DIAGNOSIS — E119 Type 2 diabetes mellitus without complications: Secondary | ICD-10-CM | POA: Diagnosis not present

## 2018-07-30 LAB — COMPREHENSIVE METABOLIC PANEL
ALT: 74 U/L — ABNORMAL HIGH (ref 0–35)
AST: 49 U/L — ABNORMAL HIGH (ref 0–37)
Albumin: 4.2 g/dL (ref 3.5–5.2)
Alkaline Phosphatase: 89 U/L (ref 39–117)
BUN: 12 mg/dL (ref 6–23)
CO2: 26 mEq/L (ref 19–32)
Calcium: 9.1 mg/dL (ref 8.4–10.5)
Chloride: 102 mEq/L (ref 96–112)
Creatinine, Ser: 0.7 mg/dL (ref 0.40–1.20)
GFR: 85.59 mL/min (ref >60.00)
Glucose, Bld: 218 mg/dL — ABNORMAL HIGH (ref 70–99)
Potassium: 4.2 mEq/L (ref 3.5–5.1)
Sodium: 140 mEq/L (ref 135–145)
Total Bilirubin: 0.5 mg/dL (ref 0.2–1.2)
Total Protein: 6.8 g/dL (ref 6.0–8.3)

## 2018-07-30 LAB — LIPID PANEL
Cholesterol: 129 mg/dL (ref 0–200)
HDL: 34.4 mg/dL — ABNORMAL LOW (ref >39.00)
LDL Cholesterol: 58 mg/dL (ref 0–99)
NonHDL: 94.98
Total CHOL/HDL Ratio: 4
Triglycerides: 187 mg/dL — ABNORMAL HIGH (ref 0.0–149.0)
VLDL: 37.4 mg/dL (ref 0.0–40.0)

## 2018-07-30 LAB — HEMOGLOBIN A1C: Hgb A1c MFr Bld: 9.1 % — ABNORMAL HIGH (ref 4.6–6.5)

## 2018-07-30 LAB — TSH: TSH: 2.17 u[IU]/mL (ref 0.35–4.50)

## 2018-07-30 LAB — VITAMIN D 25 HYDROXY (VIT D DEFICIENCY, FRACTURES): VITD: 13.81 ng/mL — ABNORMAL LOW (ref 30.00–100.00)

## 2018-08-02 ENCOUNTER — Other Ambulatory Visit: Payer: Self-pay | Admitting: Primary Care

## 2018-08-05 ENCOUNTER — Encounter: Payer: Self-pay | Admitting: Primary Care

## 2018-08-05 ENCOUNTER — Ambulatory Visit (INDEPENDENT_AMBULATORY_CARE_PROVIDER_SITE_OTHER): Payer: BLUE CROSS/BLUE SHIELD | Admitting: Primary Care

## 2018-08-05 VITALS — BP 147/81 | Temp 98.0°F | Ht 64.0 in | Wt 208.0 lb

## 2018-08-05 DIAGNOSIS — E785 Hyperlipidemia, unspecified: Secondary | ICD-10-CM

## 2018-08-05 DIAGNOSIS — I10 Essential (primary) hypertension: Secondary | ICD-10-CM | POA: Diagnosis not present

## 2018-08-05 DIAGNOSIS — Z1239 Encounter for other screening for malignant neoplasm of breast: Secondary | ICD-10-CM

## 2018-08-05 DIAGNOSIS — E559 Vitamin D deficiency, unspecified: Secondary | ICD-10-CM

## 2018-08-05 DIAGNOSIS — R945 Abnormal results of liver function studies: Secondary | ICD-10-CM

## 2018-08-05 DIAGNOSIS — Z0000 Encounter for general adult medical examination without abnormal findings: Secondary | ICD-10-CM

## 2018-08-05 DIAGNOSIS — E119 Type 2 diabetes mellitus without complications: Secondary | ICD-10-CM

## 2018-08-05 DIAGNOSIS — K7581 Nonalcoholic steatohepatitis (NASH): Secondary | ICD-10-CM

## 2018-08-05 DIAGNOSIS — R7989 Other specified abnormal findings of blood chemistry: Secondary | ICD-10-CM

## 2018-08-05 DIAGNOSIS — E039 Hypothyroidism, unspecified: Secondary | ICD-10-CM

## 2018-08-05 MED ORDER — VITAMIN D (ERGOCALCIFEROL) 1.25 MG (50000 UNIT) PO CAPS: ORAL_CAPSULE | ORAL | 1 refills | Status: DC

## 2018-08-05 NOTE — Assessment & Plan Note (Signed)
She is taking levothyroxine with her other medications in the morning. She does wait to eat for 30 minutes. Repeat TSH is stable.  Discussed to take levothyroxine every morning on an empty stomach with water only. No food or other medications for 30 minutes. No heartburn medication, iron pills, calcium, vitamin D, or magnesium pills within four hours of taking levothyroxine. She verbalized understanding.   Continue levothyroxine 75 mcg.

## 2018-08-05 NOTE — Assessment & Plan Note (Addendum)
Recent LFT's slightly elevated, this is likely secondary to dietary changes. She's already working towards improving her diet and seems motivated so we will monitor and repeat in three months.

## 2018-08-05 NOTE — Assessment & Plan Note (Signed)
Recent LFT's slightly elevated, this is likely secondary to dietary changes. She's already working towards improving her diet and seems motivated so we will monitor and repeat in three months.

## 2018-08-05 NOTE — Assessment & Plan Note (Signed)
Recent vitamin D level too low. Resume weekly Rx 50,000 units. Repeat in 3 months. New Rx sent to pharmacy.

## 2018-08-05 NOTE — Assessment & Plan Note (Signed)
Immunizations UTD. Mammogram due, orders placed. Pap smear UTD, due in 2021. She does see GYN. Discussed the importance of a healthy diet and regular exercise in order for weight loss, and to reduce the risk of any potential medical problems. Virtual exam unremarkable.  Labs reviewed. Follow up in 1 year for CPE.

## 2018-08-05 NOTE — Assessment & Plan Note (Addendum)
Recent increase in A1C to 9.1. she is upset and understands the importance of working towards reducing.   She's already working towards improving her diet and seems motivated so we will continue her current regimen and repeat A1C in 3 months. She has been on Glipizide in the past and could not tolerate.   Managed on statin and ACE. Pneumonia vaccination UTD. Eye exam and foot exam UTD.  Repeat A1C in 3 months, follow up in 6 months.

## 2018-08-05 NOTE — Assessment & Plan Note (Signed)
BP today was taken one month ago, she's not checking regularly. Discussed to monitor BP closer and to report readings that are consistently at or above 135/90. Continue lisinopril.

## 2018-08-05 NOTE — Patient Instructions (Signed)
It is important that you improve your diet. Please limit carbohydrates in the form of white bread, rice, pasta, sweets, fast food, fried food, sugary drinks, etc. Increase your consumption of fresh fruits and vegetables, whole grains, lean protein.  Ensure you are consuming 64 ounces of water daily.  Start exercising. You should be getting 150 minutes of moderate intensity exercise weekly.  Continue Metformin 1000 mg twice daily for diabetes.   Monitor your blood pressure more closely and notify me if you see readings at or above 135/90 on a consistent basis.   Schedule a lab only appointment for 3 months and a follow up visit for 6 months.  It was a pleasure to see you today! Allie Bossier, NP-C

## 2018-08-05 NOTE — Assessment & Plan Note (Signed)
Recent lipid panel stable, continue atorvastatin.

## 2018-08-05 NOTE — Progress Notes (Signed)
Subjective:    Patient ID: Courtney Burns, female    DOB: October 09, 1960, 58 y.o.   MRN: [id number]  HPI  Virtual Visit via Video Note  I connected with Courtney Burns on 08/05/18 at  2:40 PM EDT by a video enabled telemedicine application and verified that I am speaking with the correct person using two identifiers.  Location: Patient: Home Provider: Office   I discussed the limitations of evaluation and management by telemedicine and the availability of in person appointments. The patient expressed understanding and agreed to proceed.  History of Present Illness:  Courtney Burns is a 58 year old female who presents today for complete physical.  Immunizations: -Tetanus: Completed in 2018 -Influenza: Due this season -Pneumonia: Completed in 2018  Diet: She endorses a fair diet but has been eating more baked goods. She endorses eating biscuits, hamburgers, bacon, fruit, protein, some vegetables. Since she saw her recent labs she's worked on cutting back on baked foods and biscuits.   Exercise: She is not exercising.  Eye exam: Due in Fall 2020 Colonoscopy: Completed in 2017, due in 2027 Pap Smear: Completed in August 2018 Mammogram: Completed in 2018  BP Readings from Last 3 Encounters:  08/05/18 (!) 147/81  11/25/17 134/80  05/16/17 118/78   She's not checking her blood pressure regularly, only checks when she's feeling bad. Her last BP check    Observations/Objective:  Alert and oriented. Appears well, not sickly. No distress. Speaking in complete sentences.   Assessment and Plan:  See problem based charting.  Follow Up Instructions:  It is important that you improve your diet. Please limit carbohydrates in the form of white bread, rice, pasta, sweets, fast food, fried food, sugary drinks, etc. Increase your consumption of fresh fruits and vegetables, whole grains, lean protein.  Ensure you are consuming 64 ounces of water daily.  Start exercising. You should  be getting 150 minutes of moderate intensity exercise weekly.  Continue Metformin 1000 mg twice daily for diabetes.   Monitor your blood pressure more closely and notify me if you see readings at or above 135/90 on a consistent basis.   Schedule a lab only appointment for 3 months and a follow up visit for 6 months.  It was a pleasure to see you today! Courtney Bossier, NP-C    I discussed the assessment and treatment plan with the patient. The patient was provided an opportunity to ask questions and all were answered. The patient agreed with the plan and demonstrated an understanding of the instructions.   The patient was advised to call back or seek an in-person evaluation if the symptoms worsen or if the condition fails to improve as anticipated.     Courtney Koch, NP    Review of Systems  Constitutional: Negative for unexpected weight change.  HENT: Negative for rhinorrhea.   Respiratory: Negative for cough and shortness of breath.   Cardiovascular: Negative for chest pain.  Gastrointestinal: Negative for constipation and diarrhea.  Genitourinary: Negative for difficulty urinating and menstrual problem.  Musculoskeletal: Negative for arthralgias and myalgias.  Skin: Negative for rash.  Allergic/Immunologic: Negative for environmental allergies.  Neurological: Negative for dizziness, numbness and headaches.  Psychiatric/Behavioral: The patient is not nervous/anxious.        Past Medical History:  Diagnosis Date  . Diabetes mellitus without complication ([place])    type 2  . 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 GERD (gastroesophageal reflux disease)    Hx -  diet controlled,  no meds  . History of chicken pox   . History of Papanicolaou smear of cervix 08/2013   neg per pt  . HSV infection   . Hypertension   . Hyperthyroidism   . Hypothyroidism    had radiation then thyroid became low  . Ileitis   . Macular degeneration of left eye   . Vitamin D  deficiency      Social History   Socioeconomic History  . Marital status: Married    Spouse name: Not on file  . Number of children: 0  . Years of education: 52  . Highest education level: Not on file  Occupational History  . 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 Financial resource strain: Not on file  . Food insecurity:    Worry: Not on file    Inability: Not on file  . Transportation needs:    Medical: Not on file    Non-medical: Not on file  Tobacco Use  . Smoking status: Never Smoker  . Smokeless tobacco: Never Used  Substance and Sexual Activity  . Alcohol use: No    Alcohol/week: 0.0 standard drinks  . Drug use: No  . Sexual activity: Yes    Partners: Male    Birth control/protection: Post-menopausal  Lifestyle  . Physical activity:    Days per week: Not on file    Minutes per session: Not on file  . Stress: Not on file  Relationships  . Social connections:    Talks on phone: Not on file    Gets together: Not on file    Attends religious service: Not on file    Active member of club or organization: Not on file    Attends meetings of clubs or organizations: Not on file    Relationship status: Not on file  . Intimate partner violence:    Fear of current or ex partner: Not on file    Emotionally abused: Not on file    Physically abused: Not on file    Forced sexual activity: Not on file  Other Topics Concern  . Not on file  Social History Narrative   Lives in [place].    Works for [employer].   Courtney Burns grew up in [place]. She lives in [place] with her husband Courtney Burns) and their 3 cat (Courtney Burns, Courtney Burns, Courtney Burns).    She attended [employer] and obtained her Bachelors in [employer] with a minor in Youth worker.    She loves cooking and sewing.    Past Surgical History:  Procedure Laterality Date  . APPENDECTOMY  1970  . BUNIONECTOMY Right  07/2001   right foot  . CHOLECYSTECTOMY  2005  . COLONOSCOPY  01/2004; 06/2015   Dr. Minette Headland, [place] - polyp; nl  . [place]   left salpingectomy  . RIGHT OOPHORECTOMY Right 03/2003   Abscessed cyst removed  . WISDOM TOOTH EXTRACTION      Family History  Problem Relation Age of Onset  . Hypertension Mother   . Diabetes Mother   . Cancer Mother 7       GBM  . Hyperlipidemia Father   . Heart disease Father        MI  . Hypertension Father   . Diabetes Father   . Depression Father   . COPD Father   . Diabetes Maternal Aunt  Uncontrolled with complications  . Kidney disease Maternal Aunt        Renal failure - dialysis  . Stroke Maternal Grandmother   . Liver cancer Maternal Grandfather   . Cancer Maternal Grandfather 54       lung  . Heart disease Paternal Grandmother        MI  . Diabetes Paternal Grandmother   . Prostate cancer Paternal Grandfather 39  . Cancer - Prostate Paternal Grandfather   . Cancer Maternal Uncle 42       brain tumor  . Colon cancer Neg Hx   . Esophageal cancer Neg Hx   . Rectal cancer Neg Hx   . Stomach cancer Neg Hx     Allergies  Allergen Reactions  . Glipizide Itching  . Sulfa Antibiotics Rash    Current Outpatient Medications on File Prior to Visit  Medication Sig Dispense Refill  . atorvastatin (LIPITOR) 20 MG tablet TAKE 1 TABLET (20 MG TOTAL) BY MOUTH EVERY EVENING. 90 tablet 1  . glucose blood (BAYER CONTOUR NEXT TEST) test strip Use as instructed to test blood sugar up to 3 times daily 100 each 5  . levothyroxine (SYNTHROID) 75 MCG tablet TAKE 1 TABLET (75 MCG TOTAL) BY MOUTH EVERY MORNING. 90 tablet 1  . lisinopril (ZESTRIL) 20 MG tablet TAKE 1 TABLET BY MOUTH EVERY DAY 90 tablet 1  . metFORMIN (GLUCOPHAGE) 1000 MG tablet TAKE 1 TABLET (1,000 MG TOTAL) BY MOUTH 2 (TWO) TIMES DAILY WITH A MEAL. 180 tablet 0  . PRESCRIPTION MEDICATION Eye injection for wet macular degeneration in her left eye  monthly for life, given at [facility] in [facility]    . valACYclovir (VALTREX) 500 MG tablet TAKE 1 TABLET (500 MG TOTAL) BY MOUTH 2 (TWO) TIMES DAILY. 6 tablet 0   No current facility-administered medications on file prior to visit.     BP (!) 147/81   Temp 98 F (36.7 C) (Temporal)   Ht 5' 4"  (1.626 m)   Wt 208 lb (94.3 kg)   LMP 07/07/2013   BMI 35.70 kg/m     Objective:   Physical Exam  Constitutional: She is oriented to person, place, and time. She appears well-nourished.  Respiratory: Effort normal.  Musculoskeletal: Normal range of motion.  Neurological: She is alert and oriented to person, place, and time.  Skin: Skin is dry.  Psychiatric: She has a normal mood and affect.           Assessment & Plan:

## 2018-08-19 ENCOUNTER — Other Ambulatory Visit: Payer: Self-pay

## 2018-08-19 ENCOUNTER — Encounter (INDEPENDENT_AMBULATORY_CARE_PROVIDER_SITE_OTHER): Payer: BC Managed Care – PPO | Admitting: Ophthalmology

## 2018-08-19 DIAGNOSIS — I10 Essential (primary) hypertension: Secondary | ICD-10-CM | POA: Diagnosis not present

## 2018-08-19 DIAGNOSIS — H353112 Nonexudative age-related macular degeneration, right eye, intermediate dry stage: Secondary | ICD-10-CM | POA: Diagnosis not present

## 2018-08-19 DIAGNOSIS — H40033 Anatomical narrow angle, bilateral: Secondary | ICD-10-CM

## 2018-08-19 DIAGNOSIS — H353221 Exudative age-related macular degeneration, left eye, with active choroidal neovascularization: Secondary | ICD-10-CM

## 2018-08-19 DIAGNOSIS — H35033 Hypertensive retinopathy, bilateral: Secondary | ICD-10-CM

## 2018-08-19 DIAGNOSIS — H43813 Vitreous degeneration, bilateral: Secondary | ICD-10-CM

## 2018-08-19 DIAGNOSIS — H2513 Age-related nuclear cataract, bilateral: Secondary | ICD-10-CM

## 2018-08-21 LAB — HM DIABETES EYE EXAM

## 2018-08-25 ENCOUNTER — Encounter: Payer: Self-pay | Admitting: Primary Care

## 2018-09-18 ENCOUNTER — Other Ambulatory Visit: Payer: Self-pay

## 2018-09-18 ENCOUNTER — Encounter (INDEPENDENT_AMBULATORY_CARE_PROVIDER_SITE_OTHER): Payer: BC Managed Care – PPO | Admitting: Ophthalmology

## 2018-09-18 DIAGNOSIS — H353221 Exudative age-related macular degeneration, left eye, with active choroidal neovascularization: Secondary | ICD-10-CM | POA: Diagnosis not present

## 2018-09-18 DIAGNOSIS — I10 Essential (primary) hypertension: Secondary | ICD-10-CM | POA: Diagnosis not present

## 2018-09-18 DIAGNOSIS — H35033 Hypertensive retinopathy, bilateral: Secondary | ICD-10-CM | POA: Diagnosis not present

## 2018-09-18 DIAGNOSIS — H353112 Nonexudative age-related macular degeneration, right eye, intermediate dry stage: Secondary | ICD-10-CM

## 2018-09-18 DIAGNOSIS — H2513 Age-related nuclear cataract, bilateral: Secondary | ICD-10-CM

## 2018-09-18 DIAGNOSIS — H43813 Vitreous degeneration, bilateral: Secondary | ICD-10-CM

## 2018-09-23 ENCOUNTER — Other Ambulatory Visit: Payer: Self-pay | Admitting: Primary Care

## 2018-09-23 DIAGNOSIS — E785 Hyperlipidemia, unspecified: Secondary | ICD-10-CM

## 2018-10-02 ENCOUNTER — Other Ambulatory Visit: Payer: Self-pay | Admitting: Primary Care

## 2018-10-16 ENCOUNTER — Encounter (INDEPENDENT_AMBULATORY_CARE_PROVIDER_SITE_OTHER): Payer: BC Managed Care – PPO | Admitting: Ophthalmology

## 2018-10-16 ENCOUNTER — Other Ambulatory Visit: Payer: Self-pay

## 2018-10-16 DIAGNOSIS — H353112 Nonexudative age-related macular degeneration, right eye, intermediate dry stage: Secondary | ICD-10-CM

## 2018-10-16 DIAGNOSIS — H35033 Hypertensive retinopathy, bilateral: Secondary | ICD-10-CM | POA: Diagnosis not present

## 2018-10-16 DIAGNOSIS — H43813 Vitreous degeneration, bilateral: Secondary | ICD-10-CM

## 2018-10-16 DIAGNOSIS — H2513 Age-related nuclear cataract, bilateral: Secondary | ICD-10-CM

## 2018-10-16 DIAGNOSIS — I10 Essential (primary) hypertension: Secondary | ICD-10-CM | POA: Diagnosis not present

## 2018-10-16 DIAGNOSIS — H353221 Exudative age-related macular degeneration, left eye, with active choroidal neovascularization: Secondary | ICD-10-CM

## 2018-11-09 ENCOUNTER — Telehealth: Payer: Self-pay

## 2018-11-09 NOTE — Telephone Encounter (Signed)
Left detailed VM w COVID screen and back door lab info

## 2018-11-11 ENCOUNTER — Other Ambulatory Visit: Payer: BLUE CROSS/BLUE SHIELD

## 2018-11-12 ENCOUNTER — Encounter (INDEPENDENT_AMBULATORY_CARE_PROVIDER_SITE_OTHER): Payer: BC Managed Care – PPO | Admitting: Ophthalmology

## 2018-11-12 ENCOUNTER — Other Ambulatory Visit: Payer: Self-pay

## 2018-11-12 DIAGNOSIS — H35033 Hypertensive retinopathy, bilateral: Secondary | ICD-10-CM | POA: Diagnosis not present

## 2018-11-12 DIAGNOSIS — I10 Essential (primary) hypertension: Secondary | ICD-10-CM | POA: Diagnosis not present

## 2018-11-12 DIAGNOSIS — H353211 Exudative age-related macular degeneration, right eye, with active choroidal neovascularization: Secondary | ICD-10-CM

## 2018-11-12 DIAGNOSIS — H353122 Nonexudative age-related macular degeneration, left eye, intermediate dry stage: Secondary | ICD-10-CM | POA: Diagnosis not present

## 2018-11-12 DIAGNOSIS — H43813 Vitreous degeneration, bilateral: Secondary | ICD-10-CM

## 2018-12-10 ENCOUNTER — Other Ambulatory Visit (INDEPENDENT_AMBULATORY_CARE_PROVIDER_SITE_OTHER): Payer: BC Managed Care – PPO

## 2018-12-10 DIAGNOSIS — K7581 Nonalcoholic steatohepatitis (NASH): Secondary | ICD-10-CM

## 2018-12-10 DIAGNOSIS — R7989 Other specified abnormal findings of blood chemistry: Secondary | ICD-10-CM

## 2018-12-10 DIAGNOSIS — E119 Type 2 diabetes mellitus without complications: Secondary | ICD-10-CM

## 2018-12-10 DIAGNOSIS — E559 Vitamin D deficiency, unspecified: Secondary | ICD-10-CM | POA: Diagnosis not present

## 2018-12-10 LAB — HEPATIC FUNCTION PANEL
ALT: 38 U/L — ABNORMAL HIGH (ref 0–35)
AST: 27 U/L (ref 0–37)
Albumin: 4.5 g/dL (ref 3.5–5.2)
Alkaline Phosphatase: 86 U/L (ref 39–117)
Bilirubin, Direct: 0.1 mg/dL (ref 0.0–0.3)
Total Bilirubin: 0.5 mg/dL (ref 0.2–1.2)
Total Protein: 6.9 g/dL (ref 6.0–8.3)

## 2018-12-10 LAB — VITAMIN D 25 HYDROXY (VIT D DEFICIENCY, FRACTURES): VITD: 29.39 ng/mL — ABNORMAL LOW (ref 30.00–100.00)

## 2018-12-10 LAB — HEMOGLOBIN A1C: Hgb A1c MFr Bld: 8.9 % — ABNORMAL HIGH (ref 4.6–6.5)

## 2018-12-14 DIAGNOSIS — E119 Type 2 diabetes mellitus without complications: Secondary | ICD-10-CM

## 2018-12-15 MED ORDER — JARDIANCE 10 MG PO TABS: 10.0000 mg | ORAL_TABLET | Freq: Every day | ORAL | 1 refills | Status: DC

## 2018-12-17 ENCOUNTER — Encounter (INDEPENDENT_AMBULATORY_CARE_PROVIDER_SITE_OTHER): Payer: BC Managed Care – PPO | Admitting: Ophthalmology

## 2018-12-17 ENCOUNTER — Other Ambulatory Visit: Payer: Self-pay

## 2018-12-17 DIAGNOSIS — I10 Essential (primary) hypertension: Secondary | ICD-10-CM | POA: Diagnosis not present

## 2018-12-17 DIAGNOSIS — H43813 Vitreous degeneration, bilateral: Secondary | ICD-10-CM

## 2018-12-17 DIAGNOSIS — H353112 Nonexudative age-related macular degeneration, right eye, intermediate dry stage: Secondary | ICD-10-CM | POA: Diagnosis not present

## 2018-12-17 DIAGNOSIS — H2513 Age-related nuclear cataract, bilateral: Secondary | ICD-10-CM

## 2018-12-17 DIAGNOSIS — H35033 Hypertensive retinopathy, bilateral: Secondary | ICD-10-CM

## 2018-12-17 DIAGNOSIS — H353221 Exudative age-related macular degeneration, left eye, with active choroidal neovascularization: Secondary | ICD-10-CM

## 2018-12-24 MED ORDER — GLUCOSE BLOOD VI STRP: ORAL_STRIP | 5 refills | Status: DC

## 2018-12-31 LAB — HM DIABETES EYE EXAM

## 2019-01-06 ENCOUNTER — Encounter: Payer: Self-pay | Admitting: Primary Care

## 2019-01-10 ENCOUNTER — Other Ambulatory Visit: Payer: Self-pay | Admitting: Primary Care

## 2019-01-21 ENCOUNTER — Encounter (INDEPENDENT_AMBULATORY_CARE_PROVIDER_SITE_OTHER): Payer: BC Managed Care – PPO | Admitting: Ophthalmology

## 2019-01-21 DIAGNOSIS — H43813 Vitreous degeneration, bilateral: Secondary | ICD-10-CM

## 2019-01-21 DIAGNOSIS — H35033 Hypertensive retinopathy, bilateral: Secondary | ICD-10-CM | POA: Diagnosis not present

## 2019-01-21 DIAGNOSIS — H353112 Nonexudative age-related macular degeneration, right eye, intermediate dry stage: Secondary | ICD-10-CM | POA: Diagnosis not present

## 2019-01-21 DIAGNOSIS — I10 Essential (primary) hypertension: Secondary | ICD-10-CM

## 2019-01-21 DIAGNOSIS — H353221 Exudative age-related macular degeneration, left eye, with active choroidal neovascularization: Secondary | ICD-10-CM

## 2019-01-21 DIAGNOSIS — H2513 Age-related nuclear cataract, bilateral: Secondary | ICD-10-CM

## 2019-02-10 ENCOUNTER — Ambulatory Visit: Payer: BLUE CROSS/BLUE SHIELD | Admitting: Primary Care

## 2019-02-18 ENCOUNTER — Encounter (INDEPENDENT_AMBULATORY_CARE_PROVIDER_SITE_OTHER): Payer: BC Managed Care – PPO | Admitting: Ophthalmology

## 2019-02-18 ENCOUNTER — Other Ambulatory Visit: Payer: Self-pay

## 2019-02-18 DIAGNOSIS — I10 Essential (primary) hypertension: Secondary | ICD-10-CM

## 2019-02-18 DIAGNOSIS — H353221 Exudative age-related macular degeneration, left eye, with active choroidal neovascularization: Secondary | ICD-10-CM | POA: Diagnosis not present

## 2019-02-18 DIAGNOSIS — H35033 Hypertensive retinopathy, bilateral: Secondary | ICD-10-CM

## 2019-02-18 DIAGNOSIS — H353112 Nonexudative age-related macular degeneration, right eye, intermediate dry stage: Secondary | ICD-10-CM | POA: Diagnosis not present

## 2019-02-18 DIAGNOSIS — H2513 Age-related nuclear cataract, bilateral: Secondary | ICD-10-CM

## 2019-02-18 DIAGNOSIS — H43813 Vitreous degeneration, bilateral: Secondary | ICD-10-CM

## 2019-02-25 ENCOUNTER — Other Ambulatory Visit: Payer: Self-pay | Admitting: Primary Care

## 2019-02-27 ENCOUNTER — Other Ambulatory Visit: Payer: Self-pay | Admitting: Primary Care

## 2019-03-02 ENCOUNTER — Other Ambulatory Visit: Payer: Self-pay | Admitting: Primary Care

## 2019-03-02 DIAGNOSIS — E559 Vitamin D deficiency, unspecified: Secondary | ICD-10-CM

## 2019-03-17 ENCOUNTER — Ambulatory Visit: Payer: BC Managed Care – PPO | Admitting: Primary Care

## 2019-03-17 ENCOUNTER — Other Ambulatory Visit: Payer: Self-pay | Admitting: Primary Care

## 2019-03-17 DIAGNOSIS — E785 Hyperlipidemia, unspecified: Secondary | ICD-10-CM

## 2019-03-18 ENCOUNTER — Encounter (INDEPENDENT_AMBULATORY_CARE_PROVIDER_SITE_OTHER): Payer: BC Managed Care – PPO | Admitting: Ophthalmology

## 2019-03-24 ENCOUNTER — Other Ambulatory Visit: Payer: Self-pay | Admitting: Primary Care

## 2019-03-24 ENCOUNTER — Other Ambulatory Visit: Payer: Self-pay

## 2019-03-24 DIAGNOSIS — A6000 Herpesviral infection of urogenital system, unspecified: Secondary | ICD-10-CM

## 2019-03-26 ENCOUNTER — Encounter (INDEPENDENT_AMBULATORY_CARE_PROVIDER_SITE_OTHER): Payer: BC Managed Care – PPO | Admitting: Ophthalmology

## 2019-03-26 DIAGNOSIS — I10 Essential (primary) hypertension: Secondary | ICD-10-CM | POA: Diagnosis not present

## 2019-03-26 DIAGNOSIS — H353221 Exudative age-related macular degeneration, left eye, with active choroidal neovascularization: Secondary | ICD-10-CM

## 2019-03-26 DIAGNOSIS — H353112 Nonexudative age-related macular degeneration, right eye, intermediate dry stage: Secondary | ICD-10-CM | POA: Diagnosis not present

## 2019-03-26 DIAGNOSIS — H35033 Hypertensive retinopathy, bilateral: Secondary | ICD-10-CM | POA: Diagnosis not present

## 2019-03-26 DIAGNOSIS — H43813 Vitreous degeneration, bilateral: Secondary | ICD-10-CM

## 2019-03-26 NOTE — Telephone Encounter (Signed)
Last filled on 05/04/18 #6 tabs with 0 refills, also the note on the refill request says that pharmacy would need a dx code also, f/u scheduled on 04/01/19 please advise

## 2019-03-26 NOTE — Telephone Encounter (Signed)
Refill sent to pharmacy.   

## 2019-04-01 ENCOUNTER — Ambulatory Visit: Payer: BC Managed Care – PPO | Admitting: Primary Care

## 2019-04-01 ENCOUNTER — Other Ambulatory Visit: Payer: Self-pay

## 2019-04-01 VITALS — BP 136/86 | HR 82 | Temp 97.7°F | Ht 64.0 in | Wt 200.3 lb

## 2019-04-01 DIAGNOSIS — E119 Type 2 diabetes mellitus without complications: Secondary | ICD-10-CM

## 2019-04-01 LAB — BASIC METABOLIC PANEL
BUN: 16 mg/dL (ref 6–23)
CO2: 28 mEq/L (ref 19–32)
Calcium: 9.3 mg/dL (ref 8.4–10.5)
Chloride: 104 mEq/L (ref 96–112)
Creatinine, Ser: 0.77 mg/dL (ref 0.40–1.20)
GFR: 76.5 mL/min (ref >60.00)
Glucose, Bld: 170 mg/dL — ABNORMAL HIGH (ref 70–99)
Potassium: 4.1 mEq/L (ref 3.5–5.1)
Sodium: 139 mEq/L (ref 135–145)

## 2019-04-01 LAB — POCT GLYCOSYLATED HEMOGLOBIN (HGB A1C): Hemoglobin A1C: 7.5 % — AB (ref 4.0–5.6)

## 2019-04-01 NOTE — Assessment & Plan Note (Signed)
Improvement in A1C today with reading of 7.5.  Commended her on walking, encouraged to increase exercise. Work on a healthy diet.  Foot exam today. Eye exam UTD. Pneumonia vaccination UTD. Managed on statin and ACE  May/June 2021.

## 2019-04-01 NOTE — Progress Notes (Signed)
Subjective:    Patient ID: Courtney Burns, female    DOB: 1960/08/29, 59 y.o.   MRN: [id number]  HPI  This visit occurred during the SARS-CoV-2 public health emergency.  Safety protocols were in place, including screening questions prior to the visit, additional usage of staff PPE, and extensive cleaning of exam room while observing appropriate contact time as indicated for disinfecting solutions.   Courtney Burns is a 59 year old female with a history of NASH, hypothyroidism, type 2 diabetes, hyperlipidemia who presents today for follow up of diabetes.  Current medications include: Metformin 1000 mg BID, Jardiance 10 mg daily  She is checking her blood glucose 1 times daily and is getting readings of:  AM fasting mid 200's for the last several months, now 120-150's.  Last A1C: 8.9 in October 2020, 7.5 today Last Eye Exam: Completed in October 2020 Last Foot Exam: Due today Pneumonia Vaccination: Completed in 2018 ACE/ARB: Lisinopril  Statin: atorvastatin  BP Readings from Last 3 Encounters:  04/01/19 136/86  08/05/18 (!) 147/81  11/25/17 134/80      Review of Systems  Respiratory: Negative for shortness of breath.   Cardiovascular: Negative for chest pain.  Neurological: Negative for dizziness, numbness and headaches.       Past Medical History:  Diagnosis Date  . Diabetes mellitus without complication ([place])    type 2  . 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 GERD (gastroesophageal reflux disease)    Hx - diet controlled,  no meds  . History of chicken pox   . History of Papanicolaou smear of cervix 08/2013   neg per pt  . HSV infection   . Hypertension   . Hyperthyroidism   . Hypothyroidism    had radiation then thyroid became low  . Ileitis   . Macular degeneration of left eye   . Vitamin D deficiency      Social History   Socioeconomic History  . Marital status: Married    Spouse name: Not on file  . Number of children: 0  . Years of  education: 55  . Highest education level: Not on file  Occupational History  . 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 Smoking status: Never Smoker  . Smokeless tobacco: Never Used  Substance and Sexual Activity  . Alcohol use: No    Alcohol/week: 0.0 standard drinks  . Drug use: No  . Sexual activity: Yes    Partners: Male    Birth control/protection: Post-menopausal  Other Topics Concern  . Not on file  Social History Narrative   Lives in [place].    Works for [employer].   [place] grew up in [place]. She lives in [place] with her husband [name]) and their 3 cat ([name], [place], [place]).    She attended [employer] and obtained her Bachelors in [employer] with a minor in Youth worker.    She loves cooking and sewing.   Social Determinants of Health   Financial Resource Strain:   . Difficulty of Paying Living Expenses: Not on file  Food Insecurity:   . Worried About Charity fundraiser in the Last Year: Not on file  . Ran Out of Food in the Last Year: Not on file  Transportation Needs:   . Lack of Transportation (Medical): Not on file  . Lack of Transportation (Non-Medical): Not on file  Physical Activity:   . Days of Exercise per Week: Not on file  . Minutes of Exercise per Session: Not on file  Stress:   . Feeling of Stress : Not on file  Social Connections:   . Frequency of Communication with Friends and Family: Not on file  . Frequency of Social Gatherings with Friends and Family: Not on file  . Attends Religious Services: Not on file  . Active Member of Clubs or Organizations: Not on file  . Attends Archivist Meetings: Not on file  . Marital Status: Not on file  Intimate Partner Violence:   . Fear of Current or Ex-Partner: Not on file  . Emotionally Abused: Not on file  . Physically Abused: Not on file  . Sexually Abused: Not on file     Past Surgical History:  Procedure Laterality Date  . APPENDECTOMY  1970  . BUNIONECTOMY Right 07/2001   right foot  . CHOLECYSTECTOMY  2005  . COLONOSCOPY  01/2004; 06/2015   Dr. Minette Headland, [place] - polyp; nl  . [place]   left salpingectomy  . RIGHT OOPHORECTOMY Right 03/2003   Abscessed cyst removed  . WISDOM TOOTH EXTRACTION      Family History  Problem Relation Age of Onset  . Hypertension Mother   . Diabetes Mother   . Cancer Mother 53       GBM  . Hyperlipidemia Father   . Heart disease Father        MI  . Hypertension Father   . Diabetes Father   . Depression Father   . COPD Father   . Diabetes Maternal Aunt        Uncontrolled with complications  . Kidney disease Maternal Aunt        Renal failure - dialysis  . Stroke Maternal Grandmother   . Liver cancer Maternal Grandfather   . Cancer Maternal Grandfather 54       lung  . Heart disease Paternal Grandmother        MI  . Diabetes Paternal Grandmother   . Prostate cancer Paternal Grandfather 37  . Cancer - Prostate Paternal Grandfather   . Cancer Maternal Uncle 42       brain tumor  . Colon cancer Neg Hx   . Esophageal cancer Neg Hx   . Rectal cancer Neg Hx   . Stomach cancer Neg Hx     Allergies  Allergen Reactions  . Glipizide Itching  . Sulfa Antibiotics Rash    Current Outpatient Medications on File Prior to Visit  Medication Sig Dispense Refill  . atorvastatin (LIPITOR) 20 MG tablet TAKE 1 TABLET (20 MG TOTAL) BY MOUTH EVERY EVENING. 90 tablet 1  . empagliflozin (JARDIANCE) 10 MG TABS tablet Take 10 mg by mouth daily before breakfast. For diabetes. 90 tablet 1  . glucose blood (BAYER CONTOUR NEXT TEST) test strip Use as instructed to test blood sugar up to 3 times daily 100 each 5  . levothyroxine (SYNTHROID) 75 MCG tablet TAKE 1 TABLET BY MOUTH EVERY DAY IN THE MORNING 90 tablet 1  . lisinopril (ZESTRIL) 20 MG tablet TAKE 1 TABLET BY MOUTH EVERY DAY 90 tablet 1   . metFORMIN (GLUCOPHAGE) 1000 MG tablet TAKE 1 TABLET (1,000 MG TOTAL) BY MOUTH 2 (TWO) TIMES DAILY WITH A MEAL. 180 tablet 0  . PRESCRIPTION MEDICATION Eye injection for wet macular degeneration in her left eye monthly for life, given at [facility] in  [place]    . valACYclovir (VALTREX) 500 MG tablet TAKE 1 TABLET BY MOUTH TWICE A DAY 6 tablet 0  . Vitamin D, Ergocalciferol, (DRISDOL) 1.25 MG (50000 UT) CAPS capsule TAKE 1 CAPSULE BY MOUTH ONCE WEEKLY 12 capsule 1  . brimonidine (ALPHAGAN) 0.2 % ophthalmic solution Place 1 drop into the left eye 2 (two) times daily.     No current facility-administered medications on file prior to visit.    BP 136/86   Pulse 82   Temp 97.7 F (36.5 C) (Temporal)   Ht 5' 4"  (1.626 m)   Wt 200 lb 4.8 oz (90.9 kg)   LMP 07/07/2013   SpO2 97%   BMI 34.38 kg/m    Objective:   Physical Exam  Constitutional: She appears well-nourished.  Cardiovascular: Normal rate and regular rhythm.  Respiratory: Effort normal and breath sounds normal.  Musculoskeletal:     Cervical back: Neck supple.  Skin: Skin is warm and dry.  Psychiatric: She has a normal mood and affect.           Assessment & Plan:

## 2019-04-01 NOTE — Patient Instructions (Addendum)
Stop by the lab prior to leaving today. I will notify you of your results once received.   Continue exercising. You should be getting 150 minutes of moderate intensity exercise weekly.  Continue to work on a healthy diet.  Please schedule a physical with me for late May/early June 2021. You may also schedule a lab only appointment 3-4 days prior. We will discuss your lab results in detail during your physical.  It was a pleasure to see you today!   Diabetes Mellitus and Nutrition, Adult When you have diabetes (diabetes mellitus), it is very important to have healthy eating habits because your blood sugar (glucose) levels are greatly affected by what you eat and drink. Eating healthy foods in the appropriate amounts, at about the same times every day, can help you:  Control your blood glucose.  Lower your risk of heart disease.  Improve your blood pressure.  Reach or maintain a healthy weight. Every person with diabetes is different, and each person has different needs for a meal plan. Your health care provider may recommend that you work with a diet and nutrition specialist (dietitian) to make a meal plan that is best for you. Your meal plan may vary depending on factors such as:  The calories you need.  The medicines you take.  Your weight.  Your blood glucose, blood pressure, and cholesterol levels.  Your activity level.  Other health conditions you have, such as heart or kidney disease. How do carbohydrates affect me? Carbohydrates, also called carbs, affect your blood glucose level more than any other type of food. Eating carbs naturally raises the amount of glucose in your blood. Carb counting is a method for keeping track of how many carbs you eat. Counting carbs is important to keep your blood glucose at a healthy level, especially if you use insulin or take certain oral diabetes medicines. It is important to know how many carbs you can safely have in each meal. This is  different for every person. Your dietitian can help you calculate how many carbs you should have at each meal and for each snack. Foods that contain carbs include:  Bread, cereal, rice, pasta, and crackers.  Potatoes and corn.  Peas, beans, and lentils.  Milk and yogurt.  Fruit and juice.  Desserts, such as cakes, cookies, ice cream, and candy. How does alcohol affect me? Alcohol can cause a sudden decrease in blood glucose (hypoglycemia), especially if you use insulin or take certain oral diabetes medicines. Hypoglycemia can be a life-threatening condition. Symptoms of hypoglycemia (sleepiness, dizziness, and confusion) are similar to symptoms of having too much alcohol. If your health care provider says that alcohol is safe for you, follow these guidelines:  Limit alcohol intake to no more than 1 drink per day for nonpregnant women and 2 drinks per day for men. One drink equals 12 oz of beer, 5 oz of wine, or 1 oz of hard liquor.  Do not drink on an empty stomach.  Keep yourself hydrated with water, diet soda, or unsweetened iced tea.  Keep in mind that regular soda, juice, and other mixers may contain a lot of sugar and must be counted as carbs. What are tips for following this plan?  Reading food labels  Start by checking the serving size on the "Nutrition Facts" label of packaged foods and drinks. The amount of calories, carbs, fats, and other nutrients listed on the label is based on one serving of the item. Many items contain more than  one serving per package.  Check the total grams (g) of carbs in one serving. You can calculate the number of servings of carbs in one serving by dividing the total carbs by 15. For example, if a food has 30 g of total carbs, it would be equal to 2 servings of carbs.  Check the number of grams (g) of saturated and trans fats in one serving. Choose foods that have low or no amount of these fats.  Check the number of milligrams (mg) of salt  (sodium) in one serving. Most people should limit total sodium intake to less than 2,300 mg per day.  Always check the nutrition information of foods labeled as "low-fat" or "nonfat". These foods may be higher in added sugar or refined carbs and should be avoided.  Talk to your dietitian to identify your daily goals for nutrients listed on the label. Shopping  Avoid buying canned, premade, or processed foods. These foods tend to be high in fat, sodium, and added sugar.  Shop around the outside edge of the grocery store. This includes fresh fruits and vegetables, bulk grains, fresh meats, and fresh dairy. Cooking  Use low-heat cooking methods, such as baking, instead of high-heat cooking methods like deep frying.  Cook using healthy oils, such as olive, canola, or sunflower oil.  Avoid cooking with butter, cream, or high-fat meats. Meal planning  Eat meals and snacks regularly, preferably at the same times every day. Avoid going long periods of time without eating.  Eat foods high in fiber, such as fresh fruits, vegetables, beans, and whole grains. Talk to your dietitian about how many servings of carbs you can eat at each meal.  Eat 4-6 ounces (oz) of lean protein each day, such as lean meat, chicken, fish, eggs, or tofu. One oz of lean protein is equal to: ? 1 oz of meat, chicken, or fish. ? 1 egg. ?  cup of tofu.  Eat some foods each day that contain healthy fats, such as avocado, nuts, seeds, and fish. Lifestyle  Check your blood glucose regularly.  Exercise regularly as told by your health care provider. This may include: ? 150 minutes of moderate-intensity or vigorous-intensity exercise each week. This could be brisk walking, biking, or water aerobics. ? Stretching and doing strength exercises, such as yoga or weightlifting, at least 2 times a week.  Take medicines as told by your health care provider.  Do not use any products that contain nicotine or tobacco, such as  cigarettes and e-cigarettes. If you need help quitting, ask your health care provider.  Work with a Social worker or diabetes educator to identify strategies to manage stress and any emotional and social challenges. Questions to ask a health care provider  Do I need to meet with a diabetes educator?  Do I need to meet with a dietitian?  What number can I call if I have questions?  When are the best times to check my blood glucose? Where to find more information:  American Diabetes Association: diabetes.org  Academy of Nutrition and Dietetics: www.eatright.[employer] of Diabetes and Digestive and Kidney Diseases (NIH): [employer] Summary  A healthy meal plan will help you control your blood glucose and maintain a healthy lifestyle.  Working with a diet and nutrition specialist (dietitian) can help you make a meal plan that is best for you.  Keep in mind that carbohydrates (carbs) and alcohol have immediate effects on your blood glucose levels. It is important to count carbs and  to use alcohol carefully. This information is not intended to replace advice given to you by your health care provider. Make sure you discuss any questions you have with your health care provider. Document Revised: 02/07/2017 Document Reviewed: 04/01/2016 Elsevier Patient Education  2020 [employer].

## 2019-04-23 ENCOUNTER — Encounter (INDEPENDENT_AMBULATORY_CARE_PROVIDER_SITE_OTHER): Payer: BC Managed Care – PPO | Admitting: Ophthalmology

## 2019-04-23 ENCOUNTER — Other Ambulatory Visit: Payer: Self-pay

## 2019-04-23 DIAGNOSIS — H43813 Vitreous degeneration, bilateral: Secondary | ICD-10-CM

## 2019-04-23 DIAGNOSIS — H353221 Exudative age-related macular degeneration, left eye, with active choroidal neovascularization: Secondary | ICD-10-CM | POA: Diagnosis not present

## 2019-04-23 DIAGNOSIS — H353112 Nonexudative age-related macular degeneration, right eye, intermediate dry stage: Secondary | ICD-10-CM

## 2019-04-23 DIAGNOSIS — I10 Essential (primary) hypertension: Secondary | ICD-10-CM

## 2019-04-23 DIAGNOSIS — H35033 Hypertensive retinopathy, bilateral: Secondary | ICD-10-CM

## 2019-04-23 DIAGNOSIS — H2513 Age-related nuclear cataract, bilateral: Secondary | ICD-10-CM

## 2019-05-28 ENCOUNTER — Other Ambulatory Visit: Payer: Self-pay

## 2019-05-28 ENCOUNTER — Encounter (INDEPENDENT_AMBULATORY_CARE_PROVIDER_SITE_OTHER): Payer: BC Managed Care – PPO | Admitting: Ophthalmology

## 2019-05-28 DIAGNOSIS — H353112 Nonexudative age-related macular degeneration, right eye, intermediate dry stage: Secondary | ICD-10-CM

## 2019-05-28 DIAGNOSIS — H353221 Exudative age-related macular degeneration, left eye, with active choroidal neovascularization: Secondary | ICD-10-CM

## 2019-05-28 DIAGNOSIS — H35033 Hypertensive retinopathy, bilateral: Secondary | ICD-10-CM | POA: Diagnosis not present

## 2019-05-28 DIAGNOSIS — I10 Essential (primary) hypertension: Secondary | ICD-10-CM

## 2019-05-28 DIAGNOSIS — H2513 Age-related nuclear cataract, bilateral: Secondary | ICD-10-CM

## 2019-05-28 DIAGNOSIS — H43813 Vitreous degeneration, bilateral: Secondary | ICD-10-CM

## 2019-05-29 ENCOUNTER — Other Ambulatory Visit: Payer: Self-pay | Admitting: Primary Care

## 2019-06-15 ENCOUNTER — Other Ambulatory Visit: Payer: Self-pay | Admitting: Primary Care

## 2019-06-15 DIAGNOSIS — E119 Type 2 diabetes mellitus without complications: Secondary | ICD-10-CM

## 2019-06-18 ENCOUNTER — Other Ambulatory Visit: Payer: Self-pay | Admitting: Primary Care

## 2019-07-02 ENCOUNTER — Encounter (INDEPENDENT_AMBULATORY_CARE_PROVIDER_SITE_OTHER): Payer: BC Managed Care – PPO | Admitting: Ophthalmology

## 2019-07-02 DIAGNOSIS — H353221 Exudative age-related macular degeneration, left eye, with active choroidal neovascularization: Secondary | ICD-10-CM

## 2019-07-02 DIAGNOSIS — H35033 Hypertensive retinopathy, bilateral: Secondary | ICD-10-CM | POA: Diagnosis not present

## 2019-07-02 DIAGNOSIS — I10 Essential (primary) hypertension: Secondary | ICD-10-CM | POA: Diagnosis not present

## 2019-07-02 DIAGNOSIS — H353112 Nonexudative age-related macular degeneration, right eye, intermediate dry stage: Secondary | ICD-10-CM | POA: Diagnosis not present

## 2019-07-02 DIAGNOSIS — H43813 Vitreous degeneration, bilateral: Secondary | ICD-10-CM

## 2019-07-19 ENCOUNTER — Other Ambulatory Visit: Payer: Self-pay | Admitting: Primary Care

## 2019-07-21 ENCOUNTER — Other Ambulatory Visit: Payer: Self-pay | Admitting: Primary Care

## 2019-07-21 DIAGNOSIS — E785 Hyperlipidemia, unspecified: Secondary | ICD-10-CM

## 2019-07-21 DIAGNOSIS — E119 Type 2 diabetes mellitus without complications: Secondary | ICD-10-CM

## 2019-07-21 DIAGNOSIS — I10 Essential (primary) hypertension: Secondary | ICD-10-CM

## 2019-07-21 DIAGNOSIS — E559 Vitamin D deficiency, unspecified: Secondary | ICD-10-CM

## 2019-07-21 DIAGNOSIS — E039 Hypothyroidism, unspecified: Secondary | ICD-10-CM

## 2019-07-30 ENCOUNTER — Other Ambulatory Visit: Payer: Self-pay

## 2019-07-30 ENCOUNTER — Encounter (INDEPENDENT_AMBULATORY_CARE_PROVIDER_SITE_OTHER): Payer: 59 | Admitting: Ophthalmology

## 2019-07-30 DIAGNOSIS — I10 Essential (primary) hypertension: Secondary | ICD-10-CM | POA: Diagnosis not present

## 2019-07-30 DIAGNOSIS — H43813 Vitreous degeneration, bilateral: Secondary | ICD-10-CM

## 2019-07-30 DIAGNOSIS — H35033 Hypertensive retinopathy, bilateral: Secondary | ICD-10-CM

## 2019-07-30 DIAGNOSIS — H353221 Exudative age-related macular degeneration, left eye, with active choroidal neovascularization: Secondary | ICD-10-CM | POA: Diagnosis not present

## 2019-07-30 DIAGNOSIS — H353112 Nonexudative age-related macular degeneration, right eye, intermediate dry stage: Secondary | ICD-10-CM

## 2019-08-06 ENCOUNTER — Other Ambulatory Visit (INDEPENDENT_AMBULATORY_CARE_PROVIDER_SITE_OTHER): Payer: 59

## 2019-08-06 ENCOUNTER — Other Ambulatory Visit: Payer: Self-pay | Admitting: Primary Care

## 2019-08-06 DIAGNOSIS — E559 Vitamin D deficiency, unspecified: Secondary | ICD-10-CM

## 2019-08-06 DIAGNOSIS — E785 Hyperlipidemia, unspecified: Secondary | ICD-10-CM | POA: Diagnosis not present

## 2019-08-06 DIAGNOSIS — E039 Hypothyroidism, unspecified: Secondary | ICD-10-CM

## 2019-08-06 DIAGNOSIS — E119 Type 2 diabetes mellitus without complications: Secondary | ICD-10-CM

## 2019-08-06 DIAGNOSIS — I10 Essential (primary) hypertension: Secondary | ICD-10-CM | POA: Diagnosis not present

## 2019-08-06 DIAGNOSIS — Z1231 Encounter for screening mammogram for malignant neoplasm of breast: Secondary | ICD-10-CM

## 2019-08-06 DIAGNOSIS — Z1239 Encounter for other screening for malignant neoplasm of breast: Secondary | ICD-10-CM

## 2019-08-06 LAB — CBC
HCT: 42.3 % (ref 36.0–46.0)
Hemoglobin: 13.9 g/dL (ref 12.0–15.0)
MCHC: 32.9 g/dL (ref 30.0–36.0)
MCV: 88.3 fl (ref 78.0–100.0)
Platelets: 320 10*3/uL (ref 150.0–400.0)
RBC: 4.79 Mil/uL (ref 3.87–5.11)
RDW: 14.3 % (ref 11.5–15.5)
WBC: 7.3 10*3/uL (ref 4.0–10.5)

## 2019-08-06 LAB — COMPREHENSIVE METABOLIC PANEL
ALT: 18 U/L (ref 0–35)
AST: 17 U/L (ref 0–37)
Albumin: 4.4 g/dL (ref 3.5–5.2)
Alkaline Phosphatase: 88 U/L (ref 39–117)
BUN: 13 mg/dL (ref 6–23)
CO2: 28 mEq/L (ref 19–32)
Calcium: 9.6 mg/dL (ref 8.4–10.5)
Chloride: 102 mEq/L (ref 96–112)
Creatinine, Ser: 0.78 mg/dL (ref 0.40–1.20)
GFR: 75.28 mL/min (ref >60.00)
Glucose, Bld: 163 mg/dL — ABNORMAL HIGH (ref 70–99)
Potassium: 4.2 mEq/L (ref 3.5–5.1)
Sodium: 140 mEq/L (ref 135–145)
Total Bilirubin: 0.4 mg/dL (ref 0.2–1.2)
Total Protein: 6.6 g/dL (ref 6.0–8.3)

## 2019-08-06 LAB — TSH: TSH: 2.43 u[IU]/mL (ref 0.35–4.50)

## 2019-08-06 LAB — LIPID PANEL
Cholesterol: 123 mg/dL (ref 0–200)
HDL: 29.4 mg/dL — ABNORMAL LOW (ref >39.00)
NonHDL: 93.35
Total CHOL/HDL Ratio: 4
Triglycerides: 248 mg/dL — ABNORMAL HIGH (ref 0.0–149.0)
VLDL: 49.6 mg/dL — ABNORMAL HIGH (ref 0.0–40.0)

## 2019-08-06 LAB — HEMOGLOBIN A1C: Hgb A1c MFr Bld: 7.5 % — ABNORMAL HIGH (ref 4.6–6.5)

## 2019-08-06 LAB — VITAMIN D 25 HYDROXY (VIT D DEFICIENCY, FRACTURES): VITD: 24.76 ng/mL — ABNORMAL LOW (ref 30.00–100.00)

## 2019-08-06 LAB — LDL CHOLESTEROL, DIRECT: Direct LDL: 59 mg/dL

## 2019-08-10 ENCOUNTER — Encounter: Payer: BC Managed Care – PPO | Admitting: Primary Care

## 2019-08-11 ENCOUNTER — Other Ambulatory Visit: Payer: Self-pay

## 2019-08-11 ENCOUNTER — Encounter: Payer: Self-pay | Admitting: Primary Care

## 2019-08-11 ENCOUNTER — Ambulatory Visit (INDEPENDENT_AMBULATORY_CARE_PROVIDER_SITE_OTHER): Payer: 59 | Admitting: Primary Care

## 2019-08-11 VITALS — BP 118/84 | HR 80 | Temp 96.4°F | Ht 64.0 in | Wt 190.8 lb

## 2019-08-11 DIAGNOSIS — Z0000 Encounter for general adult medical examination without abnormal findings: Secondary | ICD-10-CM

## 2019-08-11 DIAGNOSIS — E039 Hypothyroidism, unspecified: Secondary | ICD-10-CM

## 2019-08-11 DIAGNOSIS — R7989 Other specified abnormal findings of blood chemistry: Secondary | ICD-10-CM

## 2019-08-11 DIAGNOSIS — H3530 Unspecified macular degeneration: Secondary | ICD-10-CM | POA: Diagnosis not present

## 2019-08-11 DIAGNOSIS — I10 Essential (primary) hypertension: Secondary | ICD-10-CM | POA: Diagnosis not present

## 2019-08-11 DIAGNOSIS — A6000 Herpesviral infection of urogenital system, unspecified: Secondary | ICD-10-CM

## 2019-08-11 DIAGNOSIS — E559 Vitamin D deficiency, unspecified: Secondary | ICD-10-CM | POA: Diagnosis not present

## 2019-08-11 DIAGNOSIS — E119 Type 2 diabetes mellitus without complications: Secondary | ICD-10-CM | POA: Diagnosis not present

## 2019-08-11 DIAGNOSIS — E785 Hyperlipidemia, unspecified: Secondary | ICD-10-CM | POA: Diagnosis not present

## 2019-08-11 DIAGNOSIS — R69 Illness, unspecified: Secondary | ICD-10-CM | POA: Diagnosis not present

## 2019-08-11 NOTE — Assessment & Plan Note (Signed)
Recent liver enzymes within normal range. Continue to monitor.

## 2019-08-11 NOTE — Assessment & Plan Note (Signed)
Shingrix due, she will schedule nurse visits as she just had her last Covid-19 vaccine.  Pap smear due after August 2021, defer until next year. Mammogram due, ordered and pending. Colonoscopy UTD, due in 2027.  Encouraged a healthy diet and regular exercise. Exam today benign. Labs reviewed.

## 2019-08-11 NOTE — Assessment & Plan Note (Signed)
Recent vitamin D level of 24 which is too low, despite 50,000 units weekly. Add in 1000 IU nightly. Repeat vitamin D in 3 months.

## 2019-08-11 NOTE — Progress Notes (Signed)
Subjective:    Patient ID: Courtney Burns, female    DOB: 1961-02-21, 59 y.o.   MRN: [id number]  HPI  This visit occurred during the SARS-CoV-2 public health emergency.  Safety protocols were in place, including screening questions prior to the visit, additional usage of staff PPE, and extensive cleaning of exam room while observing appropriate contact time as indicated for disinfecting solutions.   Courtney Burns is a 59 year old female who presents today for complete physical.  Immunizations: -Tetanus: Completed in 2018 -Influenza: Completed last season  -Shingles: Never completed -Pneumonia: Completed in 2018 -Covid-19: Completed series  Diet: Courtney Burns endorses a healthy diet.  Exercise: Courtney Burns is walking 2 miles four days weekly.  Eye exam: UTD Dental exam: Completes semi-annually   Pap Smear: Completed in August 2018 Mammogram: Ordered and pending Colonoscopy: Completed in 2017, due in 2027 Hep C Screen: Negative   BP Readings from Last 3 Encounters:  08/11/19 118/84  04/01/19 136/86  08/05/18 (!) 147/81     Review of Systems  Constitutional: Negative for unexpected weight change.  HENT: Negative for rhinorrhea.   Respiratory: Negative for cough and shortness of breath.   Cardiovascular: Negative for chest pain.  Gastrointestinal: Negative for constipation and diarrhea.  Genitourinary: Negative for difficulty urinating.  Musculoskeletal: Positive for arthralgias.  Skin: Negative for rash.  Allergic/Immunologic: Negative for environmental allergies.  Neurological: Negative for dizziness, numbness and headaches.  Psychiatric/Behavioral:       Increased stress at work, denies concerns       Past Medical History:  Diagnosis Date  . Diabetes mellitus without complication ([place])    type 2  . 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 GERD (gastroesophageal reflux disease)    Hx - diet controlled,  no meds  . History of chicken pox   . History of  Papanicolaou smear of cervix 08/2013   neg per pt  . HSV infection   . Hypertension   . Hyperthyroidism   . Hypothyroidism    had radiation then thyroid became low  . Ileitis   . Macular degeneration of left eye   . Vitamin D deficiency      Social History   Socioeconomic History  . Marital status: Married    Spouse name: Not on file  . Number of children: 0  . Years of education: 23  . Highest education level: Not on file  Occupational History  . 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 Smoking status: Never Smoker  . Smokeless tobacco: Never Used  Substance and Sexual Activity  . Alcohol use: No    Alcohol/week: 0.0 standard drinks  . Drug use: No  . Sexual activity: Yes    Partners: Male    Birth control/protection: Post-menopausal  Other Topics Concern  . Not on file  Social History Narrative   [place] in [place].    Works for [place].   [place]ney [place] grew up in [place] lives in [place] with her husband [place]) and their 3 cat ([place], [place], [place]).    [place]ney [place] attended [place] and obtained her Bachelors in [place] with a minor in [place].    [place]ney [place] loves cooking and sewing.   Social Determinants of Health   Financial Resource Strain:   . Difficulty of Paying Living Expenses:   Food Insecurity:   . Worried About [employer] of  Food in the Last Year:   . [place] in the Last Year:   Transportation Needs:   . Film/video editor (Medical):   [place] Kitchen Lack of Transportation (Non-Medical):   Physical Activity:   . Days of Exercise per Week:   . Minutes of Exercise per Session:   Stress:   . Feeling of Stress :   Social Connections:   . Frequency of Communication with Friends and Family:   . Frequency of Social Gatherings with Friends and Family:   . Attends Religious Services:   . Active Member of Clubs or Organizations:   . Attends English as a second language teacher Meetings:   [place] Kitchen Marital Status:   Intimate Partner Violence:   . Fear of Current or Ex-Partner:   . Emotionally Abused:   [place] Physically Abused:   . Sexually Abused:     Past Surgical History:  Procedure Laterality Date  . APPENDECTOMY  1970  . BUNIONECTOMY Right 07/2001   right foot  . CHOLECYSTECTOMY  2005  . COLONOSCOPY  01/2004; 06/2015   Dr. Minette Headland, [place] - polyp; nl  . [place]   left salpingectomy  . RIGHT OOPHORECTOMY Right 03/2003   Abscessed cyst removed  . WISDOM TOOTH EXTRACTION      Family History  Problem Relation Age of Onset  . Hypertension Mother   . Diabetes Mother   . Cancer Mother 54       GBM  . Hyperlipidemia Father   . Heart disease Father        MI  . Hypertension Father   . Diabetes Father   . Depression Father   . COPD Father   . Diabetes Maternal Aunt        Uncontrolled with complications  . Kidney disease Maternal Aunt        Renal failure - dialysis  . Stroke Maternal Grandmother   . Liver cancer Maternal Grandfather   . Cancer Maternal Grandfather 54       lung  . Heart disease Paternal Grandmother        MI  . Diabetes Paternal Grandmother   . Prostate cancer Paternal Grandfather 22  . Cancer - Prostate Paternal Grandfather   . Cancer Maternal Uncle 42       brain tumor  . Colon cancer Neg Hx   . Esophageal cancer Neg Hx   . Rectal cancer Neg Hx   . Stomach cancer Neg Hx     Allergies  Allergen Reactions  . Glipizide Itching  . Sulfa Antibiotics Rash    Current Outpatient Medications on File Prior to Visit  Medication Sig Dispense Refill  . atorvastatin (LIPITOR) 20 MG tablet TAKE 1 TABLET (20 MG TOTAL) BY MOUTH EVERY EVENING. 90 tablet 1  . brimonidine (ALPHAGAN) 0.2 % ophthalmic solution Place 1 drop into the left eye 2 (two) times daily.    . CONTOUR NEXT TEST test strip USE AS INSTRUCTED TO TEST BLOOD SUGAR UP TO 3 TIMES DAILY 100 strip 5  . JARDIANCE 10 MG TABS tablet TAKE  10 MG BY MOUTH DAILY BEFORE BREAKFAST. FOR DIABETES. 90 tablet 1  . levothyroxine (SYNTHROID) 75 MCG tablet TAKE 1 TABLET BY MOUTH EVERY DAY IN THE MORNING 90 tablet 1  . lisinopril (ZESTRIL) 20 MG tablet TAKE 1 TABLET BY MOUTH EVERY DAY 90 tablet 1  . metFORMIN (GLUCOPHAGE) 1000 MG tablet TAKE 1 TABLET (1,000 MG TOTAL) BY MOUTH 2 (TWO) TIMES DAILY WITH A  MEAL. 180 tablet 1  . PRESCRIPTION MEDICATION Eye injection for wet macular degeneration in her left eye monthly for life, given at [place] [facility]    . valACYclovir (VALTREX) 500 MG tablet TAKE 1 TABLET BY MOUTH TWICE A DAY 6 tablet 0  . Vitamin D, Ergocalciferol, (DRISDOL) 1.25 MG (50000 UT) CAPS capsule TAKE 1 CAPSULE BY MOUTH ONCE WEEKLY 12 capsule 1   No current facility-administered medications on file prior to visit.    BP 118/84   Pulse 80   Temp (!) 96.4 F (35.8 C) (Temporal)   Ht 5' 4"  (1.626 m)   Wt 190 lb 12 oz (86.5 kg)   LMP 07/07/2013   SpO2 98%   BMI 32.74 kg/m    Objective:   Physical Exam  Constitutional: [place] is oriented to person, place, and time. [place] appears well-nourished.  HENT:  Right Ear: Tympanic membrane and ear canal normal.  Left Ear: Tympanic membrane and ear canal normal.  Mouth/Throat: Oropharynx is clear and moist.  Eyes: Pupils are equal, round, and reactive to light. EOM are normal.  Cardiovascular: Normal rate and regular rhythm.  Respiratory: Effort normal and breath sounds normal.  GI: Soft. Bowel sounds are normal. There is no abdominal tenderness.  Musculoskeletal:        General: Normal range of motion.     Cervical back: Neck supple.  Neurological: [place] is alert and oriented to person, place, and time. No cranial nerve deficit.  Reflex Scores:      Patellar reflexes are 2+ on the right side and 2+ on the left side. Skin: Skin is warm and dry.  Psychiatric: [place] has a normal mood and affect.           Assessment & Plan:

## 2019-08-11 NOTE — Assessment & Plan Note (Signed)
Following with ophthalmology and getting injections every 4-6 weeks.

## 2019-08-11 NOTE — Assessment & Plan Note (Signed)
Well controlled in the office today, continue lisinopril. CMP reviewed.

## 2019-08-11 NOTE — Patient Instructions (Addendum)
Continue taking Vitamin D 50,000 units weekly, move this to bedtime.   Start vitamin D 1000 IU once daily at bedtime.  Start exercising. You should be getting 150 minutes of moderate intensity exercise weekly.  It's important to improve your diet by reducing consumption of fast food, fried food, processed snack foods, sugary drinks. Increase consumption of fresh vegetables and fruits, whole grains, water.  Ensure you are drinking 64 ounces of water daily.  Call the breast center to schedule your mammogram.   Schedule nurse visit for the Shingrix vaccine as discussed.   Schedule a lab appointment in 3 months for A1C and vitamin D check.  Please schedule a follow up appointment in 6 months for diabetes check.   It was a pleasure to see you today!   Preventive Care 60-23 Years Old, Female Preventive care refers to visits with your health care provider and lifestyle choices that can promote health and wellness. This includes:  A yearly physical exam. This may also be called an annual well check.  Regular dental visits and eye exams.  Immunizations.  Screening for certain conditions.  Healthy lifestyle choices, such as eating a healthy diet, getting regular exercise, not using drugs or products that contain nicotine and tobacco, and limiting alcohol use. What can I expect for my preventive care visit? Physical exam Your health care provider will check your:  Height and weight. This may be used to calculate body mass index (BMI), which tells if you are at a healthy weight.  Heart rate and blood pressure.  Skin for abnormal spots. Counseling Your health care provider may ask you questions about your:  Alcohol, tobacco, and drug use.  Emotional well-being.  Home and relationship well-being.  Sexual activity.  Eating habits.  Work and work Statistician.  Method of birth control.  Menstrual cycle.  Pregnancy history. What immunizations do I need?  Influenza (flu)  vaccine  This is recommended every year. Tetanus, diphtheria, and pertussis (Tdap) vaccine  You may need a Td booster every 10 years. Varicella (chickenpox) vaccine  You may need this if you have not been vaccinated. Zoster (shingles) vaccine  You may need this after age 60. Measles, mumps, and rubella (MMR) vaccine  You may need at least one dose of MMR if you were born in 1957 or later. You may also need a second dose. Pneumococcal conjugate (PCV13) vaccine  You may need this if you have certain conditions and were not previously vaccinated. Pneumococcal polysaccharide (PPSV23) vaccine  You may need one or two doses if you smoke cigarettes or if you have certain conditions. Meningococcal conjugate (MenACWY) vaccine  You may need this if you have certain conditions. Hepatitis A vaccine  You may need this if you have certain conditions or if you travel or work in places where you may be exposed to hepatitis A. Hepatitis B vaccine  You may need this if you have certain conditions or if you travel or work in places where you may be exposed to hepatitis B. Haemophilus influenzae type b (Hib) vaccine  You may need this if you have certain conditions. Human papillomavirus (HPV) vaccine  If recommended by your health care provider, you may need three doses over 6 months. You may receive vaccines as individual doses or as more than one vaccine together in one shot (combination vaccines). Talk with your health care provider about the risks and benefits of combination vaccines. What tests do I need? Blood tests  Lipid and cholesterol levels. These  may be checked every 5 years, or more frequently if you are over 60 years old.  Hepatitis C test.  Hepatitis B test. Screening  Lung cancer screening. You may have this screening every year starting at age 60 if you have a 30-pack-year history of smoking and currently smoke or have quit within the past 15 years.  Colorectal cancer  screening. All adults should have this screening starting at age 60 and continuing until age 60. Your health care provider may recommend screening at age 60 if you are at increased risk. You will have tests every 1-10 years, depending on your results and the type of screening test.  Diabetes screening. This is done by checking your blood sugar (glucose) after you have not eaten for a while (fasting). You may have this done every 1-3 years.  Mammogram. This may be done every 1-2 years. Talk with your health care provider about when you should start having regular mammograms. This may depend on whether you have a family history of breast cancer.  BRCA-related cancer screening. This may be done if you have a family history of breast, ovarian, tubal, or peritoneal cancers.  Pelvic exam and Pap test. This may be done every 3 years starting at age 60. Starting at age 60, this may be done every 5 years if you have a Pap test in combination with an HPV test. Other tests  Sexually transmitted disease (STD) testing.  Bone density scan. This is done to screen for osteoporosis. You may have this scan if you are at high risk for osteoporosis. Follow these instructions at home: Eating and drinking  Eat a diet that includes fresh fruits and vegetables, whole grains, lean protein, and low-fat dairy.  Take vitamin and mineral supplements as recommended by your health care provider.  Do not drink alcohol if: ? Your health care provider tells you not to drink. ? You are pregnant, may be pregnant, or are planning to become pregnant.  If you drink alcohol: ? Limit how much you have to 0-1 drink a day. ? Be aware of how much alcohol is in your drink. In the [place] one drink equals one 12 oz bottle of beer (355 mL), one 5 oz glass of wine (148 mL), or one 1 oz glass of hard liquor (44 mL). Lifestyle  Take daily care of your teeth and gums.  Stay active. Exercise for at least 30 minutes on 5 or more days  each week.  Do not use any products that contain nicotine or tobacco, such as cigarettes, e-cigarettes, and chewing tobacco. If you need help quitting, ask your health care provider.  If you are sexually active, practice safe sex. Use a condom or other form of birth control (contraception) in order to prevent pregnancy and STIs (sexually transmitted infections).  If told by your health care provider, take low-dose aspirin daily starting at age 85. What's next?  Visit your health care provider once a year for a well check visit.  Ask your health care provider how often you should have your eyes and teeth checked.  Stay up to date on all vaccines. This information is not intended to replace advice given to you by your health care provider. Make sure you discuss any questions you have with your health care provider. Document Revised: 11/06/2017 Document Reviewed: 11/06/2017 Elsevier Patient Education  2020 [employer].

## 2019-08-11 NOTE — Assessment & Plan Note (Signed)
Recent LDL stable, continue atorvastatin.

## 2019-08-11 NOTE — Assessment & Plan Note (Signed)
A1C of 7.5 today which is overall under goal, but would like to see her under 7. AM fasting glucose readings are 150-170.   Patient would like to work on lifestyle changes for another three months to get her A1C below 7. We will repeat A1C in three months.  Continue Jardiance 10 mg and Metformin 1000 mg BID.  Repeat lab in 3 months, follow up in 6 months.

## 2019-08-11 NOTE — Assessment & Plan Note (Signed)
Recent TSH stable.  She is taking vitamin D within four hours of levothyroxine. Discussed to move vitamin D to evening.   Continue levothyroxine 75 mcg.

## 2019-08-11 NOTE — Assessment & Plan Note (Signed)
Infrequent outbreaks, using Valtex PRN. Continue to monitor.

## 2019-08-16 ENCOUNTER — Other Ambulatory Visit: Payer: Self-pay | Admitting: Primary Care

## 2019-08-16 DIAGNOSIS — E559 Vitamin D deficiency, unspecified: Secondary | ICD-10-CM

## 2019-08-16 NOTE — Telephone Encounter (Signed)
Last prescribed on 03/03/2019. Last OV on 08/11/2019 (cpe). Future OV scheduled on 02/10/2020

## 2019-08-17 NOTE — Telephone Encounter (Signed)
Refills sent to pharmacy. 

## 2019-08-18 ENCOUNTER — Other Ambulatory Visit: Payer: Self-pay | Admitting: Primary Care

## 2019-09-01 ENCOUNTER — Other Ambulatory Visit: Payer: Self-pay

## 2019-09-01 ENCOUNTER — Encounter (INDEPENDENT_AMBULATORY_CARE_PROVIDER_SITE_OTHER): Payer: 59 | Admitting: Ophthalmology

## 2019-09-01 DIAGNOSIS — H35033 Hypertensive retinopathy, bilateral: Secondary | ICD-10-CM

## 2019-09-01 DIAGNOSIS — H353221 Exudative age-related macular degeneration, left eye, with active choroidal neovascularization: Secondary | ICD-10-CM

## 2019-09-01 DIAGNOSIS — I10 Essential (primary) hypertension: Secondary | ICD-10-CM

## 2019-09-01 DIAGNOSIS — H43813 Vitreous degeneration, bilateral: Secondary | ICD-10-CM

## 2019-09-01 DIAGNOSIS — H353112 Nonexudative age-related macular degeneration, right eye, intermediate dry stage: Secondary | ICD-10-CM | POA: Diagnosis not present

## 2019-09-10 ENCOUNTER — Other Ambulatory Visit: Payer: Self-pay | Admitting: Primary Care

## 2019-09-10 DIAGNOSIS — E785 Hyperlipidemia, unspecified: Secondary | ICD-10-CM

## 2019-10-08 ENCOUNTER — Encounter (INDEPENDENT_AMBULATORY_CARE_PROVIDER_SITE_OTHER): Payer: 59 | Admitting: Ophthalmology

## 2019-10-08 ENCOUNTER — Other Ambulatory Visit: Payer: Self-pay

## 2019-10-08 DIAGNOSIS — H35033 Hypertensive retinopathy, bilateral: Secondary | ICD-10-CM

## 2019-10-08 DIAGNOSIS — H353221 Exudative age-related macular degeneration, left eye, with active choroidal neovascularization: Secondary | ICD-10-CM | POA: Diagnosis not present

## 2019-10-08 DIAGNOSIS — I10 Essential (primary) hypertension: Secondary | ICD-10-CM | POA: Diagnosis not present

## 2019-10-08 DIAGNOSIS — H43813 Vitreous degeneration, bilateral: Secondary | ICD-10-CM

## 2019-10-08 DIAGNOSIS — H353112 Nonexudative age-related macular degeneration, right eye, intermediate dry stage: Secondary | ICD-10-CM | POA: Diagnosis not present

## 2019-10-08 DIAGNOSIS — H2513 Age-related nuclear cataract, bilateral: Secondary | ICD-10-CM

## 2019-10-31 ENCOUNTER — Other Ambulatory Visit: Payer: Self-pay | Admitting: Primary Care

## 2019-10-31 DIAGNOSIS — E119 Type 2 diabetes mellitus without complications: Secondary | ICD-10-CM

## 2019-11-07 ENCOUNTER — Other Ambulatory Visit: Payer: Self-pay | Admitting: Primary Care

## 2019-11-07 DIAGNOSIS — E559 Vitamin D deficiency, unspecified: Secondary | ICD-10-CM

## 2019-11-07 DIAGNOSIS — E119 Type 2 diabetes mellitus without complications: Secondary | ICD-10-CM

## 2019-11-10 ENCOUNTER — Other Ambulatory Visit: Payer: Self-pay

## 2019-11-10 ENCOUNTER — Encounter (INDEPENDENT_AMBULATORY_CARE_PROVIDER_SITE_OTHER): Payer: 59 | Admitting: Ophthalmology

## 2019-11-10 DIAGNOSIS — I10 Essential (primary) hypertension: Secondary | ICD-10-CM | POA: Diagnosis not present

## 2019-11-10 DIAGNOSIS — H35033 Hypertensive retinopathy, bilateral: Secondary | ICD-10-CM | POA: Diagnosis not present

## 2019-11-10 DIAGNOSIS — H353112 Nonexudative age-related macular degeneration, right eye, intermediate dry stage: Secondary | ICD-10-CM

## 2019-11-10 DIAGNOSIS — H353221 Exudative age-related macular degeneration, left eye, with active choroidal neovascularization: Secondary | ICD-10-CM

## 2019-11-10 DIAGNOSIS — H43813 Vitreous degeneration, bilateral: Secondary | ICD-10-CM

## 2019-11-11 ENCOUNTER — Other Ambulatory Visit (INDEPENDENT_AMBULATORY_CARE_PROVIDER_SITE_OTHER): Payer: 59

## 2019-11-11 ENCOUNTER — Other Ambulatory Visit: Payer: Self-pay

## 2019-11-11 DIAGNOSIS — E119 Type 2 diabetes mellitus without complications: Secondary | ICD-10-CM

## 2019-11-11 DIAGNOSIS — E559 Vitamin D deficiency, unspecified: Secondary | ICD-10-CM

## 2019-11-11 LAB — HEMOGLOBIN A1C: Hgb A1c MFr Bld: 7.2 % — ABNORMAL HIGH (ref 4.6–6.5)

## 2019-11-11 LAB — VITAMIN D 25 HYDROXY (VIT D DEFICIENCY, FRACTURES): VITD: 20.31 ng/mL — ABNORMAL LOW (ref 30.00–100.00)

## 2019-12-03 ENCOUNTER — Ambulatory Visit (INDEPENDENT_AMBULATORY_CARE_PROVIDER_SITE_OTHER)
Admission: RE | Admit: 2019-12-03 | Discharge: 2019-12-03 | Disposition: A | Discharge status: Home / Self Care | Payer: 59 | Source: Ambulatory Visit | Attending: Primary Care | Admitting: Primary Care

## 2019-12-03 ENCOUNTER — Ambulatory Visit: Payer: 59 | Admitting: Primary Care

## 2019-12-03 ENCOUNTER — Other Ambulatory Visit: Payer: Self-pay

## 2019-12-03 VITALS — BP 120/74 | HR 96 | Temp 97.6°F | Ht 64.0 in | Wt 186.0 lb

## 2019-12-03 DIAGNOSIS — G8929 Other chronic pain: Secondary | ICD-10-CM | POA: Diagnosis not present

## 2019-12-03 DIAGNOSIS — M546 Pain in thoracic spine: Secondary | ICD-10-CM

## 2019-12-03 DIAGNOSIS — Z23 Encounter for immunization: Secondary | ICD-10-CM

## 2019-12-03 DIAGNOSIS — M47814 Spondylosis without myelopathy or radiculopathy, thoracic region: Secondary | ICD-10-CM | POA: Diagnosis not present

## 2019-12-03 LAB — POC URINALSYSI DIPSTICK (AUTOMATED)
Bilirubin, UA: NEGATIVE
Blood, UA: NEGATIVE
Glucose, UA: POSITIVE — AB
Ketones, UA: NEGATIVE
Leukocytes, UA: NEGATIVE
Nitrite, UA: NEGATIVE
Protein, UA: NEGATIVE
Spec Grav, UA: >=1.03 — AB (ref 1.010–1.025)
Urobilinogen, UA: 0.2 E.U./dL
pH, UA: 5.5 (ref 5.0–8.0)

## 2019-12-03 MED ORDER — CYCLOBENZAPRINE HCL 5 MG PO TABS: 5.0000 mg | ORAL_TABLET | Freq: Every evening | ORAL | 0 refills | Status: DC | PRN

## 2019-12-03 NOTE — Assessment & Plan Note (Addendum)
Right sided thoracic back pain x3 month,  pain reproducible with movement not associated with any trauma. Suspect musculoskeletal pain.   Will trial low dose short course of muscle relaxer    POC urinalysis completed and without evidence for cystitis or renal stone.  Xray of thoracic spine pending      Encouraged stretching, work on posture when sitting.  Consider PT if needed.   Agree with assessment and plan. Pleas Koch, NP

## 2019-12-03 NOTE — Progress Notes (Signed)
Subjective:    Patient ID: Courtney Burns, female    DOB: [birth year], 59 y.o.   MRN: [id number]  HPI  This visit occurred during the SARS-CoV-2 public health emergency.  Safety protocols were in place, including screening questions prior to the visit, additional usage of staff PPE, and extensive cleaning of exam room while observing appropriate contact time as indicated for disinfecting solutions.   Courtney Burns is a 59 year old female with a history of Crohn's disease, cholecystectomy, type 2 diabetes, NASH, hyperlipidemia who presents today with a chief complaint of back pain.  Her pain is located to the right mid thoracic back with radiation to her right side/flank which began intermittently in July 2021. Over the last two weeks she's noticed more constant and radiating pain. Her pain is more bothersome with sitting at work, also more noticeable when first getting out of bed in the morning.   She denies pain today, diarrhea, constipation, nausea/vomiting, urinary symptoms. She's applied heat with improvement. She can provoke the pain with certain movements. She's caught herself leaning to the left onto the arm rest of her chair, will notice pain with this movement.   Review of Systems  Musculoskeletal: Positive for back pain and myalgias.  Skin: Negative for color change and rash.  Neurological: Negative for numbness.       Past Medical History:  Diagnosis Date  . Diabetes mellitus without complication ([place])    type 2  . 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 GERD (gastroesophageal reflux disease)    Hx - diet controlled,  no meds  . History of chicken pox   . History of Papanicolaou smear of cervix 08/2013   neg per pt  . HSV infection   . Hypertension   . Hyperthyroidism   . Hypothyroidism    had radiation then thyroid became low  . Ileitis   . Macular degeneration of left eye   . Vitamin D deficiency      Social History   Socioeconomic History  .  Marital status: Married    Spouse name: Not on file  . Number of children: 0  . Years of education: 68  . Highest education level: Not on file  Occupational History  . 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 Smoking status: Never Smoker  . Smokeless tobacco: Never Used  Vaping Use  . Vaping Use: Never used  Substance and Sexual Activity  . Alcohol use: No    Alcohol/week: 0.0 standard drinks  . Drug use: No  . Sexual activity: Yes    Partners: Male    Birth control/protection: Post-menopausal  Other Topics Concern  . Not on file  Social History Narrative   Lives in [place].    Works for [employer].   Jenette grew up in [place]. She lives in [place] with her husband [name]) and their 3 cat ([name], Ravenna, Monserrate).    She attended [employer] and obtained her Bachelors in [employer] with a minor in Youth worker.    She loves cooking and sewing.   Social Determinants of Health   Financial Resource Strain:   . Difficulty of Paying Living Expenses: Not on file  Food Insecurity:   . Worried About Charity fundraiser in the Last Year: Not on file  . Ran Out of Food in the Last Year: Not on file  Transportation Needs:   . Film/video editor (Medical): Not on file  . Lack of Transportation (Non-Medical): Not on file  Physical Activity:   . Days of Exercise per Week: Not on file  . Minutes of Exercise per Session: Not on file  Stress:   . Feeling of Stress : Not on file  Social Connections:   . Frequency of Communication with Friends and Family: Not on file  . Frequency of Social Gatherings with Friends and Family: Not on file  . Attends Religious Services: Not on file  . Active Member of Clubs or Organizations: Not on file  . Attends Archivist Meetings: Not on file  . Marital Status: Not on file  Intimate Partner Violence:   . Fear of Current or  Ex-Partner: Not on file  . Emotionally Abused: Not on file  . Physically Abused: Not on file  . Sexually Abused: Not on file    Past Surgical History:  Procedure Laterality Date  . APPENDECTOMY  1970  . BUNIONECTOMY Right 07/2001   right foot  . CHOLECYSTECTOMY  2005  . COLONOSCOPY  01/2004; 06/2015   Dr. Minette Headland, [place] - polyp; nl  . [place]   left salpingectomy  . RIGHT OOPHORECTOMY Right 03/2003   Abscessed cyst removed  . WISDOM TOOTH EXTRACTION      Family History  Problem Relation Age of Onset  . Hypertension Mother   . Diabetes Mother   . Cancer Mother 74       GBM  . Hyperlipidemia Father   . Heart disease Father        MI  . Hypertension Father   . Diabetes Father   . Depression Father   . COPD Father   . Diabetes Maternal Aunt        Uncontrolled with complications  . Kidney disease Maternal Aunt        Renal failure - dialysis  . Stroke Maternal Grandmother   . Liver cancer Maternal Grandfather   . Cancer Maternal Grandfather 54       lung  . Heart disease Paternal Grandmother        MI  . Diabetes Paternal Grandmother   . Prostate cancer Paternal Grandfather 30  . Cancer - Prostate Paternal Grandfather   . Cancer Maternal Uncle 42       brain tumor  . Colon cancer Neg Hx   . Esophageal cancer Neg Hx   . Rectal cancer Neg Hx   . Stomach cancer Neg Hx     Allergies  Allergen Reactions  . Glipizide Itching  . Sulfa Antibiotics Rash    Current Outpatient Medications on File Prior to Visit  Medication Sig Dispense Refill  . atorvastatin (LIPITOR) 20 MG tablet TAKE 1 TABLET (20 MG TOTAL) BY MOUTH EVERY EVENING. 90 tablet 1  . brimonidine (ALPHAGAN) 0.2 % ophthalmic solution Place 1 drop into the left eye 2 (two) times daily.    . CONTOUR NEXT TEST test strip USE AS INSTRUCTED TO TEST BLOOD SUGAR UP TO 3 TIMES DAILY 100 strip 5  . JARDIANCE 10 MG TABS tablet TAKE 10 MG BY MOUTH DAILY BEFORE BREAKFAST. FOR DIABETES. 90  tablet 1  . levothyroxine (SYNTHROID) 75 MCG tablet TAKE 1 TABLET BY MOUTH EVERY DAY IN THE MORNING 90 tablet 1  . lisinopril (ZESTRIL) 20 MG tablet TAKE 1 TABLET BY MOUTH EVERY DAY 90 tablet 1  . metFORMIN (GLUCOPHAGE) 1000 MG tablet TAKE 1  TABLET (1,000 MG TOTAL) BY MOUTH 2 (TWO) TIMES DAILY WITH A MEAL. 180 tablet 1  . 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 valACYclovir (VALTREX) 500 MG tablet TAKE 1 TABLET BY MOUTH TWICE A DAY 6 tablet 0  . Vitamin D, Ergocalciferol, (DRISDOL) 1.25 MG (50000 UNIT) CAPS capsule TAKE 1 CAPSULE BY MOUTH ONCE WEEKLY 12 capsule 0   No current facility-administered medications on file prior to visit.    BP 120/74   Pulse 96   Temp 97.6 F (36.4 C) (Temporal)   Ht 5' 4"  (1.626 m)   Wt 186 lb (84.4 kg)   LMP 07/07/2013   SpO2 96%   BMI 31.93 kg/m    Objective:   Physical Exam Pulmonary:     Effort: Pulmonary effort is normal.  Musculoskeletal:        General: Normal range of motion.     Thoracic back: No tenderness or bony tenderness. Normal range of motion.       Back:  Skin:    General: Skin is warm and dry.     Findings: No rash.  Neurological:     Mental Status: She is alert.            Assessment & Plan:

## 2019-12-03 NOTE — Patient Instructions (Addendum)
Stop by radiology prior to leaving today. I will notify you of your results once received.   You may take Cyclobenzaprine (Flexeril) 67m a muscle relaxer as needed for pain at bedtime.      Influenza (Flu) Vaccine (Inactivated or Recombinant): What You Need to Know 1. Why get vaccinated? Influenza vaccine can prevent influenza (flu). Flu is a contagious disease that spreads around the [place] year, usually between October and May. Anyone can get the flu, but it is more dangerous for some people. Infants and young children, people 670years of age and older, pregnant women, and people with certain health conditions or a weakened immune system are at greatest risk of flu complications. Pneumonia, bronchitis, sinus infections and ear infections are examples of flu-related complications. If you have a medical condition, such as heart disease, cancer or diabetes, flu can make it worse. Flu can cause fever and chills, sore throat, muscle aches, fatigue, cough, headache, and runny or stuffy nose. Some people may have vomiting and diarrhea, though this is more common in children than adults. Each year thousands of people in the [place] die from flu, and many more are hospitalized. Flu vaccine prevents millions of illnesses and flu-related visits to the doctor each year. 2. Influenza vaccine CDC recommends everyone 61months of age and older get vaccinated every flu season. Children 6 months through 871years of age may need 2 doses during a single flu season. Everyone else needs only 1 dose each flu season. It takes about 2 weeks for protection to develop after vaccination. There are many flu viruses, and they are always changing. Each year a new flu vaccine is made to protect against three or four viruses that are likely to cause disease in the upcoming flu season. Even when the vaccine doesn't exactly match these viruses, it may still provide some protection. Influenza vaccine does not  cause flu. Influenza vaccine may be given at the same time as other vaccines. 3. Talk with your health care provider Tell your vaccine provider if the person getting the vaccine:  Has had an allergic reaction after a previous dose of influenza vaccine, or has any severe, life-threatening allergies.  Has ever had Guillain-Barr Syndrome (also called GBS). In some cases, your health care provider may decide to postpone influenza vaccination to a future visit. People with minor illnesses, such as a cold, may be vaccinated. People who are moderately or severely ill should usually wait until they recover before getting influenza vaccine. Your health care provider can give you more information. 4. Risks of a vaccine reaction  Soreness, redness, and swelling where shot is given, fever, muscle aches, and headache can happen after influenza vaccine.  There may be a very small increased risk of Guillain-Barr Syndrome (GBS) after inactivated influenza vaccine (the flu shot). Young children who get the flu shot along with pneumococcal vaccine (PCV13), and/or DTaP vaccine at the same time might be slightly more likely to have a seizure caused by fever. Tell your health care provider if a child who is getting flu vaccine has ever had a seizure. People sometimes faint after medical procedures, including vaccination. Tell your provider if you feel dizzy or have vision changes or ringing in the ears. As with any medicine, there is a very remote chance of a vaccine causing a severe allergic reaction, other serious injury, or death. 5. What if there is a serious problem? An allergic reaction could occur after the vaccinated person leaves the clinic.  If you see signs of a severe allergic reaction (hives, swelling of the face and throat, difficulty breathing, a fast heartbeat, dizziness, or weakness), call 9-1-1 and get the person to the nearest hospital. For other signs that concern you, call your health care  provider. Adverse reactions should be reported to the Vaccine Adverse Event Reporting System (VAERS). Your health care provider will usually file this report, or you can do it yourself. Visit the VAERS website at www.vaers.SamedayNews.es or call 626-484-7387.VAERS is only for reporting reactions, and VAERS staff do not give medical advice. 6. The National Vaccine Injury Compensation Program The [employer] Vaccine Injury Compensation Program (VICP) is a federal program that was created to compensate people who may have been injured by certain vaccines. Visit the VICP website at GoldCloset.com.ee or call (936)055-9018 to learn about the program and about filing a claim. There is a time limit to file a claim for compensation. 7. How can I learn more?  Ask your healthcare provider.  Call your local or state health department.  Contact the Centers for Disease Control and Prevention (CDC): ? Call 819-884-8603 (1-800-CDC-INFO) or ? Visit CDC's https://gibson.com/ Vaccine Information Statement (Interim) Inactivated Influenza Vaccine (10/23/2017) This information is not intended to replace advice given to you by your health care provider. Make sure you discuss any questions you have with your health care provider. Document Revised: 06/16/2018 Document Reviewed: 10/27/2017 Elsevier Patient Education  [employer].

## 2019-12-03 NOTE — Progress Notes (Signed)
Subjective:    Patient ID: Courtney Burns, female    DOB: September 29, 1960, 59 y.o.   MRN: [id number]  HPI   This visit occurred during the SARS-CoV-2 public health emergency.  Safety protocols were in place, including screening questions prior to the visit, additional usage of staff PPE, and extensive cleaning of exam room while observing appropriate contact time as indicated for disinfecting solutions.    [name] is a 59 year-old-female with history of hypertension, hypothyroidism, type 2 diabetes, crohn's disease and obesity presents today with a chief complaint of right sided back pain.   This pain began around the end of July and was intermittent but in the last 2 weeks has been more constant. Pain starts in right thoracic area and radiates into right side. Pain is worse in the morning she she first wakes up. Has been using heating pad with pain relief. Sitting at her desk at work seems to make this pain worse. Has not tried any OTC pain medications.   She denies any nausea, vomiting, diarrhea or constipation. No fevers or chills. No urinary frequency, urgency or dysuria.   BP Readings from Last 3 Encounters:  12/03/19 120/74  08/11/19 118/84  04/01/19 136/86        Review of Systems  Constitutional: Negative.  Negative for chills, fatigue and fever.  Respiratory: Negative.  Negative for shortness of breath.   Cardiovascular: Negative.   Gastrointestinal: Negative.  Negative for abdominal pain, constipation, diarrhea, nausea and vomiting.  Genitourinary: Negative.  Negative for dysuria, hematuria, pelvic pain and urgency.  Musculoskeletal: Positive for back pain.  Skin: Negative.   Neurological: Negative.   Hematological: Negative.           Past Medical History:  Diagnosis Date  . Diabetes mellitus without complication ([place])    type 2  . 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 GERD (gastroesophageal reflux disease)    Hx - diet controlled,  no meds   . History of chicken pox   . History of Papanicolaou smear of cervix 08/2013   neg per pt  . HSV infection   . Hypertension   . Hyperthyroidism   . Hypothyroidism    had radiation then thyroid became low  . Ileitis   . Macular degeneration of left eye   . Vitamin D deficiency      Social History   Socioeconomic History  . Marital status: Married    Spouse name: Not on file  . Number of children: 0  . Years of education: 56  . Highest education level: Not on file  Occupational History  . 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 Smoking status: Never Smoker  . Smokeless tobacco: Never Used  Vaping Use  . Vaping Use: Never used  Substance and Sexual Activity  . Alcohol use: No    Alcohol/week: 0.0 standard drinks  . Drug use: No  . Sexual activity: Yes    Partners: Male    Birth control/protection: Post-menopausal  Other Topics Concern  . Not on file  Social History Narrative   Lives in [place].    Works for [employer].   [name] grew up in [place]. She lives in [place] with her husband [name]) and their 3 cat ([name], [place], [place]).    She attended [employer] and obtained her Bachelors in [employer] with a minor in Psychology and  Special Education.    She loves cooking and sewing.   Social Determinants of Health   Financial Resource Strain:   . Difficulty of Paying Living Expenses: Not on file  Food Insecurity:   . Worried About Charity fundraiser in the Last Year: Not on file  . Ran Out of Food in the Last Year: Not on file  Transportation Needs:   . Lack of Transportation (Medical): Not on file  . Lack of Transportation (Non-Medical): Not on file  Physical Activity:   . Days of Exercise per Week: Not on file  . Minutes of Exercise per Session: Not on file  Stress:   . Feeling of Stress : Not on file  Social Connections:   . Frequency of Communication with Friends  and Family: Not on file  . Frequency of Social Gatherings with Friends and Family: Not on file  . Attends Religious Services: Not on file  . Active Member of Clubs or Organizations: Not on file  . Attends Archivist Meetings: Not on file  . Marital Status: Not on file  Intimate Partner Violence:   . Fear of Current or Ex-Partner: Not on file  . Emotionally Abused: Not on file  . Physically Abused: Not on file  . Sexually Abused: Not on file    Past Surgical History:  Procedure Laterality Date  . APPENDECTOMY  1970  . BUNIONECTOMY Right 07/2001   right foot  . CHOLECYSTECTOMY  2005  . COLONOSCOPY  01/2004; 06/2015   Dr. Minette Headland,  - polyp; nl  . [place]   left salpingectomy  . RIGHT OOPHORECTOMY Right 03/2003   Abscessed cyst removed  . WISDOM TOOTH EXTRACTION      Family History  Problem Relation Age of Onset  . Hypertension Mother   . Diabetes Mother   . Cancer Mother 28       GBM  . Hyperlipidemia Father   . Heart disease Father        MI  . Hypertension Father   . Diabetes Father   . Depression Father   . COPD Father   . Diabetes Maternal Aunt        Uncontrolled with complications  . Kidney disease Maternal Aunt        Renal failure - dialysis  . Stroke Maternal Grandmother   . Liver cancer Maternal Grandfather   . Cancer Maternal Grandfather 54       lung  . Heart disease Paternal Grandmother        MI  . Diabetes Paternal Grandmother   . Prostate cancer Paternal Grandfather 42  . Cancer - Prostate Paternal Grandfather   . Cancer Maternal Uncle 42       brain tumor  . Colon cancer Neg Hx   . Esophageal cancer Neg Hx   . Rectal cancer Neg Hx   . Stomach cancer Neg Hx     Allergies  Allergen Reactions  . Glipizide Itching  . Sulfa Antibiotics Rash    Current Outpatient Medications on File Prior to Visit  Medication Sig Dispense Refill  . atorvastatin (LIPITOR) 20 MG tablet TAKE 1 TABLET (20 MG TOTAL) BY  MOUTH EVERY EVENING. 90 tablet 1  . brimonidine (ALPHAGAN) 0.2 % ophthalmic solution Place 1 drop into the left eye 2 (two) times daily.    . CONTOUR NEXT TEST test strip USE AS INSTRUCTED TO TEST BLOOD SUGAR UP TO 3 TIMES DAILY 100 strip 5  .  JARDIANCE 10 MG TABS tablet TAKE 10 MG BY MOUTH DAILY BEFORE BREAKFAST. FOR DIABETES. 90 tablet 1  . levothyroxine (SYNTHROID) 75 MCG tablet TAKE 1 TABLET BY MOUTH EVERY DAY IN THE MORNING 90 tablet 1  . lisinopril (ZESTRIL) 20 MG tablet TAKE 1 TABLET BY MOUTH EVERY DAY 90 tablet 1  . metFORMIN (GLUCOPHAGE) 1000 MG tablet TAKE 1 TABLET (1,000 MG TOTAL) BY MOUTH 2 (TWO) TIMES DAILY WITH A MEAL. 180 tablet 1  . PRESCRIPTION MEDICATION Eye injection for wet macular degeneration in her left eye monthly for life, given at [place] in [facility]    . valACYclovir (VALTREX) 500 MG tablet TAKE 1 TABLET BY MOUTH TWICE A DAY 6 tablet 0  . Vitamin D, Ergocalciferol, (DRISDOL) 1.25 MG (50000 UNIT) CAPS capsule TAKE 1 CAPSULE BY MOUTH ONCE WEEKLY 12 capsule 0   No current facility-administered medications on file prior to visit.    BP 120/74   Pulse 96   Temp 97.6 F (36.4 C) (Temporal)   Ht 5' 4"  (1.626 m)   Wt 186 lb (84.4 kg)   LMP 07/07/2013   SpO2 96%   BMI 31.93 kg/m    Objective:   Physical Exam Constitutional:      Appearance: Normal appearance.  Cardiovascular:     Rate and Rhythm: Normal rate and regular rhythm.     Pulses: Normal pulses.  Pulmonary:     Effort: Pulmonary effort is normal.  Abdominal:     General: There is no distension.     Palpations: Abdomen is soft.     Tenderness: There is no abdominal tenderness. There is no right CVA tenderness or left CVA tenderness.  Musculoskeletal:        General: No deformity or signs of injury.  Skin:    Capillary Refill: Capillary refill takes less than 2 seconds.  Neurological:     General: No focal deficit present.     Mental Status: She is alert and oriented to person,  place, and time.           Assessment & Plan:

## 2019-12-09 ENCOUNTER — Other Ambulatory Visit: Payer: Self-pay | Admitting: Primary Care

## 2019-12-09 DIAGNOSIS — E119 Type 2 diabetes mellitus without complications: Secondary | ICD-10-CM

## 2019-12-22 ENCOUNTER — Encounter (INDEPENDENT_AMBULATORY_CARE_PROVIDER_SITE_OTHER): Payer: 59 | Admitting: Ophthalmology

## 2019-12-22 ENCOUNTER — Other Ambulatory Visit: Payer: Self-pay

## 2019-12-22 DIAGNOSIS — H353221 Exudative age-related macular degeneration, left eye, with active choroidal neovascularization: Secondary | ICD-10-CM | POA: Diagnosis not present

## 2019-12-22 DIAGNOSIS — H35033 Hypertensive retinopathy, bilateral: Secondary | ICD-10-CM

## 2019-12-22 DIAGNOSIS — H353112 Nonexudative age-related macular degeneration, right eye, intermediate dry stage: Secondary | ICD-10-CM

## 2019-12-22 DIAGNOSIS — I10 Essential (primary) hypertension: Secondary | ICD-10-CM

## 2019-12-22 DIAGNOSIS — H43813 Vitreous degeneration, bilateral: Secondary | ICD-10-CM

## 2020-01-14 ENCOUNTER — Other Ambulatory Visit: Payer: Self-pay | Admitting: Primary Care

## 2020-01-26 ENCOUNTER — Other Ambulatory Visit: Payer: Self-pay | Admitting: Primary Care

## 2020-01-26 DIAGNOSIS — E559 Vitamin D deficiency, unspecified: Secondary | ICD-10-CM

## 2020-01-26 NOTE — Telephone Encounter (Signed)
Did you want to continue or just take otc meds

## 2020-01-27 NOTE — Telephone Encounter (Signed)
Yes, we will continue for now and discuss at upcoming visit.

## 2020-02-02 ENCOUNTER — Other Ambulatory Visit: Payer: Self-pay

## 2020-02-02 ENCOUNTER — Encounter (INDEPENDENT_AMBULATORY_CARE_PROVIDER_SITE_OTHER): Payer: 59 | Admitting: Ophthalmology

## 2020-02-02 DIAGNOSIS — H43813 Vitreous degeneration, bilateral: Secondary | ICD-10-CM

## 2020-02-02 DIAGNOSIS — H353221 Exudative age-related macular degeneration, left eye, with active choroidal neovascularization: Secondary | ICD-10-CM

## 2020-02-02 DIAGNOSIS — H353112 Nonexudative age-related macular degeneration, right eye, intermediate dry stage: Secondary | ICD-10-CM | POA: Diagnosis not present

## 2020-02-02 DIAGNOSIS — H35033 Hypertensive retinopathy, bilateral: Secondary | ICD-10-CM

## 2020-02-02 DIAGNOSIS — I10 Essential (primary) hypertension: Secondary | ICD-10-CM | POA: Diagnosis not present

## 2020-02-10 ENCOUNTER — Other Ambulatory Visit: Payer: Self-pay | Admitting: Primary Care

## 2020-02-10 ENCOUNTER — Other Ambulatory Visit: Payer: Self-pay

## 2020-02-10 ENCOUNTER — Ambulatory Visit: Payer: 59 | Admitting: Primary Care

## 2020-02-10 VITALS — BP 123/86 | HR 93 | Temp 98.3°F | Ht 64.0 in | Wt 187.6 lb

## 2020-02-10 DIAGNOSIS — E039 Hypothyroidism, unspecified: Secondary | ICD-10-CM

## 2020-02-10 DIAGNOSIS — E559 Vitamin D deficiency, unspecified: Secondary | ICD-10-CM | POA: Diagnosis not present

## 2020-02-10 DIAGNOSIS — E785 Hyperlipidemia, unspecified: Secondary | ICD-10-CM

## 2020-02-10 DIAGNOSIS — E119 Type 2 diabetes mellitus without complications: Secondary | ICD-10-CM

## 2020-02-10 LAB — POCT GLYCOSYLATED HEMOGLOBIN (HGB A1C): Hemoglobin A1C: 6.9 % — AB (ref 4.0–5.6)

## 2020-02-10 NOTE — Assessment & Plan Note (Signed)
Improvement in A1C to 6.9 from 7.2 three months ago. Commended her on regular exercise and dietary improvements!  Continue Jardiance 10 mg daily and Metformin 1000 mg BID.   Foot and eye exam is UTD. Managed on statin and ACE-I. Pneumonia vaccination UTD.  Follow up in 6 months.

## 2020-02-10 NOTE — Assessment & Plan Note (Signed)
She has noticed myalgias at night to her legs.  Hold atorvastatin 20 mg for 2 weeks. She will update.

## 2020-02-10 NOTE — Patient Instructions (Addendum)
Continue exercising. You should be getting 150 minutes of moderate intensity exercise weekly.  Continue to work on [employer]. Ensure you are consuming 64 ounces of water daily.  Schedule a lab appointment for 6 weeks to repeat your vitamin D and thyroid function.  We will see you in 6 months for your annual physical.   It was a pleasure to see you today!           Health Maintenance Due  Topic Date Due  . MAMMOGRAM  11/06/2018  . PAP SMEAR-Modifier  11/05/2019  . OPHTHALMOLOGY EXAM  12/31/2019    Depression screen [facility] 2/9 12/03/2019 05/16/2017  Decreased Interest 0 0  Down, Depressed, Hopeless 0 0  PHQ - 2 Score 0 0  Altered sleeping 0 -  Tired, decreased energy 0 -  Change in appetite 0 -  Feeling bad or failure about yourself  0 -  Trouble concentrating 0 -  Moving slowly or fidgety/restless 0 -  Suicidal thoughts 0 -  PHQ-9 Score 0 -  Difficult doing work/chores Not difficult at all -    Recommended follow up: No follow-ups on file.

## 2020-02-10 NOTE — Assessment & Plan Note (Signed)
Compliant to 50000 IU weekly, recent vitamin D level too low. She is taking 1000 IU but not routinely.  Discussed to continue 50,000 IU weekly, resume 1000 IU daily. Repeat vitamin D level in 6 weeks.

## 2020-02-10 NOTE — Progress Notes (Signed)
Subjective:    Patient ID: Courtney Burns, female    DOB: [birth year], 59 y.o.   MRN: [id number]  HPI  This visit occurred during the SARS-CoV-2 public health emergency.  Safety protocols were in place, including screening questions prior to the visit, additional usage of staff PPE, and extensive cleaning of exam room while observing appropriate contact time as indicated for disinfecting solutions.   Courtney Burns is a 59 year old female with a history of hypertension, NASH, type 2 diabetes, hyperlipidemia, hypothyroidism who presents today for follow up of diabetes.  Current medications include: Jardiance 10 mg, Metformin 1000 mg BID  She is checking her blood glucose 1 times daily and is getting readings of:  AM fasting: 140's  Last A1C: 7.2 in September 2021, 6.9 today Last Eye Exam: Due Last Foot Exam: UTD Pneumonia Vaccination: Completed in 2018 ACE/ARB: Lisinopril  Statin: Lipitor   BP Readings from Last 3 Encounters:  02/10/20 123/86  12/03/19 120/74  08/11/19 118/84    Wt Readings from Last 3 Encounters:  02/10/20 187 lb 9.6 oz (85.1 kg)  12/03/19 186 lb (84.4 kg)  08/11/19 190 lb 12 oz (86.5 kg)   She is really working on her diet, reduced intake of fried/fatty foods. She has also been walking every morning with her neighbor.      Review of Systems  Eyes: Negative for visual disturbance.  Respiratory: Negative for shortness of breath.   Cardiovascular: Negative for chest pain.  Neurological: Negative for dizziness, numbness and headaches.       Past Medical History:  Diagnosis Date  . Diabetes mellitus without complication ([place])    type 2  . 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 GERD (gastroesophageal reflux disease)    Hx - diet controlled,  no meds  . History of chicken pox   . History of Papanicolaou smear of cervix 08/2013   neg per pt  . HSV infection   . Hypertension   . Hyperthyroidism   . Hypothyroidism    had radiation then  thyroid became low  . Ileitis   . Macular degeneration of left eye   . Vitamin D deficiency      Social History   Socioeconomic History  . Marital status: Married    Spouse name: Not on file  . Number of children: 0  . Years of education: 83  . Highest education level: Not on file  Occupational History  . 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 Smoking status: Never Smoker  . Smokeless tobacco: Never Used  Vaping Use  . Vaping Use: Never used  Substance and Sexual Activity  . Alcohol use: No    Alcohol/week: 0.0 standard drinks  . Drug use: No  . Sexual activity: Yes    Partners: Male    Birth control/protection: Post-menopausal  Other Topics Concern  . Not on file  Social History Narrative   Lives in [place].    Works for [employer].   [name] grew up in [place]. She lives in [place] with her husband [name]) and their 3 cat ([name], [name], [name]).    She attended [employer] and obtained her Bachelors in [employer] with a minor in Youth worker.    She loves cooking and sewing.   Social Determinants of Health   Financial Resource Strain:   . Difficulty of Paying Living  Expenses: Not on file  Food Insecurity:   . Worried About Charity fundraiser in the Last Year: Not on file  . Ran Out of Food in the Last Year: Not on file  Transportation Needs:   . Lack of Transportation (Medical): Not on file  . Lack of Transportation (Non-Medical): Not on file  Physical Activity:   . Days of Exercise per Week: Not on file  . Minutes of Exercise per Session: Not on file  Stress:   . Feeling of Stress : Not on file  Social Connections:   . Frequency of Communication with Friends and Family: Not on file  . Frequency of Social Gatherings with Friends and Family: Not on file  . Attends Religious Services: Not on file  . Active Member of Clubs or Organizations: Not on  file  . Attends Archivist Meetings: Not on file  . Marital Status: Not on file  Intimate Partner Violence:   . Fear of Current or Ex-Partner: Not on file  . Emotionally Abused: Not on file  . Physically Abused: Not on file  . Sexually Abused: Not on file    Past Surgical History:  Procedure Laterality Date  . APPENDECTOMY  1970  . BUNIONECTOMY Right 07/2001   right foot  . CHOLECYSTECTOMY  2005  . COLONOSCOPY  01/2004; 06/2015   Dr. Minette Headland, [place] - polyp; nl  . [place]   left salpingectomy  . RIGHT OOPHORECTOMY Right 03/2003   Abscessed cyst removed  . WISDOM TOOTH EXTRACTION      Family History  Problem Relation Age of Onset  . Hypertension Mother   . Diabetes Mother   . Cancer Mother 26       GBM  . Hyperlipidemia Father   . Heart disease Father        MI  . Hypertension Father   . Diabetes Father   . Depression Father   . COPD Father   . Diabetes Maternal Aunt        Uncontrolled with complications  . Kidney disease Maternal Aunt        Renal failure - dialysis  . Stroke Maternal Grandmother   . Liver cancer Maternal Grandfather   . Cancer Maternal Grandfather 54       lung  . Heart disease Paternal Grandmother        MI  . Diabetes Paternal Grandmother   . Prostate cancer Paternal Grandfather 29  . Cancer - Prostate Paternal Grandfather   . Cancer Maternal Uncle 42       brain tumor  . Colon cancer Neg Hx   . Esophageal cancer Neg Hx   . Rectal cancer Neg Hx   . Stomach cancer Neg Hx     Allergies  Allergen Reactions  . Glipizide Itching  . Sulfa Antibiotics Rash    Current Outpatient Medications on File Prior to Visit  Medication Sig Dispense Refill  . atorvastatin (LIPITOR) 20 MG tablet TAKE 1 TABLET (20 MG TOTAL) BY MOUTH EVERY EVENING. 90 tablet 1  . brimonidine (ALPHAGAN) 0.2 % ophthalmic solution Place 1 drop into the left eye 2 (two) times daily.    . CONTOUR NEXT TEST test strip USE AS INSTRUCTED  TO TEST BLOOD SUGAR UP TO 3 TIMES DAILY 100 strip 5  . cyclobenzaprine (FLEXERIL) 5 MG tablet Take 1 tablet (5 mg total) by mouth at bedtime as needed for muscle spasms. 15 tablet 0  . JARDIANCE 10  MG TABS tablet TAKE 1 TABLET BY MOUTH DAILY BEFORE BREAKFAST *FOR DIABETES* 90 tablet 1  . levothyroxine (SYNTHROID) 75 MCG tablet TAKE 1 TABLET BY MOUTH EVERY DAY IN THE MORNING 90 tablet 1  . lisinopril (ZESTRIL) 20 MG tablet TAKE 1 TABLET BY MOUTH EVERY DAY 90 tablet 1  . metFORMIN (GLUCOPHAGE) 1000 MG tablet TAKE 1 TABLET (1,000 MG TOTAL) BY MOUTH 2 (TWO) TIMES DAILY WITH A MEAL. 180 tablet 1  . PRESCRIPTION MEDICATION Eye injection for wet macular degeneration in her left eye monthly for life, given at [place] in [facility]    . valACYclovir (VALTREX) 500 MG tablet TAKE 1 TABLET BY MOUTH TWICE A DAY 6 tablet 0  . Vitamin D, Ergocalciferol, (DRISDOL) 1.25 MG (50000 UNIT) CAPS capsule TAKE 1 CAPSULE BY MOUTH ONCE WEEKLY 12 capsule 0   No current facility-administered medications on file prior to visit.    BP 123/86 (BP Location: Right Arm, Patient Position: Sitting, Cuff Size: Normal)   Pulse 93   Temp 98.3 F (36.8 C) (Oral)   Ht 5' 4"  (1.626 m)   Wt 187 lb 9.6 oz (85.1 kg)   LMP 07/07/2013   SpO2 97%   BMI 32.20 kg/m    Objective:   Physical Exam Cardiovascular:     Rate and Rhythm: Normal rate and regular rhythm.  Pulmonary:     Effort: Pulmonary effort is normal.     Breath sounds: Normal breath sounds.  Musculoskeletal:     Cervical back: Neck supple.  Skin:    General: Skin is warm and dry.            Assessment & Plan:

## 2020-03-13 DIAGNOSIS — U071 COVID-19: Secondary | ICD-10-CM | POA: Diagnosis not present

## 2020-03-13 DIAGNOSIS — Z03818 Encounter for observation for suspected exposure to other biological agents ruled out: Secondary | ICD-10-CM | POA: Diagnosis not present

## 2020-03-13 DIAGNOSIS — Z20822 Contact with and (suspected) exposure to covid-19: Secondary | ICD-10-CM | POA: Diagnosis not present

## 2020-03-15 ENCOUNTER — Encounter (INDEPENDENT_AMBULATORY_CARE_PROVIDER_SITE_OTHER): Payer: 59 | Admitting: Ophthalmology

## 2020-03-22 ENCOUNTER — Encounter (INDEPENDENT_AMBULATORY_CARE_PROVIDER_SITE_OTHER): Payer: 59 | Admitting: Ophthalmology

## 2020-03-23 ENCOUNTER — Other Ambulatory Visit: Payer: 59

## 2020-03-29 ENCOUNTER — Other Ambulatory Visit: Payer: Self-pay | Admitting: Primary Care

## 2020-04-05 ENCOUNTER — Encounter (INDEPENDENT_AMBULATORY_CARE_PROVIDER_SITE_OTHER): Payer: 59 | Admitting: Ophthalmology

## 2020-04-05 ENCOUNTER — Other Ambulatory Visit: Payer: Self-pay

## 2020-04-05 DIAGNOSIS — H353112 Nonexudative age-related macular degeneration, right eye, intermediate dry stage: Secondary | ICD-10-CM | POA: Diagnosis not present

## 2020-04-05 DIAGNOSIS — H35033 Hypertensive retinopathy, bilateral: Secondary | ICD-10-CM | POA: Diagnosis not present

## 2020-04-05 DIAGNOSIS — I10 Essential (primary) hypertension: Secondary | ICD-10-CM | POA: Diagnosis not present

## 2020-04-05 DIAGNOSIS — H353221 Exudative age-related macular degeneration, left eye, with active choroidal neovascularization: Secondary | ICD-10-CM | POA: Diagnosis not present

## 2020-04-05 DIAGNOSIS — H43813 Vitreous degeneration, bilateral: Secondary | ICD-10-CM

## 2020-05-12 ENCOUNTER — Other Ambulatory Visit: Payer: Self-pay

## 2020-05-12 ENCOUNTER — Encounter (INDEPENDENT_AMBULATORY_CARE_PROVIDER_SITE_OTHER): Payer: 59 | Admitting: Ophthalmology

## 2020-05-12 DIAGNOSIS — H35033 Hypertensive retinopathy, bilateral: Secondary | ICD-10-CM

## 2020-05-12 DIAGNOSIS — H353221 Exudative age-related macular degeneration, left eye, with active choroidal neovascularization: Secondary | ICD-10-CM

## 2020-05-12 DIAGNOSIS — H353112 Nonexudative age-related macular degeneration, right eye, intermediate dry stage: Secondary | ICD-10-CM | POA: Diagnosis not present

## 2020-05-12 DIAGNOSIS — I10 Essential (primary) hypertension: Secondary | ICD-10-CM | POA: Diagnosis not present

## 2020-05-12 DIAGNOSIS — H43813 Vitreous degeneration, bilateral: Secondary | ICD-10-CM

## 2020-05-12 DIAGNOSIS — H2513 Age-related nuclear cataract, bilateral: Secondary | ICD-10-CM

## 2020-05-24 NOTE — Telephone Encounter (Signed)
Rena, please schedule her for an office visit, this really needs to be seen and not treated on My Chart.  I'll add her in tomorrow at 3 pm.

## 2020-05-24 NOTE — Telephone Encounter (Signed)
Pt notified as instructed and voiced understanding and scheduled in office appt with Gentry Fitz NP on 05/25/20 at 3 PM. No covid symptoms or known exposure.

## 2020-05-24 NOTE — Telephone Encounter (Signed)
Pt left v/m wanting to verify that Allie Bossier NP received the mychart message pt sent on 05/23/20 and pt is requesting response.sending note to Gentry Fitz NP and Joellen CMA.

## 2020-05-25 ENCOUNTER — Ambulatory Visit: Payer: 59 | Admitting: Primary Care

## 2020-05-25 ENCOUNTER — Other Ambulatory Visit (HOSPITAL_COMMUNITY)
Admission: RE | Admit: 2020-05-25 | Discharge: 2020-05-25 | Disposition: A | Discharge status: Home / Self Care | Payer: 59 | Source: Ambulatory Visit | Attending: Primary Care | Admitting: Primary Care

## 2020-05-25 ENCOUNTER — Other Ambulatory Visit: Payer: Self-pay

## 2020-05-25 VITALS — BP 120/62 | HR 68 | Temp 98.4°F | Ht 64.0 in | Wt 192.0 lb

## 2020-05-25 DIAGNOSIS — N898 Other specified noninflammatory disorders of vagina: Secondary | ICD-10-CM | POA: Insufficient documentation

## 2020-05-25 DIAGNOSIS — Z124 Encounter for screening for malignant neoplasm of cervix: Secondary | ICD-10-CM

## 2020-05-25 LAB — POC URINALSYSI DIPSTICK (AUTOMATED)
Bilirubin, UA: NEGATIVE
Blood, UA: NEGATIVE
Glucose, UA: POSITIVE — AB
Ketones, UA: NEGATIVE
Leukocytes, UA: NEGATIVE
Nitrite, UA: NEGATIVE
Protein, UA: NEGATIVE
Spec Grav, UA: 1.015 (ref 1.010–1.025)
Urobilinogen, UA: 0.2 E.U./dL
pH, UA: 5 (ref 5.0–8.0)

## 2020-05-25 NOTE — Progress Notes (Signed)
Subjective:    Patient ID: Courtney Burns, female    DOB: 04-14-1960, 60 y.o.   MRN: [id number]  HPI  Courtney Burns is a very pleasant 60 y.o. female with a history of type 2 diabetes, hypothyroidism, hypertension, genital herpes who presents today with a chief complaint of vaginal itching. She is also due for her pap smear.   She's also noticed external vaginal burning, burning to the vaginal skin when urinating. Symptoms began about one week ago.   Currently managed on Jardiance 10 mg for diabetes for which she's been taking over the last year. Last week she indulged in more sweets for her birthday, is thinking she may have a vaginal yeast infection.  She's been soaking in a baking soda bath with temporary improvement. She denies vaginal discharge, urinary frequency, hematuria.    Review of Systems  Gastrointestinal: Negative for abdominal pain.  Genitourinary: Positive for dysuria. Negative for frequency and vaginal discharge.       Vaginal itching         Past Medical History:  Diagnosis Date  . Diabetes mellitus without complication ([place])    type 2  . 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 GERD (gastroesophageal reflux disease)    Hx - diet controlled,  no meds  . History of chicken pox   . History of Papanicolaou smear of cervix 08/2013   neg per pt  . HSV infection   . Hypertension   . Hyperthyroidism   . Hypothyroidism    had radiation then thyroid became low  . Ileitis   . Macular degeneration of left eye   . Vitamin D deficiency     Social History   Socioeconomic History  . Marital status: Married    Spouse name: Not on file  . Number of children: 0  . Years of education: 28  . Highest education level: Not on file  Occupational History  . 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 Smoking status: Never Smoker  . Smokeless tobacco: Never Used  Vaping Use   . Vaping Use: Never used  Substance and Sexual Activity  . Alcohol use: No    Alcohol/week: 0.0 standard drinks  . Drug use: No  . Sexual activity: Yes    Partners: Male    Birth control/protection: Post-menopausal  Other Topics Concern  . Not on file  Social History Narrative   Lives in [place].    Works for [employer].   [name] grew up in [place]. She lives in [place] with her husband [name]) and their 3 cat ([name], [place], [place]).    She attended [employer] and obtained her Bachelors in [employer] with a minor in Youth worker.    She loves cooking and sewing.   Social Determinants of Health   Financial Resource Strain: Not on file  Food Insecurity: Not on file  Transportation Needs: Not on file  Physical Activity: Not on file  Stress: Not on file  Social Connections: Not on file  Intimate Partner Violence: Not on file    Past Surgical History:  Procedure Laterality Date  . APPENDECTOMY  1970  . BUNIONECTOMY Right 07/2001   right foot  . CHOLECYSTECTOMY  2005  . COLONOSCOPY  01/2004; 06/2015   Dr. Minette Headland, [place] - polyp; nl  . [place]   left salpingectomy  .  RIGHT OOPHORECTOMY Right 03/2003   Abscessed cyst removed  . WISDOM TOOTH EXTRACTION      Family History  Problem Relation Age of Onset  . Hypertension Mother   . Diabetes Mother   . Cancer Mother 48       GBM  . Hyperlipidemia Father   . Heart disease Father        MI  . Hypertension Father   . Diabetes Father   . Depression Father   . COPD Father   . Diabetes Maternal Aunt        Uncontrolled with complications  . Kidney disease Maternal Aunt        Renal failure - dialysis  . Stroke Maternal Grandmother   . Liver cancer Maternal Grandfather   . Cancer Maternal Grandfather 54       lung  . Heart disease Paternal Grandmother        MI  . Diabetes Paternal Grandmother   . Prostate cancer Paternal Grandfather 52  . Cancer -  Prostate Paternal Grandfather   . Cancer Maternal Uncle 42       brain tumor  . Colon cancer Neg Hx   . Esophageal cancer Neg Hx   . Rectal cancer Neg Hx   . Stomach cancer Neg Hx     Allergies  Allergen Reactions  . Glipizide Itching  . Sulfa Antibiotics Rash    Current Outpatient Medications on File Prior to Visit  Medication Sig Dispense Refill  . atorvastatin (LIPITOR) 20 MG tablet TAKE 1 TABLET (20 MG TOTAL) BY MOUTH EVERY EVENING. 90 tablet 1  . brimonidine (ALPHAGAN) 0.2 % ophthalmic solution Place 1 drop into the left eye 2 (two) times daily.    . CONTOUR NEXT TEST test strip USE AS INSTRUCTED TO TEST BLOOD SUGAR UP TO 3 TIMES DAILY 100 strip 5  . cyclobenzaprine (FLEXERIL) 5 MG tablet Take 1 tablet (5 mg total) by mouth at bedtime as needed for muscle spasms. 15 tablet 0  . JARDIANCE 10 MG TABS tablet TAKE 1 TABLET BY MOUTH DAILY BEFORE BREAKFAST *FOR DIABETES* 90 tablet 1  . levothyroxine (SYNTHROID) 75 MCG tablet TAKE 1 TABLET BY MOUTH EVERY DAY IN THE MORNING 90 tablet 1  . lisinopril (ZESTRIL) 20 MG tablet TAKE 1 TABLET BY MOUTH EVERY DAY 90 tablet 1  . metFORMIN (GLUCOPHAGE) 1000 MG tablet TAKE 1 TABLET (1,000 MG TOTAL) BY MOUTH 2 (TWO) TIMES DAILY WITH A MEAL. 180 tablet 1  . PRESCRIPTION MEDICATION Eye injection for wet macular degeneration in her left eye monthly for life, given at [place] in [facility]    . valACYclovir (VALTREX) 500 MG tablet TAKE 1 TABLET BY MOUTH TWICE A DAY 6 tablet 0  . Vitamin D, Ergocalciferol, (DRISDOL) 1.25 MG (50000 UNIT) CAPS capsule TAKE 1 CAPSULE BY MOUTH ONCE WEEKLY 12 capsule 0   No current facility-administered medications on file prior to visit.    LMP 07/07/2013  Objective:   Physical Exam Pulmonary:     Effort: Pulmonary effort is normal.  Genitourinary:    Labia:        Right: No tenderness or lesion.        Left: No tenderness or lesion.      Vagina: No vaginal discharge.     Cervix: No cervical motion  tenderness or discharge.     Comments: Vaginal atrophy and dryness noted to labia majora Neurological:     Mental Status: She is alert.  Assessment & Plan:      This visit occurred during the SARS-CoV-2 public health emergency.  Safety protocols were in place, including screening questions prior to the visit, additional usage of staff PPE, and extensive cleaning of exam room while observing appropriate contact time as indicated for disinfecting solutions.

## 2020-05-25 NOTE — Patient Instructions (Signed)
We will be in touch tomorrow regarding your vaginal swab results.   Ensure you are consuming 64 ounces of water daily.  It was a pleasure to see you today!

## 2020-05-25 NOTE — Assessment & Plan Note (Signed)
Acute for the last week is on Jardiance for diabetes so there is a risk for vaginitis.  Exam today with obvious vaginal atrophy, no discharge or other abnormality.  UA today negative, trace glucose. Checking wet prep today. Await results.

## 2020-05-26 LAB — WET PREP BY MOLECULAR PROBE
Candida species: NOT DETECTED
Gardnerella vaginalis: NOT DETECTED
MICRO NUMBER:: 11659914
SPECIMEN QUALITY:: ADEQUATE
Trichomonas vaginosis: NOT DETECTED

## 2020-05-29 LAB — CYTOLOGY - PAP
Comment: NEGATIVE
Diagnosis: NEGATIVE
High risk HPV: NEGATIVE

## 2020-06-07 ENCOUNTER — Encounter: Payer: Self-pay | Admitting: *Deleted

## 2020-06-07 NOTE — Telephone Encounter (Signed)
This encounter was created in error - please disregard.

## 2020-06-09 ENCOUNTER — Telehealth: Payer: Self-pay | Admitting: *Deleted

## 2020-06-09 NOTE — Chronic Care Management (AMB) (Signed)
  Care Management   Note  06/09/2020 Name: Courtney Burns MRN: [id number] DOB: 10/06/1960  Courtney Burns is a 60 y.o. year old female who is a primary care patient of Pleas Koch, NP. I reached out to Alesia Morin by phone today in response to a referral sent by [name]'s health plan.    [name] was given information about care management services today including:  1. Care management services include personalized support from designated clinical staff supervised by her physician, including individualized plan of care and coordination with other care providers 2. 24/7 contact phone numbers for assistance for urgent and routine care needs. 3. The patient may stop care management services at any time by phone call to the office staff.  Patient agreed to services and verbal consent obtained.   Follow up plan: Telephone appointment with care management team member scheduled for:06/27/2020  Shonta Phillis  Care Guide, Embedded Care Coordination [place]  Care Management

## 2020-06-12 ENCOUNTER — Other Ambulatory Visit: Payer: Self-pay | Admitting: *Deleted

## 2020-06-12 NOTE — Patient Outreach (Signed)
[facility]) Care Management  06/12/2020  Courtney Burns 1960-11-17 [id number]   Patient is being transitioned to Chronic Care Management with the primary provider office, case closed.  [name], [place], MSN [place] [phone]

## 2020-06-23 ENCOUNTER — Encounter (INDEPENDENT_AMBULATORY_CARE_PROVIDER_SITE_OTHER): Payer: 59 | Admitting: Ophthalmology

## 2020-06-23 ENCOUNTER — Other Ambulatory Visit: Payer: Self-pay

## 2020-06-23 DIAGNOSIS — H353221 Exudative age-related macular degeneration, left eye, with active choroidal neovascularization: Secondary | ICD-10-CM

## 2020-06-23 DIAGNOSIS — H35033 Hypertensive retinopathy, bilateral: Secondary | ICD-10-CM | POA: Diagnosis not present

## 2020-06-23 DIAGNOSIS — I10 Essential (primary) hypertension: Secondary | ICD-10-CM

## 2020-06-23 DIAGNOSIS — H353112 Nonexudative age-related macular degeneration, right eye, intermediate dry stage: Secondary | ICD-10-CM | POA: Diagnosis not present

## 2020-06-23 DIAGNOSIS — H43813 Vitreous degeneration, bilateral: Secondary | ICD-10-CM

## 2020-06-25 ENCOUNTER — Other Ambulatory Visit: Payer: Self-pay | Admitting: Primary Care

## 2020-06-25 DIAGNOSIS — E119 Type 2 diabetes mellitus without complications: Secondary | ICD-10-CM

## 2020-06-27 ENCOUNTER — Telehealth: Payer: Self-pay

## 2020-06-27 ENCOUNTER — Telehealth: Payer: 59

## 2020-06-27 NOTE — Telephone Encounter (Signed)
  Chronic Care Management   Outreach Note  06/27/2020 Name: Courtney Burns MRN: [id number] DOB: 01-May-1960  Referred by: Pleas Koch, NP Reason for referral : Care Coordination (HTN)   An unsuccessful telephone outreach was attempted today. The patient was referred to the case management team for assistance with care management and care coordination.   Follow Up Plan: A HIPAA compliant phone message was left for the patient providing contact information and requesting a return call.   Quinn Plowman RN,BSN,CCM RN Case Manager [place]  [phone]

## 2020-06-30 ENCOUNTER — Telehealth: Payer: Self-pay | Admitting: *Deleted

## 2020-06-30 NOTE — Chronic Care Management (AMB) (Signed)
  Care Management   Note  06/30/2020 Name: Courtney Burns MRN: [id number] DOB: 07/23/1960  Courtney Burns is a 60 y.o. year old female who is a primary care patient of Pleas Koch, NP and is actively engaged with the care management team. I reached out to Alesia Morin by phone today to assist with re-scheduling a follow up visit with the RN Case Manager  Follow up plan: Unsuccessful telephone outreach attempt made. A HIPAA compliant phone message was left for the patient providing contact information and requesting a return call.  The care management team will reach out to the patient again over the next 7 days.  If patient returns call to provider office, please advise to call [place] at [phone].  [place] Management

## 2020-07-11 ENCOUNTER — Telehealth: Payer: Self-pay | Admitting: *Deleted

## 2020-07-11 NOTE — Chronic Care Management (AMB) (Signed)
  Care Management   Note  07/11/2020 Name: Courtney Burns MRN: [id number] DOB: 1960-12-28  Courtney Burns is a 60 y.o. year old female who is a primary care patient of Pleas Koch, NP and is actively engaged with the care management team. I reached out to Alesia Morin by phone today to assist with re-scheduling an initial visit with the RN Case Manager  Follow up plan: Second unsuccessful telephone outreach attempt made. A HIPAA compliant phone message was left for the patient providing contact information and requesting a return call. The care management team will reach out to the patient again over the next 7 days. If patient returns call to provider office, please advise to call [place] at [phone].  [place] Management

## 2020-07-17 ENCOUNTER — Telehealth: Payer: Self-pay | Admitting: *Deleted

## 2020-07-17 NOTE — Chronic Care Management (AMB) (Signed)
  Care Management   Note  07/17/2020 Name: KLANI CARIDI MRN: [id number] DOB: 1960/06/23  ALEXSIS KATHMAN is a 60 y.o. year old female who is a primary care patient of Pleas Koch, NP and is actively engaged with the care management team. I reached out to Alesia Morin by phone today to assist with re-scheduling a follow up visit with the RN Case Manager.  Follow up plan: Unsuccessful telephone outreach attempt made. A HIPAA compliant phone message was left for the patient providing contact information and requesting a return call. Unable to make contact on outreach attempts x 3. PCP Pleas Koch, NP notified via routed documentation in medical record.  We have been unable to make contact with the patient for follow up. The care management team is available to follow up with the patient after provider conversation with the patient regarding recommendation for care management engagement and subsequent re-referral to the care management team. If patient returns call to provider office, please advise to call [place] at [phone].  [place] Management

## 2020-07-23 ENCOUNTER — Other Ambulatory Visit: Payer: Self-pay | Admitting: Primary Care

## 2020-07-23 DIAGNOSIS — E039 Hypothyroidism, unspecified: Secondary | ICD-10-CM

## 2020-07-28 ENCOUNTER — Encounter (INDEPENDENT_AMBULATORY_CARE_PROVIDER_SITE_OTHER): Payer: 59 | Admitting: Ophthalmology

## 2020-07-28 ENCOUNTER — Other Ambulatory Visit: Payer: Self-pay

## 2020-07-28 DIAGNOSIS — H353221 Exudative age-related macular degeneration, left eye, with active choroidal neovascularization: Secondary | ICD-10-CM

## 2020-07-28 DIAGNOSIS — H353112 Nonexudative age-related macular degeneration, right eye, intermediate dry stage: Secondary | ICD-10-CM

## 2020-07-28 DIAGNOSIS — I10 Essential (primary) hypertension: Secondary | ICD-10-CM | POA: Diagnosis not present

## 2020-07-28 DIAGNOSIS — H35033 Hypertensive retinopathy, bilateral: Secondary | ICD-10-CM

## 2020-07-28 DIAGNOSIS — H43813 Vitreous degeneration, bilateral: Secondary | ICD-10-CM

## 2020-08-10 ENCOUNTER — Other Ambulatory Visit: Payer: 59

## 2020-08-10 ENCOUNTER — Encounter: Payer: 59 | Admitting: Primary Care

## 2020-08-12 ENCOUNTER — Other Ambulatory Visit: Payer: Self-pay | Admitting: Primary Care

## 2020-08-12 DIAGNOSIS — E119 Type 2 diabetes mellitus without complications: Secondary | ICD-10-CM

## 2020-08-31 ENCOUNTER — Encounter (INDEPENDENT_AMBULATORY_CARE_PROVIDER_SITE_OTHER): Payer: 59 | Admitting: Ophthalmology

## 2020-08-31 ENCOUNTER — Other Ambulatory Visit: Payer: Self-pay

## 2020-08-31 DIAGNOSIS — H35033 Hypertensive retinopathy, bilateral: Secondary | ICD-10-CM

## 2020-08-31 DIAGNOSIS — H43813 Vitreous degeneration, bilateral: Secondary | ICD-10-CM

## 2020-08-31 DIAGNOSIS — H353112 Nonexudative age-related macular degeneration, right eye, intermediate dry stage: Secondary | ICD-10-CM | POA: Diagnosis not present

## 2020-08-31 DIAGNOSIS — I10 Essential (primary) hypertension: Secondary | ICD-10-CM | POA: Diagnosis not present

## 2020-08-31 DIAGNOSIS — H353221 Exudative age-related macular degeneration, left eye, with active choroidal neovascularization: Secondary | ICD-10-CM | POA: Diagnosis not present

## 2020-09-12 ENCOUNTER — Other Ambulatory Visit: Payer: Self-pay | Admitting: Primary Care

## 2020-09-12 DIAGNOSIS — Z1231 Encounter for screening mammogram for malignant neoplasm of breast: Secondary | ICD-10-CM

## 2020-09-14 ENCOUNTER — Ambulatory Visit
Admission: RE | Admit: 2020-09-14 | Discharge: 2020-09-14 | Disposition: A | Discharge status: Home / Self Care | Payer: 59 | Source: Ambulatory Visit | Attending: Primary Care | Admitting: Primary Care

## 2020-09-14 ENCOUNTER — Other Ambulatory Visit: Payer: Self-pay

## 2020-09-14 DIAGNOSIS — Z1231 Encounter for screening mammogram for malignant neoplasm of breast: Secondary | ICD-10-CM | POA: Insufficient documentation

## 2020-09-19 ENCOUNTER — Encounter: Payer: 59 | Admitting: Primary Care

## 2020-09-24 ENCOUNTER — Other Ambulatory Visit: Payer: Self-pay | Admitting: Primary Care

## 2020-10-05 ENCOUNTER — Encounter (INDEPENDENT_AMBULATORY_CARE_PROVIDER_SITE_OTHER): Payer: 59 | Admitting: Ophthalmology

## 2020-10-05 ENCOUNTER — Other Ambulatory Visit: Payer: Self-pay

## 2020-10-05 DIAGNOSIS — H353221 Exudative age-related macular degeneration, left eye, with active choroidal neovascularization: Secondary | ICD-10-CM

## 2020-10-05 DIAGNOSIS — H35033 Hypertensive retinopathy, bilateral: Secondary | ICD-10-CM

## 2020-10-05 DIAGNOSIS — H353112 Nonexudative age-related macular degeneration, right eye, intermediate dry stage: Secondary | ICD-10-CM | POA: Diagnosis not present

## 2020-10-05 DIAGNOSIS — I10 Essential (primary) hypertension: Secondary | ICD-10-CM | POA: Diagnosis not present

## 2020-10-05 DIAGNOSIS — H43813 Vitreous degeneration, bilateral: Secondary | ICD-10-CM

## 2020-10-05 DIAGNOSIS — H2513 Age-related nuclear cataract, bilateral: Secondary | ICD-10-CM

## 2020-10-06 ENCOUNTER — Encounter (INDEPENDENT_AMBULATORY_CARE_PROVIDER_SITE_OTHER): Payer: 59 | Admitting: Ophthalmology

## 2020-10-09 ENCOUNTER — Other Ambulatory Visit: Payer: Self-pay

## 2020-10-09 ENCOUNTER — Ambulatory Visit: Payer: 59 | Admitting: Obstetrics and Gynecology

## 2020-10-09 ENCOUNTER — Encounter: Payer: Self-pay | Admitting: Obstetrics and Gynecology

## 2020-10-09 VITALS — BP 140/80 | Ht 64.0 in | Wt 194.0 lb

## 2020-10-09 DIAGNOSIS — N761 Subacute and chronic vaginitis: Secondary | ICD-10-CM | POA: Diagnosis not present

## 2020-10-09 LAB — POCT WET PREP WITH KOH
Clue Cells Wet Prep HPF POC: NEGATIVE
KOH Prep POC: NEGATIVE
Trichomonas, UA: NEGATIVE
Yeast Wet Prep HPF POC: NEGATIVE

## 2020-10-09 MED ORDER — CLOTRIMAZOLE-BETAMETHASONE 1-0.05 % EX CREA: TOPICAL_CREAM | CUTANEOUS | 0 refills | Status: DC

## 2020-10-09 NOTE — Progress Notes (Signed)
Pleas Koch, NP   Chief Complaint  Patient presents with   Vaginal Itching    Irritation, no discharge or odor on/off since March    HPI:      [name] is a 60 y.o. G2P0020 whose LMP was Patient's last menstrual period was 07/07/2013., presents today for NP> 3 yrs eval of chronic vaginal itching externally since 3/22 without increased d/c, odor. Has noticed some swelling and now feels like parts of labia are missing. Had neg yeast/BV culture with PCP 3/22. Been treating with replens, vagisil and benzocaine/menthol crm without relief. No yeast tx, no prior abx use. Pt with controlled type 2 DM. The itching is consuming at this point. Pt was using lever 2000 before sx started but has since changed to dove sens skin soap. Uses dryer sheets, no wipes, wears cotton underwear, no thongs. She is not sex active. Neg pap with PCP 3/22.    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 Minette Headland, [place] - polyp; nl   [place]   left salpingectomy   RIGHT OOPHORECTOMY Right 03/2003   Abscessed cyst removed   WISDOM TOOTH EXTRACTION      Family History  Problem Relation Age of Onset   Hypertension Mother    Diabetes Mother    Cancer Mother 33       GBM   Hyperlipidemia Father    Heart disease Father        MI    Hypertension Father    Diabetes Father    Depression Father    COPD Father    Diabetes Maternal Aunt        Uncontrolled with complications   Kidney disease Maternal Aunt        Renal failure - dialysis   Cancer Maternal Uncle 35       brain tumor   Stroke Maternal Grandmother    Liver cancer Maternal Grandfather    Cancer Maternal Grandfather 65       lung   Heart disease Paternal Grandmother        MI   Diabetes Paternal Grandmother    Prostate cancer Paternal Grandfather 45   Cancer - Prostate Paternal Grandfather    Colon cancer Neg Hx    Esophageal cancer Neg Hx    Rectal cancer Neg Hx    Stomach cancer Neg Hx    Breast cancer Neg Hx     Social History   Socioeconomic History   Marital status: Married    Spouse name: Not on file   Number of children: 0   Years of education: 16   Highest education level: Not on  file  Occupational History   Occupation: Lobbyist: [employer]    Comment: Software engineer  Tobacco Use   Smoking status: Never   Smokeless tobacco: Never  Vaping Use   Vaping Use: Never used  Substance and Sexual Activity   Alcohol use: No    Alcohol/week: 0.0 standard drinks   Drug use: No   Sexual activity: Not Currently    Partners: Male    Birth control/protection: Post-menopausal  Other Topics Concern   Not on file  Social History Narrative   Lives in [place].    Works for [employer].   Gerrianne grew up in [place]. She lives in [place] with her husband [name]) and their 3 cat ([name], [place], [place]).    She attended [employer] and obtained her Bachelors in [employer] with a minor in Youth worker.    She loves cooking and sewing.   Social Determinants of Health   Financial Resource Strain: Not on file  Food Insecurity: Not on file  Transportation Needs: Not on file  Physical Activity: Not on file  Stress: Not on file  Social Connections: Not on file  Intimate  Partner Violence: Not on file    Outpatient Medications Prior to Visit  Medication Sig Dispense Refill   brimonidine (ALPHAGAN) 0.2 % ophthalmic solution Place 1 drop into the left eye 2 (two) times daily.     CONTOUR NEXT TEST test strip USE AS INSTRUCTED TO TEST BLOOD SUGAR UP TO 3 TIMES DAILY 100 strip 5   cyclobenzaprine (FLEXERIL) 5 MG tablet Take 1 tablet (5 mg total) by mouth at bedtime as needed for muscle spasms. 15 tablet 0   JARDIANCE 10 MG TABS tablet TAKE 1 TABLET BY MOUTH DAILY BEFORE BREAKFAST *FOR DIABETES* 90 tablet 3   levothyroxine (SYNTHROID) 75 MCG tablet Take 1 tablet by mouth every morning on an empty stomach with water only.  No food or other medications for 30 minutes. 90 tablet 0   lisinopril (ZESTRIL) 20 MG tablet TAKE 1 TABLET BY MOUTH EVERY DAY 90 tablet 0   metFORMIN (GLUCOPHAGE) 1000 MG tablet Take 1 tablet (1,000 mg total) by mouth 2 (two) times daily with a meal. For diabetes. 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 (Patient not taking: Reported on 10/09/2020) 90 tablet 1   No facility-administered medications prior to visit.      ROS:  Review of Systems  Constitutional:  Negative for fever.  Gastrointestinal:  Negative for blood in stool, constipation, diarrhea, nausea and vomiting.  Genitourinary:  Positive for vaginal pain. Negative for dyspareunia, dysuria, flank pain, frequency, hematuria, urgency, vaginal bleeding and vaginal discharge.  Musculoskeletal:  Negative for back pain.  Skin:  Negative for rash.  BREAST: No  symptoms   OBJECTIVE:   Vitals:  BP 140/80   Ht 5' 4"  (1.626 m)   Wt 194 lb (88 kg)   LMP 07/07/2013   BMI 33.30 kg/m   Physical Exam Vitals reviewed.  Constitutional:  Appearance: She is well-developed.  Pulmonary:     Effort: Pulmonary effort is normal.  Genitourinary:    Pubic Area: No rash.      Labia:        Right: Rash and tenderness present. No lesion.        Left: Rash and tenderness present. No lesion.      Vagina: Normal. No vaginal discharge, erythema or tenderness.     Cervix: Normal.     Uterus: Normal. Not enlarged and not tender.      Adnexa: Right adnexa normal and left adnexa normal.       Right: No mass or tenderness.         Left: No mass or tenderness.       Comments: BILAT LABIA MAJORA AND MINORA AND CLITORIS WITH ERYTHEMA, IRRITATION, SWELLING; NO FISSURES OR LESIONS; NO EVID OF LS; VERY TENDER TO TOUCH Musculoskeletal:        General: Normal range of motion.     Cervical back: Normal range of motion.  Skin:    General: Skin is warm and dry.  Neurological:     General: No focal deficit present.     Mental Status: She is alert and oriented to person, place, and time.  Psychiatric:        Mood and Affect: Mood normal.        Behavior: Behavior normal.        Thought Content: Thought content normal.        Judgment: Judgment normal.    Results: Results for orders placed or performed in visit on 10/09/20 (from the past 24 hour(s))  POCT Wet Prep with KOH     Status: Normal   Collection Time: 10/09/20  4:56 PM  Result Value Ref Range   Trichomonas, UA Negative    Clue Cells Wet Prep HPF POC neg    Epithelial Wet Prep HPF POC     Yeast Wet Prep HPF POC neg    Bacteria Wet Prep HPF POC     RBC Wet Prep HPF POC     WBC Wet Prep HPF POC     KOH Prep POC Negative Negative     Assessment/Plan: Chronic vaginitis - Plan: clotrimazole-betamethasone (LOTRISONE) cream, POCT Wet Prep with KOH; pos sx and exam. Question fungal vs chem derm. Rx  lotrisone crm BID for 2 wks. F/u if sx persist. Line dry underwear, unscented detergent, dove sens skin soap.    Meds ordered this encounter  Medications   clotrimazole-betamethasone (LOTRISONE) cream    Sig: Apply externally BID for 2 wks    Dispense:  15 g    Refill:  0    Order Specific Question:   Supervising Provider    Answer:   Gae Dry [[id number]]      Return if symptoms worsen or fail to improve.  Natori Gudino B. Boe Deans, PA-C 10/09/2020 4:57 PM

## 2020-10-22 ENCOUNTER — Other Ambulatory Visit: Payer: Self-pay | Admitting: Primary Care

## 2020-10-22 DIAGNOSIS — E039 Hypothyroidism, unspecified: Secondary | ICD-10-CM

## 2020-11-08 ENCOUNTER — Encounter (INDEPENDENT_AMBULATORY_CARE_PROVIDER_SITE_OTHER): Payer: 59 | Admitting: Ophthalmology

## 2020-11-08 ENCOUNTER — Other Ambulatory Visit: Payer: Self-pay

## 2020-11-08 DIAGNOSIS — I10 Essential (primary) hypertension: Secondary | ICD-10-CM

## 2020-11-08 DIAGNOSIS — H43813 Vitreous degeneration, bilateral: Secondary | ICD-10-CM

## 2020-11-08 DIAGNOSIS — H35033 Hypertensive retinopathy, bilateral: Secondary | ICD-10-CM | POA: Diagnosis not present

## 2020-11-08 DIAGNOSIS — H353112 Nonexudative age-related macular degeneration, right eye, intermediate dry stage: Secondary | ICD-10-CM

## 2020-11-08 DIAGNOSIS — H353221 Exudative age-related macular degeneration, left eye, with active choroidal neovascularization: Secondary | ICD-10-CM

## 2020-11-09 ENCOUNTER — Encounter: Payer: 59 | Admitting: Primary Care

## 2020-11-09 ENCOUNTER — Telehealth: Payer: Self-pay | Admitting: Primary Care

## 2020-11-09 DIAGNOSIS — E119 Type 2 diabetes mellitus without complications: Secondary | ICD-10-CM

## 2020-11-09 NOTE — Telephone Encounter (Signed)
Patient is significantly overdue for diabetes follow up and must be seen ASAP for further refills. Please schedule.

## 2020-11-09 NOTE — Telephone Encounter (Signed)
Noted  

## 2020-11-09 NOTE — Telephone Encounter (Signed)
She is scheduled for 10/4 for CPE

## 2020-11-17 ENCOUNTER — Other Ambulatory Visit: Payer: Self-pay | Admitting: Primary Care

## 2020-11-17 DIAGNOSIS — E039 Hypothyroidism, unspecified: Secondary | ICD-10-CM

## 2020-11-19 ENCOUNTER — Other Ambulatory Visit: Payer: Self-pay | Admitting: Primary Care

## 2020-11-19 DIAGNOSIS — A6000 Herpesviral infection of urogenital system, unspecified: Secondary | ICD-10-CM

## 2020-12-04 ENCOUNTER — Other Ambulatory Visit: Payer: Self-pay | Admitting: Primary Care

## 2020-12-04 DIAGNOSIS — E119 Type 2 diabetes mellitus without complications: Secondary | ICD-10-CM

## 2020-12-12 ENCOUNTER — Encounter: Payer: Self-pay | Admitting: Primary Care

## 2020-12-12 ENCOUNTER — Other Ambulatory Visit: Payer: Self-pay

## 2020-12-12 ENCOUNTER — Ambulatory Visit (INDEPENDENT_AMBULATORY_CARE_PROVIDER_SITE_OTHER): Payer: 59 | Admitting: Primary Care

## 2020-12-12 VITALS — BP 132/82 | HR 72 | Temp 98.7°F | Ht 64.0 in | Wt 194.0 lb

## 2020-12-12 DIAGNOSIS — K7581 Nonalcoholic steatohepatitis (NASH): Secondary | ICD-10-CM | POA: Diagnosis not present

## 2020-12-12 DIAGNOSIS — E1165 Type 2 diabetes mellitus with hyperglycemia: Secondary | ICD-10-CM | POA: Diagnosis not present

## 2020-12-12 DIAGNOSIS — Z0000 Encounter for general adult medical examination without abnormal findings: Secondary | ICD-10-CM | POA: Diagnosis not present

## 2020-12-12 DIAGNOSIS — A6000 Herpesviral infection of urogenital system, unspecified: Secondary | ICD-10-CM

## 2020-12-12 DIAGNOSIS — Z23 Encounter for immunization: Secondary | ICD-10-CM

## 2020-12-12 DIAGNOSIS — E785 Hyperlipidemia, unspecified: Secondary | ICD-10-CM | POA: Diagnosis not present

## 2020-12-12 DIAGNOSIS — R69 Illness, unspecified: Secondary | ICD-10-CM | POA: Diagnosis not present

## 2020-12-12 DIAGNOSIS — E039 Hypothyroidism, unspecified: Secondary | ICD-10-CM

## 2020-12-12 DIAGNOSIS — I10 Essential (primary) hypertension: Secondary | ICD-10-CM

## 2020-12-12 DIAGNOSIS — N898 Other specified noninflammatory disorders of vagina: Secondary | ICD-10-CM | POA: Diagnosis not present

## 2020-12-12 DIAGNOSIS — H3530 Unspecified macular degeneration: Secondary | ICD-10-CM | POA: Diagnosis not present

## 2020-12-12 NOTE — Assessment & Plan Note (Signed)
Overall infrequent episodes.  Continue PRN Valtrex 500 mg.

## 2020-12-12 NOTE — Assessment & Plan Note (Signed)
Controlled in the office today, continue lisinopril 20 mg.  CMP pending.

## 2020-12-12 NOTE — Assessment & Plan Note (Signed)
Following with retinal specialist.

## 2020-12-12 NOTE — Assessment & Plan Note (Signed)
No longer on atorvastatin due to myalgias.  Consider pravastatin. Await lipid panel results.

## 2020-12-12 NOTE — Assessment & Plan Note (Signed)
Influenza vaccine provided today. Declines Shingrix. Other vaccines UTD. Pap smear UTD. Colonoscopy UTD, due 2027. Mammogram UTD.  Discussed the importance of a healthy diet and regular exercise in order for weight loss, and to reduce the risk of further co-morbidity.  Exam today stable Labs pending

## 2020-12-12 NOTE — Assessment & Plan Note (Signed)
She is mostly taking levothyroxine correctly, she does take her eye vitamins within 4 hours.  Repeat TSH pending. Continue levothyroxine 75 mcg.

## 2020-12-12 NOTE — Assessment & Plan Note (Signed)
Resolved.  Using clotrimazole-betamethasone cream BID PRN per GYN.  Consider discontinuing Jardiance if symptoms reoccur and are not responsive. She will update.

## 2020-12-12 NOTE — Assessment & Plan Note (Signed)
Repeat liver enzymes pending.

## 2020-12-12 NOTE — Assessment & Plan Note (Signed)
Repeat A1C pending.  Compliant to Jardiance 10 mg and metformin 1000 mg BID. Continue same.   Foot exam today. Eye exam UTD. Pneumonia vaccine UTD. Will change statin.  Managed on ACE-I  Follow up in 6 months.

## 2020-12-12 NOTE — Patient Instructions (Signed)
Stop by the lab prior to leaving today. I will notify you of your results once received.   We will see you in 6 months.  It was a pleasure to see you today!  Preventive Care 3-61 Years Old, Female Preventive care refers to lifestyle choices and visits with your health care provider that can promote health and wellness. This includes: A yearly physical exam. This is also called an annual wellness visit. Regular dental and eye exams. Immunizations. Screening for certain conditions. Healthy lifestyle choices, such as: Eating a healthy diet. Getting regular exercise. Not using drugs or products that contain nicotine and tobacco. Limiting alcohol use. What can I expect for my preventive care visit? Physical exam Your health care provider will check your: Height and weight. These may be used to calculate your BMI (body mass index). BMI is a measurement that tells if you are at a healthy weight. Heart rate and blood pressure. Body temperature. Skin for abnormal spots. Counseling Your health care provider may ask you questions about your: Past medical problems. Family's medical history. Alcohol, tobacco, and drug use. Emotional well-being. Home life and relationship well-being. Sexual activity. Diet, exercise, and sleep habits. Work and work Statistician. Access to firearms. Method of birth control. Menstrual cycle. Pregnancy history. What immunizations do I need? Vaccines are usually given at various ages, according to a schedule. Your health care provider will recommend vaccines for you based on your age, medical history, and lifestyle or other factors, such as travel or where you work. What tests do I need? Blood tests Lipid and cholesterol levels. These may be checked every 5 years, or more often if you are over 54 years old. Hepatitis C test. Hepatitis B test. Screening Lung cancer screening. You may have this screening every year starting at age 36 if you have a  30-pack-year history of smoking and currently smoke or have quit within the past 15 years. Colorectal cancer screening. All adults should have this screening starting at age 61 and continuing until age 72. Your health care provider may recommend screening at age 36 if you are at increased risk. You will have tests every 1-10 years, depending on your results and the type of screening test. Diabetes screening. This is done by checking your blood sugar (glucose) after you have not eaten for a while (fasting). You may have this done every 1-3 years. Mammogram. This may be done every 1-2 years. Talk with your health care provider about when you should start having regular mammograms. This may depend on whether you have a family history of breast cancer. BRCA-related cancer screening. This may be done if you have a family history of breast, ovarian, tubal, or peritoneal cancers. Pelvic exam and Pap test. This may be done every 3 years starting at age 39. Starting at age 64, this may be done every 5 years if you have a Pap test in combination with an HPV test. Other tests STD (sexually transmitted disease) testing, if you are at risk. Bone density scan. This is done to screen for osteoporosis. You may have this scan if you are at high risk for osteoporosis. Talk with your health care provider about your test results, treatment options, and if necessary, the need for more tests. Follow these instructions at home: Eating and drinking  Eat a diet that includes fresh fruits and vegetables, whole grains, lean protein, and low-fat dairy products. Take vitamin and mineral supplements as recommended by your health care provider. Do not drink alcohol if:  Your health care provider tells you not to drink. You are pregnant, may be pregnant, or are planning to become pregnant. If you drink alcohol: Limit how much you have to 0-1 drink a day. Be aware of how much alcohol is in your drink. In the [place] one  drink equals one 12 oz bottle of beer (355 mL), one 5 oz glass of wine (148 mL), or one 1 oz glass of hard liquor (44 mL). Lifestyle Take daily care of your teeth and gums. Brush your teeth every morning and night with fluoride toothpaste. Floss one time each day. Stay active. Exercise for at least 30 minutes 5 or more days each week. Do not use any products that contain nicotine or tobacco, such as cigarettes, e-cigarettes, and chewing tobacco. If you need help quitting, ask your health care provider. Do not use drugs. If you are sexually active, practice safe sex. Use a condom or other form of protection to prevent STIs (sexually transmitted infections). If you do not wish to become pregnant, use a form of birth control. If you plan to become pregnant, see your health care provider for a prepregnancy visit. If told by your health care provider, take low-dose aspirin daily starting at age 34. Find healthy ways to cope with stress, such as: Meditation, yoga, or listening to music. Journaling. Talking to a trusted person. Spending time with friends and family. Safety Always wear your seat belt while driving or riding in a vehicle. Do not drive: If you have been drinking alcohol. Do not ride with someone who has been drinking. When you are tired or distracted. While texting. Wear a helmet and other protective equipment during sports activities. If you have firearms in your house, make sure you follow all gun safety procedures. What's next? Visit your health care provider once a year for an annual wellness visit. Ask your health care provider how often you should have your eyes and teeth checked. Stay up to date on all vaccines. This information is not intended to replace advice given to you by your health care provider. Make sure you discuss any questions you have with your health care provider. Document Revised: 05/05/2020 Document Reviewed: 11/06/2017 Elsevier Patient Education  2022  [employer].

## 2020-12-12 NOTE — Progress Notes (Signed)
Subjective:    Patient ID: Courtney Burns, female    DOB: [birth year], 60 y.o.   MRN: [id number]  HPI  Courtney Burns is a very pleasant 60 y.o. female who presents today for complete physical and follow up of chronic conditions.  Immunizations: -Tetanus: 2018 -Influenza: Due today -Covid-19: 2 vaccines  -Shingles: Never completed  -Pneumonia: 2018  Diet: Fair diet.  Exercise: No regular exercise.  Eye exam: Completes regularly  Dental exam: Completes semi-annually   Pap Smear: Completed in 2022 Mammogram: Completed in July 2022 Colonoscopy: Completed in 2017, due 2027  BP Readings from Last 3 Encounters:  12/12/20 132/82  10/09/20 140/80  05/25/20 120/62       Review of Systems  Constitutional:  Negative for unexpected weight change.  HENT:  Negative for rhinorrhea.   Eyes:  Negative for visual disturbance.  Respiratory:  Negative for cough and shortness of breath.   Cardiovascular:  Negative for chest pain.  Gastrointestinal:  Negative for constipation and diarrhea.  Genitourinary:  Negative for difficulty urinating.  Musculoskeletal:  Negative for arthralgias and myalgias.  Skin:  Negative for rash.  Allergic/Immunologic: Negative for environmental allergies.  Neurological:  Negative for dizziness and headaches.  Psychiatric/Behavioral:  The patient is not nervous/anxious.         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    GERD (gastroesophageal reflux disease)    Hx - diet controlled,  no meds   History of chicken pox    History of Papanicolaou smear of cervix 08/2013   neg per pt   HSV infection    Hypertension    Hyperthyroidism    Hypothyroidism    had radiation then thyroid became low   Ileitis    Macular degeneration of left eye    Vitamin D deficiency     Social History   Socioeconomic History   Marital status: Married     Spouse name: Not on file   Number of children: 0   Years of education: 16   Highest education level: Not on file  Occupational History   Occupation: Lobbyist: [employer]    Comment: Software engineer  Tobacco Use   Smoking status: Never   Smokeless tobacco: Never  Vaping Use   Vaping Use: Never used  Substance and Sexual Activity   Alcohol use: No    Alcohol/week: 0.0 standard drinks   Drug use: No   Sexual activity: Not Currently    Partners: Male    Birth control/protection: Post-menopausal  Other Topics Concern   Not on file  Social History Narrative   Lives in [place].    Works for [employer].   Fumi grew up in [place]. She lives in [place] with her husband [name]) and their 3 cat ([name], [place], [place]).    She attended [employer] and obtained her Bachelors in [employer] with a minor in Youth worker.    She loves cooking and sewing.   Social Determinants of Health   Financial Resource Strain: Not on file  Food Insecurity: Not on file  Transportation Needs: Not on file  Physical Activity: Not on file  Stress: Not on file  Social Connections: Not on file  Intimate Partner Violence: Not on file    Past Surgical History:  Procedure Laterality Date   APPENDECTOMY  1970   BUNIONECTOMY Right 07/2001   right foot   CHOLECYSTECTOMY  2005   COLONOSCOPY  01/2004; 06/2015   Dr. Minette Headland, [place] - polyp; nl   [place]   left salpingectomy   RIGHT OOPHORECTOMY Right 03/2003   Abscessed cyst removed   WISDOM TOOTH EXTRACTION      Family History  Problem Relation Age of Onset   Hypertension Mother    Diabetes Mother    Cancer Mother 26       GBM   Hyperlipidemia Father    Heart disease Father        MI   Hypertension Father    Diabetes Father    Depression Father    COPD Father    Diabetes Maternal Aunt        Uncontrolled with complications   Kidney disease  Maternal Aunt        Renal failure - dialysis   Cancer Maternal Uncle 5       brain tumor   Stroke Maternal Grandmother    Liver cancer Maternal Grandfather    Cancer Maternal Grandfather 25       lung   Heart disease Paternal Grandmother        MI   Diabetes Paternal Grandmother    Prostate cancer Paternal Grandfather 66   Cancer - Prostate Paternal Grandfather    Colon cancer Neg Hx    Esophageal cancer Neg Hx    Rectal cancer Neg Hx    Stomach cancer Neg Hx    Breast cancer Neg Hx     Allergies  Allergen Reactions   Glipizide Itching   Sulfa Antibiotics Rash    Current Outpatient Medications on File Prior to Visit  Medication Sig Dispense Refill   brimonidine (ALPHAGAN) 0.2 % ophthalmic solution Place 1 drop into the left eye 2 (two) times daily.     clotrimazole-betamethasone (LOTRISONE) cream Apply externally BID for 2 wks 15 g 0   CONTOUR NEXT TEST test strip USE AS INSTRUCTED TO TEST BLOOD SUGAR UP TO 3 TIMES DAILY 100 strip 5   JARDIANCE 10 MG TABS tablet TAKE 1 TABLET BY MOUTH DAILY BEFORE BREAKFAST *FOR DIABETES* 90 tablet 3   levothyroxine (SYNTHROID) 75 MCG tablet 1 TAB EVERY AM ON AN EMPTY STOMACH WITH WATER ONLY. NO FOOD OR OTHER MEDICATIONS FOR 30 MINUTES. 90 tablet 0   lisinopril (ZESTRIL) 20 MG tablet TAKE 1 TABLET BY MOUTH EVERY DAY 90 tablet 0   metFORMIN (GLUCOPHAGE) 1000 MG tablet TAKE 1TAB BY MOUTH 2TIMES DAILY WITH A MEAL. FOR DIABETES. OFFICE VISIT REQUIRED FOR FURTHER REFILLS 60 tablet 0   PRESCRIPTION MEDICATION Eye injection for wet macular degeneration in her left eye monthly for life, given at [facility] in [place]     valACYclovir (VALTREX) 500 MG tablet TAKE 1 TABLET BY MOUTH TWICE A DAY 6 tablet 0   Vitamin D, Ergocalciferol, (DRISDOL) 1.25 MG (50000 UNIT) CAPS capsule TAKE 1 CAPSULE BY MOUTH ONCE WEEKLY 12 capsule 0   atorvastatin (LIPITOR) 20 MG tablet TAKE 1 TABLET (20 MG TOTAL) BY MOUTH EVERY EVENING. (Patient not taking: No  sig reported) 90 tablet 1   cyclobenzaprine (FLEXERIL) 5 MG tablet Take 1 tablet (5 mg total) by mouth at bedtime as needed for muscle spasms. (Patient not taking: Reported on 12/12/2020) 15 tablet 0   No current facility-administered medications on file prior to visit.    BP 132/82   Pulse 72  Temp 98.7 F (37.1 C) (Temporal)   Ht 5' 4"  (1.626 m)   Wt 194 lb (88 kg)   LMP 07/07/2013   SpO2 96%   BMI 33.30 kg/m  Objective:   Physical Exam HENT:     Right Ear: Tympanic membrane and ear canal normal.     Left Ear: Tympanic membrane and ear canal normal.     Nose: Nose normal.  Eyes:     Conjunctiva/sclera: Conjunctivae normal.     Pupils: Pupils are equal, round, and reactive to light.  Neck:     Thyroid: No thyromegaly.  Cardiovascular:     Rate and Rhythm: Normal rate and regular rhythm.     Heart sounds: No murmur heard. Pulmonary:     Effort: Pulmonary effort is normal.     Breath sounds: Normal breath sounds. No rales.  Abdominal:     General: Bowel sounds are normal.     Palpations: Abdomen is soft.     Tenderness: There is no abdominal tenderness.  Musculoskeletal:        General: Normal range of motion.     Cervical back: Neck supple.  Lymphadenopathy:     Cervical: No cervical adenopathy.  Skin:    General: Skin is warm and dry.     Findings: No rash.  Neurological:     Mental Status: She is alert and oriented to person, place, and time.     Cranial Nerves: No cranial nerve deficit.     Deep Tendon Reflexes: Reflexes are normal and symmetric.  Psychiatric:        Mood and Affect: Mood normal.          Assessment & Plan:      This visit occurred during the SARS-CoV-2 public health emergency.  Safety protocols were in place, including screening questions prior to the visit, additional usage of staff PPE, and extensive cleaning of exam room while observing appropriate contact time as indicated for disinfecting solutions.

## 2020-12-13 DIAGNOSIS — E785 Hyperlipidemia, unspecified: Secondary | ICD-10-CM

## 2020-12-13 LAB — COMPREHENSIVE METABOLIC PANEL
ALT: 23 U/L (ref 0–35)
AST: 20 U/L (ref 0–37)
Albumin: 4.5 g/dL (ref 3.5–5.2)
Alkaline Phosphatase: 93 U/L (ref 39–117)
BUN: 19 mg/dL (ref 6–23)
CO2: 26 mEq/L (ref 19–32)
Calcium: 9.7 mg/dL (ref 8.4–10.5)
Chloride: 102 mEq/L (ref 96–112)
Creatinine, Ser: 0.79 mg/dL (ref 0.40–1.20)
GFR: 80.64 mL/min (ref >60.00)
Glucose, Bld: 102 mg/dL — ABNORMAL HIGH (ref 70–99)
Potassium: 4.4 mEq/L (ref 3.5–5.1)
Sodium: 139 mEq/L (ref 135–145)
Total Bilirubin: 0.4 mg/dL (ref 0.2–1.2)
Total Protein: 6.6 g/dL (ref 6.0–8.3)

## 2020-12-13 LAB — LIPID PANEL
Cholesterol: 212 mg/dL — ABNORMAL HIGH (ref 0–200)
HDL: 36.3 mg/dL — ABNORMAL LOW (ref >39.00)
NonHDL: 175.77
Total CHOL/HDL Ratio: 6
Triglycerides: 385 mg/dL — ABNORMAL HIGH (ref 0.0–149.0)
VLDL: 77 mg/dL — ABNORMAL HIGH (ref 0.0–40.0)

## 2020-12-13 LAB — HEMOGLOBIN A1C: Hgb A1c MFr Bld: 7.3 % — ABNORMAL HIGH (ref 4.6–6.5)

## 2020-12-13 LAB — TSH: TSH: 2.65 u[IU]/mL (ref 0.35–5.50)

## 2020-12-13 LAB — LDL CHOLESTEROL, DIRECT: Direct LDL: 118 mg/dL

## 2020-12-13 MED ORDER — PRAVASTATIN SODIUM 40 MG PO TABS: 40.0000 mg | ORAL_TABLET | Freq: Every day | ORAL | 3 refills | Status: DC

## 2020-12-15 ENCOUNTER — Encounter (INDEPENDENT_AMBULATORY_CARE_PROVIDER_SITE_OTHER): Payer: 59 | Admitting: Ophthalmology

## 2020-12-15 LAB — HM DIABETES EYE EXAM

## 2020-12-20 ENCOUNTER — Encounter (INDEPENDENT_AMBULATORY_CARE_PROVIDER_SITE_OTHER): Payer: 59 | Admitting: Ophthalmology

## 2020-12-20 ENCOUNTER — Other Ambulatory Visit: Payer: Self-pay

## 2020-12-20 ENCOUNTER — Encounter: Payer: Self-pay | Admitting: Primary Care

## 2020-12-20 DIAGNOSIS — H353221 Exudative age-related macular degeneration, left eye, with active choroidal neovascularization: Secondary | ICD-10-CM

## 2020-12-20 DIAGNOSIS — H353112 Nonexudative age-related macular degeneration, right eye, intermediate dry stage: Secondary | ICD-10-CM

## 2020-12-20 DIAGNOSIS — H2513 Age-related nuclear cataract, bilateral: Secondary | ICD-10-CM

## 2020-12-20 DIAGNOSIS — H35033 Hypertensive retinopathy, bilateral: Secondary | ICD-10-CM

## 2020-12-20 DIAGNOSIS — I10 Essential (primary) hypertension: Secondary | ICD-10-CM | POA: Diagnosis not present

## 2020-12-20 DIAGNOSIS — H43813 Vitreous degeneration, bilateral: Secondary | ICD-10-CM

## 2020-12-22 ENCOUNTER — Other Ambulatory Visit: Payer: Self-pay | Admitting: Primary Care

## 2020-12-22 DIAGNOSIS — I10 Essential (primary) hypertension: Secondary | ICD-10-CM

## 2021-01-04 DIAGNOSIS — E785 Hyperlipidemia, unspecified: Secondary | ICD-10-CM

## 2021-01-05 ENCOUNTER — Other Ambulatory Visit: Payer: Self-pay | Admitting: Primary Care

## 2021-01-05 DIAGNOSIS — E119 Type 2 diabetes mellitus without complications: Secondary | ICD-10-CM

## 2021-01-25 ENCOUNTER — Encounter (INDEPENDENT_AMBULATORY_CARE_PROVIDER_SITE_OTHER): Payer: 59 | Admitting: Ophthalmology

## 2021-01-25 ENCOUNTER — Other Ambulatory Visit: Payer: Self-pay

## 2021-01-25 DIAGNOSIS — H353221 Exudative age-related macular degeneration, left eye, with active choroidal neovascularization: Secondary | ICD-10-CM

## 2021-01-25 DIAGNOSIS — I10 Essential (primary) hypertension: Secondary | ICD-10-CM | POA: Diagnosis not present

## 2021-01-25 DIAGNOSIS — H353112 Nonexudative age-related macular degeneration, right eye, intermediate dry stage: Secondary | ICD-10-CM

## 2021-01-25 DIAGNOSIS — H43813 Vitreous degeneration, bilateral: Secondary | ICD-10-CM

## 2021-01-25 DIAGNOSIS — H35033 Hypertensive retinopathy, bilateral: Secondary | ICD-10-CM | POA: Diagnosis not present

## 2021-02-06 MED ORDER — ROSUVASTATIN CALCIUM 5 MG PO TABS
5.0000 mg | ORAL_TABLET | Freq: Every day | ORAL | 0 refills | Status: DC
Start: 2021-02-06 — End: 2021-03-05

## 2021-02-17 ENCOUNTER — Other Ambulatory Visit: Payer: Self-pay | Admitting: Primary Care

## 2021-02-17 DIAGNOSIS — E039 Hypothyroidism, unspecified: Secondary | ICD-10-CM

## 2021-03-01 ENCOUNTER — Encounter (INDEPENDENT_AMBULATORY_CARE_PROVIDER_SITE_OTHER): Payer: 59 | Admitting: Ophthalmology

## 2021-03-01 ENCOUNTER — Other Ambulatory Visit: Payer: Self-pay

## 2021-03-01 DIAGNOSIS — H353112 Nonexudative age-related macular degeneration, right eye, intermediate dry stage: Secondary | ICD-10-CM

## 2021-03-01 DIAGNOSIS — H35033 Hypertensive retinopathy, bilateral: Secondary | ICD-10-CM

## 2021-03-01 DIAGNOSIS — H43813 Vitreous degeneration, bilateral: Secondary | ICD-10-CM

## 2021-03-01 DIAGNOSIS — H353221 Exudative age-related macular degeneration, left eye, with active choroidal neovascularization: Secondary | ICD-10-CM | POA: Diagnosis not present

## 2021-03-01 DIAGNOSIS — I10 Essential (primary) hypertension: Secondary | ICD-10-CM | POA: Diagnosis not present

## 2021-03-01 DIAGNOSIS — H2513 Age-related nuclear cataract, bilateral: Secondary | ICD-10-CM

## 2021-03-05 ENCOUNTER — Other Ambulatory Visit: Payer: Self-pay | Admitting: Primary Care

## 2021-03-05 DIAGNOSIS — E785 Hyperlipidemia, unspecified: Secondary | ICD-10-CM

## 2021-03-20 ENCOUNTER — Other Ambulatory Visit: Payer: Self-pay

## 2021-03-20 ENCOUNTER — Encounter: Payer: Self-pay | Admitting: Internal Medicine

## 2021-03-20 ENCOUNTER — Telehealth: Payer: 59 | Admitting: Internal Medicine

## 2021-03-20 DIAGNOSIS — J0140 Acute pansinusitis, unspecified: Secondary | ICD-10-CM | POA: Insufficient documentation

## 2021-03-20 HISTORY — DX: Acute pansinusitis, unspecified: J01.40

## 2021-03-20 MED ORDER — AMOXICILLIN 500 MG PO TABS: 1000.0000 mg | ORAL_TABLET | Freq: Two times a day (BID) | ORAL | 0 refills | Status: AC

## 2021-03-20 MED ORDER — FLUCONAZOLE 150 MG PO TABS: 150.0000 mg | ORAL_TABLET | Freq: Once | ORAL | 1 refills | Status: AC

## 2021-03-20 NOTE — Telephone Encounter (Signed)
I spoke with pt; pt said for about 1 wk having dry cough,no CP or SOB, having runny nose and when blows nose has yellow green blood tinged mucus,today face above lt eye and below lt eye hurting, teeth hurt. Pt has tested x 3 with home covid test that are neg. Last neg covid test was 03/18/21. Pt scheduled VV with Dr Silvio Pate 03/20/21 at 11:15. Pt will have vital signs ready when CMA calls.pt will drink plenty of fluids, rest, and take Tylenol for fever. UC & ED precautions given and pt voiced understanding. Sending note to Dr Manning Charity CMA and Allie Bossier NP as PCP and Juluis Rainier.

## 2021-03-20 NOTE — Assessment & Plan Note (Addendum)
Sick for over a week with symptoms that suggest a secondary bacterial sinus infection Continue analgesics Delsym prn Will treat with amoxil as well  Okay to return to work when she feels up to it

## 2021-03-20 NOTE — Telephone Encounter (Signed)
Was seen and treatment started for sinus infection

## 2021-03-20 NOTE — Progress Notes (Signed)
Subjective:    Patient ID: Courtney Burns, female    DOB: 1960-08-19, 61 y.o.   MRN: [id number]  HPI Video virtual visit due to respiratory symptoms Identification done Reviewed limitations and billing and she gave consent Participants---patient in her home and I am in my office  Started just over a week ago--with bad sore throat Then it moved into head and chest Stayed out of work on Friday and took it easy in the past few days Not improving  Biggest symptoms are facial pain---teeth hurting Right and left Purulent nasal drainage PND--causing cough No fever No SOB  Negative COVID tests  Current Outpatient Medications on File Prior to Visit  Medication Sig Dispense Refill   brimonidine (ALPHAGAN) 0.2 % ophthalmic solution Place 1 drop into the left eye 2 (two) times daily.     clotrimazole-betamethasone (LOTRISONE) cream Apply externally BID for 2 wks 15 g 0   CONTOUR NEXT TEST test strip USE AS INSTRUCTED TO TEST BLOOD SUGAR UP TO 3 TIMES DAILY 100 strip 5   levothyroxine (SYNTHROID) 75 MCG tablet 1 TAB EVERY AM ON AN EMPTY STOMACH WITH WATER ONLY. NO FOOD OR OTHER MEDICATIONS FOR 30 MINUTES. 90 tablet 2   lisinopril (ZESTRIL) 20 MG tablet Take 1 tablet (20 mg total) by mouth daily. For blood pressure. 90 tablet 3   metFORMIN (GLUCOPHAGE) 1000 MG tablet Take 1 tablet (1,000 mg total) by mouth 2 (two) times daily with a meal. For diabetes. 180 tablet 1   PRESCRIPTION MEDICATION Eye injection for wet macular degeneration in her left eye monthly for life, given at [place] in [facility]     rosuvastatin (CRESTOR) 5 MG tablet TAKE 1 TABLET (5 MG TOTAL) BY MOUTH DAILY. FOR CHOLESTEROL. 90 tablet 2   valACYclovir (VALTREX) 500 MG tablet TAKE 1 TABLET BY MOUTH TWICE A DAY 6 tablet 0   Vitamin D, Ergocalciferol, (DRISDOL) 1.25 MG (50000 UNIT) CAPS capsule TAKE 1 CAPSULE BY MOUTH ONCE WEEKLY 12 capsule 0   No current facility-administered medications on file prior to  visit.    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 Minette Headland, [place] - polyp; nl   [place]   left salpingectomy   RIGHT OOPHORECTOMY Right 03/2003   Abscessed cyst removed   WISDOM TOOTH EXTRACTION      Family History  Problem Relation Age of Onset   Hypertension Mother    Diabetes Mother    Cancer Mother 75       GBM   Hyperlipidemia Father    Heart disease Father  MI   Hypertension Father    Diabetes Father    Depression Father    COPD Father    Diabetes Maternal Aunt        Uncontrolled with complications   Kidney disease Maternal Aunt        Renal failure - dialysis   Cancer Maternal Uncle 23       brain tumor   Stroke Maternal Grandmother    Liver cancer Maternal Grandfather    Cancer Maternal Grandfather 70       lung   Heart disease Paternal Grandmother        MI   Diabetes Paternal Grandmother    Prostate cancer Paternal Grandfather 76   Cancer - Prostate Paternal Grandfather    Colon cancer Neg Hx    Esophageal cancer Neg Hx    Rectal cancer Neg Hx    Stomach cancer Neg Hx    Breast cancer Neg Hx     Social History   Socioeconomic History   Marital status: Married    Spouse name: Not on file   Number  of children: 0   Years of education: 16   Highest education level: Not on file  Occupational History   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    Works for [employer].   Courtney Burns grew up in [place]. She lives in [place] with her husband Courtney Burns) and their 3 cat (Courtney Burns, Courtney Burns, Courtney Burns).    She attended [employer] and obtained her Bachelors in [employer] with a minor in Youth worker.    She loves cooking and sewing.   Social Determinants of Health   Financial Resource Strain: Not on file  Food Insecurity: Not on file  Transportation Needs: Not on file  Physical Activity: Not on file  Stress: Not on file  Social Connections: Not on file  Intimate Partner Violence: Not on file   Review of Systems No N/V Eating okay Some disruption of sleep due to cough---delsym and sleeping in recliner did help     Objective:   Physical Exam Constitutional:      Appearance: Normal appearance.  Pulmonary:     Effort: Pulmonary effort is normal. No respiratory distress.  Neurological:     Mental Status: She is alert.           Assessment & Plan:

## 2021-03-21 NOTE — Telephone Encounter (Signed)
Noted and appreciate the assistance.

## 2021-04-06 ENCOUNTER — Other Ambulatory Visit: Payer: Self-pay

## 2021-04-06 ENCOUNTER — Encounter (INDEPENDENT_AMBULATORY_CARE_PROVIDER_SITE_OTHER): Payer: 59 | Admitting: Ophthalmology

## 2021-04-06 DIAGNOSIS — I10 Essential (primary) hypertension: Secondary | ICD-10-CM | POA: Diagnosis not present

## 2021-04-06 DIAGNOSIS — H353112 Nonexudative age-related macular degeneration, right eye, intermediate dry stage: Secondary | ICD-10-CM | POA: Diagnosis not present

## 2021-04-06 DIAGNOSIS — H43813 Vitreous degeneration, bilateral: Secondary | ICD-10-CM

## 2021-04-06 DIAGNOSIS — H35033 Hypertensive retinopathy, bilateral: Secondary | ICD-10-CM | POA: Diagnosis not present

## 2021-04-06 DIAGNOSIS — H353221 Exudative age-related macular degeneration, left eye, with active choroidal neovascularization: Secondary | ICD-10-CM | POA: Diagnosis not present

## 2021-05-11 ENCOUNTER — Encounter (INDEPENDENT_AMBULATORY_CARE_PROVIDER_SITE_OTHER): Payer: 59 | Admitting: Ophthalmology

## 2021-05-11 ENCOUNTER — Other Ambulatory Visit: Payer: Self-pay

## 2021-05-11 DIAGNOSIS — H353221 Exudative age-related macular degeneration, left eye, with active choroidal neovascularization: Secondary | ICD-10-CM

## 2021-05-11 DIAGNOSIS — H35033 Hypertensive retinopathy, bilateral: Secondary | ICD-10-CM | POA: Diagnosis not present

## 2021-05-11 DIAGNOSIS — I10 Essential (primary) hypertension: Secondary | ICD-10-CM

## 2021-05-11 DIAGNOSIS — H2513 Age-related nuclear cataract, bilateral: Secondary | ICD-10-CM

## 2021-05-11 DIAGNOSIS — H43813 Vitreous degeneration, bilateral: Secondary | ICD-10-CM | POA: Diagnosis not present

## 2021-05-11 DIAGNOSIS — H353112 Nonexudative age-related macular degeneration, right eye, intermediate dry stage: Secondary | ICD-10-CM | POA: Diagnosis not present

## 2021-06-12 ENCOUNTER — Other Ambulatory Visit: Payer: Self-pay | Admitting: Primary Care

## 2021-06-12 ENCOUNTER — Encounter: Payer: Self-pay | Admitting: Primary Care

## 2021-06-12 ENCOUNTER — Ambulatory Visit: Payer: 59 | Admitting: Primary Care

## 2021-06-12 VITALS — BP 152/102 | HR 96 | Temp 97.8°F | Ht 64.0 in | Wt 203.5 lb

## 2021-06-12 DIAGNOSIS — M25519 Pain in unspecified shoulder: Secondary | ICD-10-CM | POA: Insufficient documentation

## 2021-06-12 DIAGNOSIS — E1165 Type 2 diabetes mellitus with hyperglycemia: Secondary | ICD-10-CM

## 2021-06-12 DIAGNOSIS — I10 Essential (primary) hypertension: Secondary | ICD-10-CM | POA: Diagnosis not present

## 2021-06-12 DIAGNOSIS — M25511 Pain in right shoulder: Secondary | ICD-10-CM

## 2021-06-12 DIAGNOSIS — E785 Hyperlipidemia, unspecified: Secondary | ICD-10-CM | POA: Diagnosis not present

## 2021-06-12 HISTORY — DX: Pain in unspecified shoulder: M25.519

## 2021-06-12 LAB — POCT GLYCOSYLATED HEMOGLOBIN (HGB A1C): Hemoglobin A1C: 7.9 % — AB (ref 4.0–5.6)

## 2021-06-12 LAB — LIPID PANEL
Cholesterol: 207 mg/dL — ABNORMAL HIGH (ref 0–200)
HDL: 37.4 mg/dL — ABNORMAL LOW (ref >39.00)
NonHDL: 169.5
Total CHOL/HDL Ratio: 6
Triglycerides: 227 mg/dL — ABNORMAL HIGH (ref 0.0–149.0)
VLDL: 45.4 mg/dL — ABNORMAL HIGH (ref 0.0–40.0)

## 2021-06-12 LAB — LDL CHOLESTEROL, DIRECT: Direct LDL: 133 mg/dL

## 2021-06-12 MED ORDER — TIRZEPATIDE 2.5 MG/0.5ML ~~LOC~~ SOAJ: 2.5000 mg | SUBCUTANEOUS | 0 refills | Status: DC

## 2021-06-12 NOTE — Telephone Encounter (Signed)
I changed prescription to Ozempic. ?Please call patient and notify her to inject 0.25 mg once weekly for 4 weeks, then increase to inject 0.5 mg weekly thereafter. ?

## 2021-06-12 NOTE — Assessment & Plan Note (Signed)
Suspect bursitis, she will notify if symptoms do not continue to improve. ? ?Continue Advil 200 mg daily as needed. ? ?Avoid heavy lifting for now. ?Return precautions provided. ?

## 2021-06-12 NOTE — Assessment & Plan Note (Signed)
Deteriorated with A1c today of 7.9. ? ?Could not tolerate Jardiance given vaginal itching ? ?Continue metformin 1000 mg twice daily. ?Add Mounjaro 2.5 mg weekly. ? ?We will plan to see her back in 3 months for follow-up. ?

## 2021-06-12 NOTE — Progress Notes (Signed)
? ?Subjective:  ? ? Patient ID: Courtney Burns, female    DOB: 10/13/1960, 61 y.o.   MRN: [id number] ? ?HPI ? ?Courtney Burns is a very pleasant 61 y.o. female with a history of hypertension, nonalcoholic steatohepatitis, type 2 diabetes, hyperlipidemia, elevated LFTs who presents today for follow-up of type 2 diabetes. She would also like to discuss shoulder pain. ? ?1) Type 2 Diabetes: ? ?Current medications include: Metformin 1000 mg twice daily. She is no longer on Jardiance as it caused vaginal itching.  ? ?She denies polyuria, polydipsia, polyuria.  ? ?She is checking her blood glucose every other day and is getting readings of 160's.  ? ?Last A1C: 7.3 in October 2022, ?Last Eye Exam: Up-to-date ?Last Foot Exam: Up-to-date ?Pneumonia Vaccination: 2018 ?Urine Microalbumin: None, managed on ACE inhibitor ?Statin: Rosuvastatin ? ?Dietary changes since last visit: None.  ? ? ?Exercise: She's been walking four days weekly.  ? ?BP Readings from Last 3 Encounters:  ?06/12/21 (!) 148/96  ?12/12/20 132/82  ?10/09/20 140/80  ? ?2) Acute Shoulder Pain: Acute to the right shoulder for three weeks. Occurred just after waking one morning, felt that she slept on her shoulder "wrong". The same day she was lifting a heavy bag, felt her pain increase as soon as she pulled the bag out of her car. 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  ? ?She's been taking Advil 200 mg daily, applying Icy Hot and a heating pad. This week she's noticed a significant improvement in her ROM and pain.  ? ?She has a history of bursitis to the left shoulder, these symptoms feel very similar.  ? ?3) Essential Hypertension: Currently managed on lisinopril 20 mg daily, she endorses daily compliance.  She is felt a little off over the last 24 hours, felt anxious yesterday.  She does have a blood pressure cuff at home, has not recently checked her blood pressure readings. ? ?She denies chest  pain, dizziness.  ? ?Review of Systems  ?Eyes:  Negative for visual disturbance.  ?Respiratory:  Negative for shortness of breath.   ?Cardiovascular:  Negative for chest pain.  ?Musculoskeletal:  Positive for arthralgias. Negative for myalgias.  ?Neurological:  Negative for numbness.  ? ?   ? ? ?Past Medical History:  ?Diagnosis Date  ? Diabetes mellitus without complication ([place])   ? type 2  ? 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 GERD (gastroesophageal reflux disease)   ? Hx - diet controlled,  no meds  ? History of chicken pox   ? History of Papanicolaou smear of cervix 08/2013  ? neg per pt  ? HSV infection   ? Hypertension   ? Hyperthyroidism   ? Hypothyroidism   ? had radiation then thyroid became low  ? Ileitis   ? Macular degeneration of left eye   ? Vitamin D deficiency   ? ? ?Social History  ? ?Socioeconomic History  ? Marital status: Married  ?  Spouse name: Not on file  ? Number of children: 0  ? Years of education: 66  ? Highest education level: Not on file  ?Occupational History  ? Occupation: Heritage manager  ?  Employer: [employer]  ?  Comment: [employer]  ?Tobacco Use  ? Smoking status: Never  ? Smokeless tobacco: Never  ?Vaping Use  ? Vaping Use: Never used  ?Substance and Sexual Activity  ? Alcohol use: No  ?  Alcohol/week: 0.0 standard drinks  ? Drug use: No  ? Sexual activity: Not Currently  ?  Partners: Male  ?  Birth control/protection: Post-menopausal  ?Other Topics Concern  ? Not on file  ?Social History Narrative  ? Lives in [place].   ? Works for [employer].  ? Drea grew up in [place]. She lives in [place] with her husband [name]) and their 3 cat ([name], [place], [place]).   ? She attended [employer] and obtained her Bachelors in [employer] with a minor in Youth worker.   ? She loves cooking and sewing.  ? ?Social Determinants of Health  ? ?Financial Resource Strain: Not on file  ?Food Insecurity: Not on file   ?Transportation Needs: Not on file  ?Physical Activity: Not on file  ?Stress: Not on file  ?Social Connections: Not on file  ?Intimate Partner Violence: Not on file  ? ? ?Past Surgical History:  ?Procedure Laterality Date  ? APPENDECTOMY  1970  ? BUNIONECTOMY Right 07/2001  ? right foot  ? CHOLECYSTECTOMY  2005  ? COLONOSCOPY  01/2004; 06/2015  ? Dr. Minette Headland, [place] - polyp; nl  ? [place]  ? left salpingectomy  ? RIGHT OOPHORECTOMY Right 03/2003  ? Abscessed cyst removed  ? WISDOM TOOTH EXTRACTION    ? ? ?Family History  ?Problem Relation Age of Onset  ? Hypertension Mother   ? Diabetes Mother   ? Cancer Mother 69  ?     GBM  ? Hyperlipidemia Father   ? Heart disease Father   ?     MI  ? Hypertension Father   ? Diabetes Father   ? Depression Father   ? COPD Father   ? Diabetes Maternal Aunt   ?     Uncontrolled with complications  ? Kidney disease Maternal Aunt   ?     Renal failure - dialysis  ? Cancer Maternal Uncle 66  ?     brain tumor  ? Stroke Maternal Grandmother   ? Liver cancer Maternal Grandfather   ? Cancer Maternal Grandfather 8  ?     lung  ? Heart disease Paternal Grandmother   ?     MI  ? Diabetes Paternal Grandmother   ? Prostate cancer Paternal Grandfather 22  ? Cancer - Prostate Paternal Grandfather   ? Colon cancer Neg Hx   ? Esophageal cancer Neg Hx   ? Rectal cancer Neg Hx   ? Stomach cancer Neg Hx   ? Breast cancer Neg Hx   ? ? ?Allergies  ?Allergen Reactions  ? Glipizide Itching  ? Sulfa Antibiotics Rash  ? ? ?Current Outpatient Medications on File Prior to Visit  ?Medication Sig Dispense Refill  ? brimonidine (ALPHAGAN) 0.2 % ophthalmic solution Place 1 drop into the left eye 2 (two) times daily.    ? clotrimazole-betamethasone (LOTRISONE) cream Apply externally BID for 2 wks 15 g 0  ? CONTOUR NEXT TEST test strip USE AS INSTRUCTED TO TEST BLOOD SUGAR UP TO 3 TIMES DAILY 100 strip 5  ? levothyroxine (SYNTHROID) 75 MCG tablet 1 TAB EVERY AM ON AN EMPTY STOMACH WITH  WATER ONLY. NO FOOD OR OTHER MEDICATIONS FOR 30 MINUTES. 90 tablet 2  ? lisinopril (ZESTRIL) 20 MG tablet Take 1 tablet (20 mg total) by mouth daily. For blood pressure. 90 tablet 3  ? metFORMIN (GLUCOPHAGE) 1000 MG tablet Take 1 tablet (1,000 mg total) by mouth 2 (two) times  daily with a meal. For diabetes. 180 tablet 1  ? PRESCRIPTION MEDICATION Eye injection for wet macular degeneration in her left eye monthly for life, given at [place] in [facility]    ? rosuvastatin (CRESTOR) 5 MG tablet TAKE 1 TABLET (5 MG TOTAL) BY MOUTH DAILY. FOR CHOLESTEROL. 90 tablet 2  ? valACYclovir (VALTREX) 500 MG tablet TAKE 1 TABLET BY MOUTH TWICE A DAY 6 tablet 0  ? Vitamin D, Ergocalciferol, (DRISDOL) 1.25 MG (50000 UNIT) CAPS capsule TAKE 1 CAPSULE BY MOUTH ONCE WEEKLY 12 capsule 0  ? ?No current facility-administered medications on file prior to visit.  ? ? ?BP (!) 148/96 (BP Location: Right Arm, Cuff Size: Large)   Pulse 96   Temp 97.8 ?F (36.6 ?C) (Temporal)   Ht 5' 4"  (1.626 m)   Wt 203 lb 8 oz (92.3 kg)   LMP 07/07/2013   SpO2 95%   BMI 34.93 kg/m?  ?Objective:  ? Physical Exam ?Cardiovascular:  ?   Rate and Rhythm: Normal rate and regular rhythm.  ?Pulmonary:  ?   Effort: Pulmonary effort is normal.  ?   Breath sounds: Normal breath sounds.  ?Musculoskeletal:  ?   Right shoulder: Decreased range of motion.  ?   Left shoulder: Normal range of motion.  ?   Cervical back: Neck supple.  ?Skin: ?   General: Skin is warm and dry.  ? ? ? ? ? ?   ?Assessment & Plan:  ? ? ? ? ?This visit occurred during the SARS-CoV-2 public health emergency.  Safety protocols were in place, including screening questions prior to the visit, additional usage of staff PPE, and extensive cleaning of exam room while observing appropriate contact time as indicated for disinfecting solutions.  ?

## 2021-06-12 NOTE — Assessment & Plan Note (Signed)
Tolerating rosuvastatin 5 mg well. ?Repeat lipid panel pending. ?

## 2021-06-12 NOTE — Telephone Encounter (Signed)
Did you want to change this medication? Only seen Mounjaro listed from today's visit?  ?

## 2021-06-12 NOTE — Patient Instructions (Signed)
Start Mounjaro 2.5 mg once weekly diabetes.  ? ?Continue metformin 1000 mg twice daily. ? ?Monitor your blood pressure readings, notify me if you consistently see readings at or above 135 on top and/or 90 on bottom. ? ?Please schedule a follow up visit for 3 months. ? ?It was a pleasure to see you today! ? ? ?

## 2021-06-12 NOTE — Assessment & Plan Note (Signed)
Above goal today, even on recheck, she does feel anxious. ? ?Advised that she monitor blood pressure at home and notify if she sees readings at or above 135/90 consistently. ? ?Continue lisinopril 20 mg daily. ? ?

## 2021-06-13 ENCOUNTER — Emergency Department: Payer: 59

## 2021-06-13 ENCOUNTER — Emergency Department
Admission: EM | Admit: 2021-06-13 | Discharge: 2021-06-13 | Disposition: A | Discharge status: Home / Self Care | Payer: 59 | Attending: Emergency Medicine | Admitting: Emergency Medicine

## 2021-06-13 ENCOUNTER — Other Ambulatory Visit: Payer: Self-pay | Admitting: Primary Care

## 2021-06-13 DIAGNOSIS — D72829 Elevated white blood cell count, unspecified: Secondary | ICD-10-CM | POA: Insufficient documentation

## 2021-06-13 DIAGNOSIS — R002 Palpitations: Secondary | ICD-10-CM | POA: Insufficient documentation

## 2021-06-13 DIAGNOSIS — E0590 Thyrotoxicosis, unspecified without thyrotoxic crisis or storm: Secondary | ICD-10-CM | POA: Insufficient documentation

## 2021-06-13 DIAGNOSIS — R000 Tachycardia, unspecified: Secondary | ICD-10-CM

## 2021-06-13 DIAGNOSIS — I10 Essential (primary) hypertension: Secondary | ICD-10-CM | POA: Diagnosis not present

## 2021-06-13 DIAGNOSIS — E785 Hyperlipidemia, unspecified: Secondary | ICD-10-CM

## 2021-06-13 DIAGNOSIS — F419 Anxiety disorder, unspecified: Secondary | ICD-10-CM | POA: Diagnosis not present

## 2021-06-13 DIAGNOSIS — I152 Hypertension secondary to endocrine disorders: Secondary | ICD-10-CM | POA: Insufficient documentation

## 2021-06-13 DIAGNOSIS — R69 Illness, unspecified: Secondary | ICD-10-CM | POA: Diagnosis not present

## 2021-06-13 LAB — CBC WITH DIFFERENTIAL/PLATELET
Abs Immature Granulocytes: 0.1 10*3/uL — ABNORMAL HIGH (ref 0.00–0.07)
Basophils Absolute: 0.1 10*3/uL (ref 0.0–0.1)
Basophils Relative: 1 %
Eosinophils Absolute: 0.1 10*3/uL (ref 0.0–0.5)
Eosinophils Relative: 1 %
HCT: 42 % (ref 36.0–46.0)
Hemoglobin: 14.2 g/dL (ref 12.0–15.0)
Immature Granulocytes: 1 %
Lymphocytes Relative: 26 %
Lymphs Abs: 2.9 10*3/uL (ref 0.7–4.0)
MCH: 29.7 pg (ref 26.0–34.0)
MCHC: 33.8 g/dL (ref 30.0–36.0)
MCV: 87.9 fL (ref 80.0–100.0)
Monocytes Absolute: 0.9 10*3/uL (ref 0.1–1.0)
Monocytes Relative: 8 %
Neutro Abs: 7.1 10*3/uL (ref 1.7–7.7)
Neutrophils Relative %: 63 %
Platelets: 448 10*3/uL — ABNORMAL HIGH (ref 150–400)
RBC: 4.78 MIL/uL (ref 3.87–5.11)
RDW: 12.9 % (ref 11.5–15.5)
WBC: 11.2 10*3/uL — ABNORMAL HIGH (ref 4.0–10.5)
nRBC: 0 % (ref 0.0–0.2)

## 2021-06-13 LAB — COMPREHENSIVE METABOLIC PANEL
ALT: 79 U/L — ABNORMAL HIGH (ref 0–44)
AST: 51 U/L — ABNORMAL HIGH (ref 15–41)
Albumin: 4.4 g/dL (ref 3.5–5.0)
Alkaline Phosphatase: 92 U/L (ref 38–126)
Anion gap: 12 (ref 5–15)
BUN: 11 mg/dL (ref 8–23)
CO2: 24 mmol/L (ref 22–32)
Calcium: 10.5 mg/dL — ABNORMAL HIGH (ref 8.9–10.3)
Chloride: 101 mmol/L (ref 98–111)
Creatinine, Ser: 0.82 mg/dL (ref 0.44–1.00)
GFR, Estimated: >60 mL/min (ref >60)
Glucose, Bld: 205 mg/dL — ABNORMAL HIGH (ref 70–99)
Potassium: 3.7 mmol/L (ref 3.5–5.1)
Sodium: 137 mmol/L (ref 135–145)
Total Bilirubin: 0.9 mg/dL (ref 0.3–1.2)
Total Protein: 7.6 g/dL (ref 6.5–8.1)

## 2021-06-13 LAB — TROPONIN I (HIGH SENSITIVITY): Troponin I (High Sensitivity): 8 ng/L (ref <18)

## 2021-06-13 LAB — T4, FREE: Free T4: 1.32 ng/dL — ABNORMAL HIGH (ref 0.61–1.12)

## 2021-06-13 LAB — BRAIN NATRIURETIC PEPTIDE: B Natriuretic Peptide: 41.8 pg/mL (ref 0.0–100.0)

## 2021-06-13 MED ORDER — ROSUVASTATIN CALCIUM 10 MG PO TABS: 10.0000 mg | ORAL_TABLET | Freq: Every day | ORAL | 3 refills | Status: DC

## 2021-06-13 MED ORDER — LORAZEPAM 2 MG/ML IJ SOLN
1.0000 mg | Freq: Once | INTRAMUSCULAR | Status: AC
  Administered 2021-06-13: 1 mg via INTRAVENOUS
  Filled 2021-06-13: qty 1

## 2021-06-13 NOTE — ED Notes (Signed)
Acuity changed due to vital signs once roomed.   ?

## 2021-06-13 NOTE — Discharge Instructions (Addendum)
Please do not take your levothyroxine tomorrow.  After tomorrow please take your 75 mcg levothyroxine 30 minutes prior to any food, drink, or other medications. ?

## 2021-06-13 NOTE — ED Triage Notes (Addendum)
Pt comes pov with HTN. Readings of 190s/100s at home. States headache and "feeling off" today. Had similar episode Monday. No clear weakness, tingling, numbness at this time. Pt states a lot of anxiety.  ?

## 2021-06-13 NOTE — ED Provider Notes (Signed)
? ?[facility] ?Provider Note ? ? Event Date/Time  ? First MD Initiated Contact with Patient 06/13/21 1817   ?  (approximate) ?History  ?Hypertension ? ?HPI ?Courtney Burns is a 61 y.o. female who presents for hypertension, tachycardia, and anxiety that began approximately 2 days prior to arrival.  Patient states that her blood pressure was as high as 200s/120s when she took it at home and was concerned that this would cause a stroke and therefore presented to the emergency department.  Patient endorses mild palpitations without any chest pain or shortness of breath.  Patient does endorse increased anxiety over the last few days and "feeling funny".  Patient denies any dyspnea on exertion, edema, nausea/vomiting, or weakness/numbness/paresthesias in any extremity ?Physical Exam  ?Triage Vital Signs: ?ED Triage Vitals  ?Enc Vitals Group  ?   BP 06/13/21 1806 (!) 222/115  ?   Pulse Rate 06/13/21 1806 (!) 146  ?   Resp 06/13/21 1806 18  ?   Temp 06/13/21 1806 98.5 ?F (36.9 ?C)  ?   Temp Source 06/13/21 1806 Oral  ?   SpO2 06/13/21 1806 95 %  ?   Weight 06/13/21 1807 202 lb 13.2 oz (92 kg)  ?   Height --   ?   Head Circumference --   ?   Peak Flow --   ?   Pain Score 06/13/21 1806 0  ?   Pain Loc --   ?   Pain Edu? --   ?   Excl. in [place]? --   ? ?Most recent vital signs: ?Vitals:  ? 06/13/21 1855 06/13/21 1900  ?BP: (!) 191/104 (!) 203/109  ?Pulse: (!) 127 (!) 134  ?Resp: 12 17  ?Temp:    ?SpO2:  95%  ? ?General: Awake, oriented x4. ?CV:  Good peripheral perfusion.  ?Resp:  Normal effort.  ?Abd:  No distention.  ?Other:  Overweight middle-aged Caucasian female laying in bed in no distress ?ED Results / Procedures / Treatments  ?Labs ?(all labs ordered are listed, but only abnormal results are displayed) ?Labs Reviewed  ?COMPREHENSIVE METABOLIC PANEL - Abnormal; Notable for the following components:  ?    Result Value  ? Glucose, Bld 205 (*)   ? Calcium 10.5 (*)   ? AST 51 (*)   ? ALT 79 (*)   ? All  other components within normal limits  ?CBC WITH DIFFERENTIAL/PLATELET - Abnormal; Notable for the following components:  ? WBC 11.2 (*)   ? Platelets 448 (*)   ? Abs Immature Granulocytes 0.10 (*)   ? All other components within normal limits  ?T4, FREE - Abnormal; Notable for the following components:  ? Free T4 1.32 (*)   ? All other components within normal limits  ?BRAIN NATRIURETIC PEPTIDE  ?URINALYSIS, ROUTINE W REFLEX MICROSCOPIC  ?URINE DRUG SCREEN, QUALITATIVE ([facility] ONLY)  ?TROPONIN I (HIGH SENSITIVITY)  ?TROPONIN I (HIGH SENSITIVITY)  ? ?EKG ?ED ECG REPORT ?I, Naaman Plummer, the attending physician, personally viewed and interpreted this ECG. ?Date: 06/13/2021 ?EKG Time: 1808 ?Rate: 140 ?Rhythm: Tachycardic sinus rhythm ?QRS Axis: normal ?Intervals: normal ?ST/T Wave abnormalities: normal ?Narrative Interpretation: Tachycardic sinus rhythm.  No evidence of acute ischemia ?RADIOLOGY ?ED MD interpretation: One-view portable chest x-ray interpreted by me shows no evidence of acute abnormalities including no pneumonia, pneumothorax, or widened mediastinum ?-Agree with radiology assessment ?Official radiology report(s): ?DG Chest Port 1 View ? ?Result Date: 06/13/2021 ?CLINICAL DATA:  Palpitations, hypertension, tachycardia. EXAM:  PORTABLE CHEST 1 VIEW COMPARISON:  None. FINDINGS: The heart size and mediastinal contours are within normal limits. Low lung volumes without evidence of focal consolidation or pleural effusion. Elevation of the right hemidiaphragm. No acute osseous abnormality. The visualized skeletal structures are unremarkable. IMPRESSION: Low lung volumes without evidence of focal consolidation or pleural effusion. Electronically Signed   By: Keane Police D.O.   On: 06/13/2021 18:39   ?PROCEDURES: ?Critical Care performed: No ?.1-3 Lead EKG Interpretation ?Performed by: Naaman Plummer, MD ?Authorized by: Naaman Plummer, MD  ? ?  Interpretation: normal   ?  ECG rate:  98 ?  ECG rate assessment:  normal   ?  Rhythm: sinus rhythm   ?  Ectopy: none   ?  Conduction: normal   ?MEDICATIONS ORDERED IN ED: ?Medications  ?LORazepam (ATIVAN) injection 1 mg (1 mg Intravenous Given 06/13/21 1823)  ? ?IMPRESSION / MDM / ASSESSMENT AND PLAN / ED COURSE  ?I reviewed the triage vital signs and the nursing notes. ?             ?               ?The patient is on the cardiac monitor to evaluate for evidence of arrhythmia and/or significant heart rate changes. ?Presents to the emergency department complaining of high blood pressure. ?Patient is otherwise asymptomatic without confusion, chest pain, hematuria, or SOB. ?Denies nonadherence to antihypertensive regimen ?DDx: CV, AMI, heart failure, renal infarction or failure or other end organ damage. ?Laboratory evaluation significant for elevated free T4 at 1.32.  Patient is also mild leukocytosis at 11.2.  Patient denies any fevers or open wounds/dysuria. ?I am concerned the patient's hypertension and tachycardia is likely secondary to hyperthyroidism secondary to exogenous levothyroxine use.  Patient states that she normally takes her medications in the morning all at once.  Patient was encouraged to take her thyroid medication at least 30 minutes prior to any other ingestion of food or other drugs.  Patient advised to skip tomorrow's dose of levothyroxine before resuming 1 day after as well as follow-up in 1 week with her primary physician for recheck of her thyroid level. ?Disposition: Discussed with patient their elevated blood pressure and need for close outpatient management of their hypertension. Will provide a prescription for the patient?s previous antihypertensive medication and arrange for the patient to follow up in a primary care clinic ? ?  ?FINAL CLINICAL IMPRESSION(S) / ED DIAGNOSES  ? ?Final diagnoses:  ?Hypertension due to endocrine disorder  ?Tachycardia  ?Hyperthyroidism without crisis  ? ?Rx / DC Orders  ? ?ED Discharge Orders   ? ? None  ? ?  ? ?Note:  This  document was prepared using Dragon voice recognition software and may include unintentional dictation errors. ?  ?Naaman Plummer, MD ?06/13/21 2044 ? ?

## 2021-06-14 ENCOUNTER — Encounter: Payer: Self-pay | Admitting: Primary Care

## 2021-06-14 ENCOUNTER — Ambulatory Visit: Payer: 59 | Admitting: Primary Care

## 2021-06-14 VITALS — BP 162/98 | HR 116 | Ht 64.0 in | Wt 201.0 lb

## 2021-06-14 DIAGNOSIS — I10 Essential (primary) hypertension: Secondary | ICD-10-CM | POA: Diagnosis not present

## 2021-06-14 DIAGNOSIS — E039 Hypothyroidism, unspecified: Secondary | ICD-10-CM | POA: Diagnosis not present

## 2021-06-14 DIAGNOSIS — K7581 Nonalcoholic steatohepatitis (NASH): Secondary | ICD-10-CM

## 2021-06-14 DIAGNOSIS — E559 Vitamin D deficiency, unspecified: Secondary | ICD-10-CM | POA: Diagnosis not present

## 2021-06-14 LAB — TSH: TSH: 2.36 mIU/L (ref 0.40–4.50)

## 2021-06-14 LAB — VITAMIN D 25 HYDROXY (VIT D DEFICIENCY, FRACTURES): Vit D, 25-Hydroxy: 26 ng/mL — ABNORMAL LOW (ref 30–100)

## 2021-06-14 MED ORDER — AMLODIPINE BESYLATE 5 MG PO TABS: 5.0000 mg | ORAL_TABLET | Freq: Every day | ORAL | 0 refills | Status: DC

## 2021-06-14 NOTE — Assessment & Plan Note (Addendum)
Uncontrolled. ?Significant family history of hypertension in both parents ? ?Continue lisinopril 10 mg daily. ?Add amlodipine 5 mg daily. ? ?ED notes, labs, imaging reviewed. ? ?Follow-up in 1 week. ?

## 2021-06-14 NOTE — Assessment & Plan Note (Signed)
Reviewed ED visit from last night, I am not convinced that her symptoms are secondary to too much levothyroxine. ? ?No TSH was drawn last night, we will draw this today. Free T4 is not proper testing for evaluation of someone already on T4 hormone. ? ?We discussed the proper way to take levothyroxine. ?Resume levothyroxine 75 mcg. ? ?Also checking PTH and vitamin D as calcium was noted to be elevated last night, and also because of tachycardia today.  Need to rule out hyperparathyroidism. ?

## 2021-06-14 NOTE — Progress Notes (Signed)
? ?Subjective:  ? ? Patient ID: Courtney Burns, female    DOB: [birth year], 61 y.o.   MRN: [id number] ? ?HPI ? ?Courtney Burns is a very pleasant 61 y.o. female with a history of hypertension, hypothyroidism, type 2 diabetes, hyperlipidemia who presents today for ED follow-up. ? ?She was evaluated 2 days ago in our clinic for follow-up.  Blood pressure was noted to be elevated at 148/96, she endorsed feeling anxious/nervous at the time. ? ?She presented to [facility] ED yesterday with reports of elevated home blood pressures as high as the 200s over 120s.  She also endorsed "feeling funny".  Her blood pressure in the ED upon check-in was 222/115 with a pulse rate of 146.  Labs revealed free T4 of 1.32, however TSH was not drawn.  Other labs including troponin, BNP were negative. Calcium was elevated. She was told that her symptoms were secondary to too much levothyroxine and to skip her dose today.  ? ?Today she has not taken her levothyroxine. She feels better. She denies headaches, chest pain, shortness of breath. She continues to feel anxious about what's going on. She has noticed an elevated heart rate over the last 24 hours.  She typically runs anywhere from 80 to 90s at home, but has been running in the 110's since yesterday. ? ?She is compliant to her lisinopril 20 mg daily. She has a significant family history of hypertension in both parents. Her father had a heart attack at age 12.  ? ?BP Readings from Last 3 Encounters:  ?06/14/21 (!) 162/98  ?06/13/21 (!) 165/94  ?06/12/21 (!) 152/102  ? ? ? ? ?Review of Systems  ?Respiratory:  Negative for shortness of breath.   ?Cardiovascular:  Negative for chest pain.  ?Neurological:  Negative for dizziness and headaches.  ?Psychiatric/Behavioral:  The patient is nervous/anxious.   ? ?   ? ? ?Past Medical History:  ?Diagnosis Date  ? Diabetes mellitus without complication ([place])   ? type 2  ? 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 GERD (gastroesophageal  reflux disease)   ? Hx - diet controlled,  no meds  ? History of chicken pox   ? History of Papanicolaou smear of cervix 08/2013  ? neg per pt  ? HSV infection   ? Hypertension   ? Hyperthyroidism   ? Hypothyroidism   ? had radiation then thyroid became low  ? Ileitis   ? Macular degeneration of left eye   ? Vitamin D deficiency   ? ? ?Social History  ? ?Socioeconomic History  ? Marital status: Married  ?  Spouse name: Not on file  ? Number of children: 0  ? Years of education: 69  ? Highest education level: Not on file  ?Occupational History  ? Occupation: Heritage manager  ?  Employer: [employer]  ?  Comment: [employer]  ?Tobacco Use  ? Smoking status: Never  ? Smokeless tobacco: Never  ?Vaping Use  ? Vaping Use: Never used  ?Substance and Sexual Activity  ? Alcohol use: No  ?  Alcohol/week: 0.0 standard drinks  ? Drug use: No  ? Sexual activity: Not Currently  ?  Partners: Male  ?  Birth control/protection: Post-menopausal  ?Other Topics Concern  ? Not on file  ?Social History Narrative  ? Lives in [place].   ? Works for [employer].  ? Courtney Burns grew up in [place]. She lives in [place] with her husband Courtney Burns) and their  3 cat ([name], Caroline, [place]).   ? She attended [employer] and obtained her Bachelors in [employer] with a minor in Youth worker.   ? She loves cooking and sewing.  ? ?Social Determinants of Health  ? ?Financial Resource Strain: Not on file  ?Food Insecurity: Not on file  ?Transportation Needs: Not on file  ?Physical Activity: Not on file  ?Stress: Not on file  ?Social Connections: Not on file  ?Intimate Partner Violence: Not on file  ? ? ?Past Surgical History:  ?Procedure Laterality Date  ? APPENDECTOMY  1970  ? BUNIONECTOMY Right 07/2001  ? right foot  ? CHOLECYSTECTOMY  2005  ? COLONOSCOPY  01/2004; 06/2015  ? Dr. Minette Headland, [place] - polyp; nl  ? [place]  ? left salpingectomy  ? RIGHT OOPHORECTOMY Right 03/2003   ? Abscessed cyst removed  ? WISDOM TOOTH EXTRACTION    ? ? ?Family History  ?Problem Relation Age of Onset  ? Hypertension Mother   ? Diabetes Mother   ? Cancer Mother 94  ?     GBM  ? Hyperlipidemia Father   ? Heart disease Father   ?     MI  ? Hypertension Father   ? Diabetes Father   ? Depression Father   ? COPD Father   ? Diabetes Maternal Aunt   ?     Uncontrolled with complications  ? Kidney disease Maternal Aunt   ?     Renal failure - dialysis  ? Cancer Maternal Uncle 60  ?     brain tumor  ? Stroke Maternal Grandmother   ? Liver cancer Maternal Grandfather   ? Cancer Maternal Grandfather 86  ?     lung  ? Heart disease Paternal Grandmother   ?     MI  ? Diabetes Paternal Grandmother   ? Prostate cancer Paternal Grandfather 74  ? Cancer - Prostate Paternal Grandfather   ? Colon cancer Neg Hx   ? Esophageal cancer Neg Hx   ? Rectal cancer Neg Hx   ? Stomach cancer Neg Hx   ? Breast cancer Neg Hx   ? ? ?Allergies  ?Allergen Reactions  ? Glipizide Itching  ? Sulfa Antibiotics Rash  ? ? ?Current Outpatient Medications on File Prior to Visit  ?Medication Sig Dispense Refill  ? brimonidine (ALPHAGAN) 0.2 % ophthalmic solution Place 1 drop into the left eye 2 (two) times daily.    ? clotrimazole-betamethasone (LOTRISONE) cream Apply externally BID for 2 wks 15 g 0  ? CONTOUR NEXT TEST test strip USE AS INSTRUCTED TO TEST BLOOD SUGAR UP TO 3 TIMES DAILY 100 strip 5  ? levothyroxine (SYNTHROID) 75 MCG tablet 1 TAB EVERY AM ON AN EMPTY STOMACH WITH WATER ONLY. NO FOOD OR OTHER MEDICATIONS FOR 30 MINUTES. 90 tablet 2  ? lisinopril (ZESTRIL) 20 MG tablet Take 1 tablet (20 mg total) by mouth daily. For blood pressure. 90 tablet 3  ? metFORMIN (GLUCOPHAGE) 1000 MG tablet Take 1 tablet (1,000 mg total) by mouth 2 (two) times daily with a meal. For diabetes. 180 tablet 1  ? 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 rosuvastatin (CRESTOR) 10 MG tablet Take 1 tablet (10 mg total) by mouth daily. for cholesterol. 90 tablet 3  ? Semaglutide,0.25 or 0.5MG/DOS, (OZEMPIC, 0.25 OR 0.5 MG/DOSE,) 2 MG/1.5ML  SOPN Inject 0.25 mg into the skin once weekly for 4 weeks, then increase to 0.5 mg once weekly thereafter for diabetes. 6 mL 0  ? valACYclovir (VALTREX) 500 MG tablet TAKE 1 TABLET BY MOUTH TWICE A DAY 6 tablet 0  ? Vitamin D, Ergocalciferol, (DRISDOL) 1.25 MG (50000 UNIT) CAPS capsule TAKE 1 CAPSULE BY MOUTH ONCE WEEKLY 12 capsule 0  ? ?No current facility-administered medications on file prior to visit.  ? ? ?BP (!) 162/98   Pulse (!) 116   Ht 5' 4"  (1.626 m)   Wt 201 lb (91.2 kg)   LMP 07/07/2013   SpO2 97%   BMI 34.50 kg/m?  ?Objective:  ? Physical Exam ?Cardiovascular:  ?   Rate and Rhythm: Normal rate and regular rhythm.  ?Pulmonary:  ?   Effort: Pulmonary effort is normal.  ?   Breath sounds: Normal breath sounds.  ?Musculoskeletal:  ?   Cervical back: Neck supple.  ?Skin: ?   General: Skin is warm and dry.  ?Psychiatric:  ?   Comments: Anxious during visit.  ? ? ? ? ? ?   ?Assessment & Plan:  ? ? ? ? ?This visit occurred during the SARS-CoV-2 public health emergency.  Safety protocols were in place, including screening questions prior to the visit, additional usage of staff PPE, and extensive cleaning of exam room while observing appropriate contact time as indicated for disinfecting solutions.  ?

## 2021-06-14 NOTE — Telephone Encounter (Signed)
Auth received by cover my meds called patient to let know change number busy  ? ?

## 2021-06-14 NOTE — Patient Instructions (Signed)
Stop by the lab prior to leaving today. I will notify you of your results once received.  ? ?Continue taking lisinopril 20 mg daily for blood pressure. ? ?Start taking amlodipine 5 mg daily for blood pressure. ? ?Resume your levothyroxine for now. Be sure to take your levothyroxine (thyroid medication) every morning on an empty stomach with water only.  ? ?No food or other medications for 30 minutes.  ? ?No heartburn medication, iron pills, calcium, vitamin D, or magnesium pills within four hours of taking levothyroxine.  ? ?Continue to monitor your blood pressure at home. ? ?Please schedule a follow up visit to meet back with me in 1 week for blood pressure check.  ? ?It was a pleasure to see you today! ? ? ?

## 2021-06-14 NOTE — Assessment & Plan Note (Signed)
Liver enzymes elevated again. ?She is also now on statin therapy. ? ?We will continue to monitor enzymes. ?Encouraged healthy diet regular exercise. ?

## 2021-06-15 ENCOUNTER — Encounter (INDEPENDENT_AMBULATORY_CARE_PROVIDER_SITE_OTHER): Payer: 59 | Admitting: Ophthalmology

## 2021-06-15 DIAGNOSIS — H43813 Vitreous degeneration, bilateral: Secondary | ICD-10-CM | POA: Diagnosis not present

## 2021-06-15 DIAGNOSIS — I10 Essential (primary) hypertension: Secondary | ICD-10-CM | POA: Diagnosis not present

## 2021-06-15 DIAGNOSIS — H353112 Nonexudative age-related macular degeneration, right eye, intermediate dry stage: Secondary | ICD-10-CM | POA: Diagnosis not present

## 2021-06-15 DIAGNOSIS — H353221 Exudative age-related macular degeneration, left eye, with active choroidal neovascularization: Secondary | ICD-10-CM | POA: Diagnosis not present

## 2021-06-15 DIAGNOSIS — H35033 Hypertensive retinopathy, bilateral: Secondary | ICD-10-CM

## 2021-06-15 DIAGNOSIS — H2513 Age-related nuclear cataract, bilateral: Secondary | ICD-10-CM

## 2021-06-16 ENCOUNTER — Telehealth: Payer: Self-pay

## 2021-06-16 NOTE — Telephone Encounter (Signed)
My chart sent to patient to let know approved  ? ?

## 2021-06-20 LAB — PTH, INTACT AND CALCIUM: Calcium: 9.7 mg/dL (ref 8.6–10.4)

## 2021-06-20 NOTE — Telephone Encounter (Signed)
Called pharmacy after talking with patient about another message. They did not have new 71m script for some reason but verbal has been given and request to remove 577mfrom profile. I have let patient know she should be able to pick up.  ?

## 2021-06-22 ENCOUNTER — Encounter: Payer: Self-pay | Admitting: Primary Care

## 2021-06-22 ENCOUNTER — Ambulatory Visit: Payer: 59 | Admitting: Primary Care

## 2021-06-22 DIAGNOSIS — I10 Essential (primary) hypertension: Secondary | ICD-10-CM

## 2021-06-22 NOTE — Patient Instructions (Signed)
Continue amlodipine 5 mg daily and lisinopril 10 mg daily for blood pressure. ? ?It was a pleasure to see you today! ? ?

## 2021-06-22 NOTE — Assessment & Plan Note (Signed)
Improved! ? ?Continue amlodipine 5 mg daily and lisinopril 10 mg daily.   ? ?Commended her on diet changes, encouraged to continue and also to add an exercise ? ?She will continue to monitor blood pressures at home. ? ?We will see her back this summer for follow-up. ?

## 2021-06-22 NOTE — Progress Notes (Signed)
? ?Subjective:  ? ? Patient ID: Courtney Burns, female    DOB: 1961-01-06, 61 y.o.   MRN: [id number] ? ?HPI ? ?Courtney Burns is a very pleasant 61 y.o. female with a history of hypertension, Courtney Burns, type 2 diabetes, hypothyroidism, hyperlipidemia who presents today for follow-up of hypertension. ? ?She was last evaluated on 06/14/2021 for ED follow-up of elevated blood pressure readings.  During this visit her blood pressure was uncontrolled so we continued her lisinopril 10 mg daily and added amlodipine 5 mg daily.  Labs were collected which were grossly negative. ? ?Since her last visit she has been checking her blood pressure at home.  Over the last week her blood pressure has been running 140's-160's/90's.  ? ?She began amlodipine 6 days ago.  She has continued her lisinopril. ? ?She's feeling much better. Her dizziness has improved. She's changed her diet significantly and is eating whole foods. She has eliminated processed foods. She denies ankle and foot edema.  ? ?BP Readings from Last 3 Encounters:  ?06/22/21 138/78  ?06/14/21 (!) 162/98  ?06/13/21 (!) 165/94  ? ? ? ?Review of Systems  ?Eyes:  Negative for visual disturbance.  ?Respiratory:  Negative for shortness of breath.   ?Cardiovascular:  Negative for chest pain.  ?Neurological:  Negative for dizziness and headaches.  ? ?   ? ? ?Past Medical History:  ?Diagnosis Date  ? Diabetes mellitus without complication ([place])   ? type 2  ? 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 GERD (gastroesophageal reflux disease)   ? Hx - diet controlled,  no meds  ? History of chicken pox   ? History of Papanicolaou smear of cervix 08/2013  ? neg per pt  ? HSV infection   ? Hypertension   ? Hyperthyroidism   ? Hypothyroidism   ? had radiation then thyroid became low  ? Ileitis   ? Macular degeneration of left eye   ? Vitamin D deficiency   ? ? ?Social History  ? ?Socioeconomic History  ? Marital status: Married  ?  Spouse name: Not on file  ? Number of children:  0  ? Years of education: 10  ? Highest education level: Not on file  ?Occupational History  ? Occupation: Heritage manager  ?  Employer: [employer]  ?  Comment: [employer]  ?Tobacco Use  ? Smoking status: Never  ? Smokeless tobacco: Never  ?Vaping Use  ? Vaping Use: Never used  ?Substance and Sexual Activity  ? Alcohol use: No  ?  Alcohol/week: 0.0 standard drinks  ? Drug use: No  ? Sexual activity: Not Currently  ?  Partners: Male  ?  Birth control/protection: Post-menopausal  ?Other Topics Concern  ? Not on file  ?Social History Narrative  ? Lives in [place].   ? Works for [employer].  ? Courtney Burns grew up in [place]. She lives in [place] with her husband Courtney Burns) and their 3 cat (Courtney Burns, Courtney Burns, Courtney Burns).   ? She attended [employer] and obtained her Bachelors in [employer] with a minor in Youth worker.   ? She loves cooking and sewing.  ? ?Social Determinants of Health  ? ?Financial Resource Strain: Not on file  ?Food Insecurity: Not on file  ?Transportation Needs: Not on file  ?Physical Activity: Not on file  ?Stress: Not on file  ?Social Connections: Not on file  ?Intimate Partner Violence: Not on file  ? ? ?Past Surgical  History:  ?Procedure Laterality Date  ? APPENDECTOMY  1970  ? BUNIONECTOMY Right 07/2001  ? right foot  ? CHOLECYSTECTOMY  2005  ? COLONOSCOPY  01/2004; 06/2015  ? Dr. Minette Headland, [place] - polyp; nl  ? [place]  ? left salpingectomy  ? RIGHT OOPHORECTOMY Right 03/2003  ? Abscessed cyst removed  ? WISDOM TOOTH EXTRACTION    ? ? ?Family History  ?Problem Relation Age of Onset  ? Hypertension Mother   ? Diabetes Mother   ? Cancer Mother 69  ?     GBM  ? Hyperlipidemia Father   ? Heart disease Father   ?     MI  ? Hypertension Father   ? Diabetes Father   ? Depression Father   ? COPD Father   ? Diabetes Maternal Aunt   ?     Uncontrolled with complications  ? Kidney disease Maternal Aunt   ?     Renal failure - dialysis  ?  Cancer Maternal Uncle 69  ?     brain tumor  ? Stroke Maternal Grandmother   ? Liver cancer Maternal Grandfather   ? Cancer Maternal Grandfather 42  ?     lung  ? Heart disease Paternal Grandmother   ?     MI  ? Diabetes Paternal Grandmother   ? Prostate cancer Paternal Grandfather 71  ? Cancer - Prostate Paternal Grandfather   ? Colon cancer Neg Hx   ? Esophageal cancer Neg Hx   ? Rectal cancer Neg Hx   ? Stomach cancer Neg Hx   ? Breast cancer Neg Hx   ? ? ?Allergies  ?Allergen Reactions  ? Glipizide Itching  ? Sulfa Antibiotics Rash  ? ? ?Current Outpatient Medications on File Prior to Visit  ?Medication Sig Dispense Refill  ? amLODipine (NORVASC) 5 MG tablet Take 1 tablet (5 mg total) by mouth daily. for blood pressure. 30 tablet 0  ? brimonidine (ALPHAGAN) 0.2 % ophthalmic solution Place 1 drop into the left eye 2 (two) times daily.    ? CONTOUR NEXT TEST test strip USE AS INSTRUCTED TO TEST BLOOD SUGAR UP TO 3 TIMES DAILY 100 strip 5  ? levothyroxine (SYNTHROID) 75 MCG tablet 1 TAB EVERY AM ON AN EMPTY STOMACH WITH WATER ONLY. NO FOOD OR OTHER MEDICATIONS FOR 30 MINUTES. 90 tablet 2  ? lisinopril (ZESTRIL) 20 MG tablet Take 1 tablet (20 mg total) by mouth daily. For blood pressure. 90 tablet 3  ? metFORMIN (GLUCOPHAGE) 1000 MG tablet Take 1 tablet (1,000 mg total) by mouth 2 (two) times daily with a meal. For diabetes. 180 tablet 1  ? PRESCRIPTION MEDICATION Eye injection for wet macular degeneration in her left eye monthly for life, given at [place] in [facility]    ? rosuvastatin (CRESTOR) 10 MG tablet Take 1 tablet (10 mg total) by mouth daily. for cholesterol. 90 tablet 3  ? Semaglutide,0.25 or 0.5MG/DOS, (OZEMPIC, 0.25 OR 0.5 MG/DOSE,) 2 MG/1.5ML SOPN Inject 0.25 mg into the skin once weekly for 4 weeks, then increase to 0.5 mg once weekly thereafter for diabetes. 6 mL 0  ? valACYclovir (VALTREX) 500 MG tablet TAKE 1 TABLET BY MOUTH TWICE A DAY 6 tablet 0  ? ?No current  facility-administered medications on file prior to visit.  ? ? ?BP 138/78   Pulse 86   Temp 98.6 ?F (37 ?C) (Oral)   LMP 07/07/2013   SpO2 98%  ?Objective:  ?  Physical Exam ?Cardiovascular:  ?   Rate and Rhythm: Normal rate and regular rhythm.  ?Pulmonary:  ?   Effort: Pulmonary effort is normal.  ?   Breath sounds: Normal breath sounds.  ?Musculoskeletal:  ?   Cervical back: Neck supple.  ?Skin: ?   General: Skin is warm and dry.  ? ? ? ? ? ?   ?Assessment & Plan:  ? ? ? ? ?This visit occurred during the SARS-CoV-2 public health emergency.  Safety protocols were in place, including screening questions prior to the visit, additional usage of staff PPE, and extensive cleaning of exam room while observing appropriate contact time as indicated for disinfecting solutions.  ?

## 2021-06-27 ENCOUNTER — Other Ambulatory Visit: Payer: Self-pay | Admitting: Primary Care

## 2021-06-27 DIAGNOSIS — E119 Type 2 diabetes mellitus without complications: Secondary | ICD-10-CM

## 2021-07-04 ENCOUNTER — Telehealth: Payer: Self-pay

## 2021-07-04 NOTE — Telephone Encounter (Signed)
Called and lvm for patient to call us back regarding medication  ?

## 2021-07-04 NOTE — Telephone Encounter (Signed)
-----   Message from Pleas Koch, NP sent at 06/13/2021 11:58 AM EDT ----- ?She wasn't prescribed insulin. ? ?She was initially prescribed Mounjaro, but I received notification to switch from [facility] to [place]. The doses are different. For Ozempic, she will inject 0.25 mg once weekly x 4 weeks, then increase to 0.5 mg weekly thereafter. ? ?Can we find out what's going on? ? ?Rx for rosuvastatin 10 sent to pharmacy. ?

## 2021-07-07 ENCOUNTER — Other Ambulatory Visit: Payer: Self-pay | Admitting: Primary Care

## 2021-07-07 DIAGNOSIS — I10 Essential (primary) hypertension: Secondary | ICD-10-CM

## 2021-07-10 ENCOUNTER — Other Ambulatory Visit: Payer: Self-pay | Admitting: Primary Care

## 2021-07-10 DIAGNOSIS — E119 Type 2 diabetes mellitus without complications: Secondary | ICD-10-CM

## 2021-07-18 ENCOUNTER — Encounter (INDEPENDENT_AMBULATORY_CARE_PROVIDER_SITE_OTHER): Payer: 59 | Admitting: Ophthalmology

## 2021-07-18 DIAGNOSIS — I10 Essential (primary) hypertension: Secondary | ICD-10-CM

## 2021-07-18 DIAGNOSIS — H35033 Hypertensive retinopathy, bilateral: Secondary | ICD-10-CM | POA: Diagnosis not present

## 2021-07-18 DIAGNOSIS — H353221 Exudative age-related macular degeneration, left eye, with active choroidal neovascularization: Secondary | ICD-10-CM

## 2021-07-18 DIAGNOSIS — H43813 Vitreous degeneration, bilateral: Secondary | ICD-10-CM

## 2021-07-18 DIAGNOSIS — H353112 Nonexudative age-related macular degeneration, right eye, intermediate dry stage: Secondary | ICD-10-CM

## 2021-07-20 ENCOUNTER — Encounter (INDEPENDENT_AMBULATORY_CARE_PROVIDER_SITE_OTHER): Payer: 59 | Admitting: Ophthalmology

## 2021-08-23 ENCOUNTER — Encounter (INDEPENDENT_AMBULATORY_CARE_PROVIDER_SITE_OTHER): Payer: 59 | Admitting: Ophthalmology

## 2021-08-27 ENCOUNTER — Other Ambulatory Visit: Payer: Self-pay | Admitting: Primary Care

## 2021-08-27 DIAGNOSIS — E1165 Type 2 diabetes mellitus with hyperglycemia: Secondary | ICD-10-CM

## 2021-08-29 ENCOUNTER — Encounter (INDEPENDENT_AMBULATORY_CARE_PROVIDER_SITE_OTHER): Payer: 59 | Admitting: Ophthalmology

## 2021-08-29 DIAGNOSIS — H35033 Hypertensive retinopathy, bilateral: Secondary | ICD-10-CM | POA: Diagnosis not present

## 2021-08-29 DIAGNOSIS — H43813 Vitreous degeneration, bilateral: Secondary | ICD-10-CM | POA: Diagnosis not present

## 2021-08-29 DIAGNOSIS — H353221 Exudative age-related macular degeneration, left eye, with active choroidal neovascularization: Secondary | ICD-10-CM | POA: Diagnosis not present

## 2021-08-29 DIAGNOSIS — I10 Essential (primary) hypertension: Secondary | ICD-10-CM | POA: Diagnosis not present

## 2021-08-29 DIAGNOSIS — H353112 Nonexudative age-related macular degeneration, right eye, intermediate dry stage: Secondary | ICD-10-CM | POA: Diagnosis not present

## 2021-09-13 ENCOUNTER — Encounter: Payer: Self-pay | Admitting: Primary Care

## 2021-09-13 ENCOUNTER — Ambulatory Visit: Payer: 59 | Admitting: Primary Care

## 2021-09-13 VITALS — BP 132/74 | HR 76 | Temp 98.6°F | Ht 64.0 in | Wt 194.0 lb

## 2021-09-13 DIAGNOSIS — E559 Vitamin D deficiency, unspecified: Secondary | ICD-10-CM

## 2021-09-13 DIAGNOSIS — E1165 Type 2 diabetes mellitus with hyperglycemia: Secondary | ICD-10-CM | POA: Diagnosis not present

## 2021-09-13 LAB — POCT GLYCOSYLATED HEMOGLOBIN (HGB A1C): Hemoglobin A1C: 6.9 % — AB (ref 4.0–5.6)

## 2021-09-13 LAB — VITAMIN D 25 HYDROXY (VIT D DEFICIENCY, FRACTURES): VITD: 32.74 ng/mL (ref 30.00–100.00)

## 2021-09-13 NOTE — Progress Notes (Signed)
Subjective:    Patient ID: Courtney Burns, female    DOB: January 24, 1961, 61 y.o.   MRN: [id number]  Diabetes Pertinent negatives for hypoglycemia include no headaches. Pertinent negatives for diabetes include no chest pain.    Courtney Burns is a very pleasant 61 y.o. female with a history of type 2 diabetes, hypothyroidism, nonalcoholic steatohepatitis, hyperlipidemia, hypertension who presents today for follow-up of diabetes.  Current medications include: Semaglutide 0.5 mg weekly, metformin 1000 mg twice daily.  She is checking her blood glucose 3 times weekly and is getting readings of 130's-150's  Last A1C: 7.9 in April 2023, 6.9 today Last Eye Exam: Up-to-date Last Foot Exam: Up-to-date Pneumonia Vaccination: 2018 Urine Microalbumin: None.  Managed on ACE inhibitor Statin: Rosuvastatin  Dietary changes since last visit: Increased fresh fruits and veggies, lean protein.   Exercise: Regular walking.    BP Readings from Last 3 Encounters:  09/13/21 132/74  06/22/21 138/78  06/14/21 (!) 162/98   Wt Readings from Last 3 Encounters:  09/13/21 194 lb (88 kg)  06/14/21 201 lb (91.2 kg)  06/13/21 202 lb 13.2 oz (92 kg)      Review of Systems  Respiratory:  Negative for shortness of breath.   Cardiovascular:  Negative for chest pain.  Gastrointestinal:  Negative for constipation and diarrhea.  Neurological:  Negative for numbness and headaches.         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    GERD (gastroesophageal reflux disease)    Hx - diet controlled,  no meds   History of chicken pox    History of Papanicolaou smear of cervix 08/2013   neg per pt   HSV infection    Hypertension    Hyperthyroidism    Hypothyroidism    had radiation then thyroid became low   Ileitis    Macular degeneration of left eye    Vitamin D deficiency     Social  History   Socioeconomic History   Marital status: Married    Spouse name: Not on file   Number of children: 0   Years of education: 16   Highest education level: Not on file  Occupational History   Occupation: Lobbyist: [employer]    Comment: Software engineer  Tobacco Use   Smoking status: Never   Smokeless tobacco: Never  Scientific laboratory technician Use: Never used  Substance and Sexual Activity   Alcohol use: No    Alcohol/week: 0.0 standard drinks of alcohol   Drug use: No   Sexual activity: Not Currently    Partners: Male    Birth control/protection: Post-menopausal  Other Topics Concern   Not on file  Social History Narrative   Lives in [place].    Works for [employer].   Courtney Burns grew up in [place]. She lives in [place] with her husband Courtney Burns) and their 3 cat ([name], [place], [place]).    She attended [employer] and obtained her Bachelors in [employer] with a minor in Youth worker.    She loves cooking and sewing.   Social Determinants of Health   Financial Resource Strain: Not on file  Food Insecurity: Not on file  Transportation Needs: Not on file  Physical Activity: Not on file  Stress: Not on file  Social Connections: Not on file  Intimate Partner Violence: Not on file    Past Surgical History:  Procedure Laterality Date   APPENDECTOMY  1970   BUNIONECTOMY Right 07/2001   right foot   CHOLECYSTECTOMY  2005   COLONOSCOPY  01/2004; 06/2015   Dr. Minette Headland, [place] - polyp; nl   [place]   left salpingectomy   RIGHT OOPHORECTOMY Right 03/2003   Abscessed cyst removed   WISDOM TOOTH EXTRACTION      Family History  Problem Relation Age of Onset   Hypertension Mother    Diabetes Mother    Cancer Mother 46       GBM   Hyperlipidemia Father    Heart disease Father        MI   Hypertension Father    Diabetes Father    Depression Father    COPD Father     Diabetes Maternal Aunt        Uncontrolled with complications   Kidney disease Maternal Aunt        Renal failure - dialysis   Cancer Maternal Uncle 38       brain tumor   Stroke Maternal Grandmother    Liver cancer Maternal Grandfather    Cancer Maternal Grandfather 100       lung   Heart disease Paternal Grandmother        MI   Diabetes Paternal Grandmother    Prostate cancer Paternal Grandfather 89   Cancer - Prostate Paternal Grandfather    Colon cancer Neg Hx    Esophageal cancer Neg Hx    Rectal cancer Neg Hx    Stomach cancer Neg Hx    Breast cancer Neg Hx     Allergies  Allergen Reactions   Glipizide Itching   Sulfa Antibiotics Rash    Current Outpatient Medications on File Prior to Visit  Medication Sig Dispense Refill   amLODipine (NORVASC) 5 MG tablet TAKE 1 TABLET BY MOUTH EVERY DAY FOR BLOOD PRESSURE 90 tablet 1   brimonidine (ALPHAGAN) 0.2 % ophthalmic solution Place 1 drop into the left eye 2 (two) times daily.     CONTOUR NEXT TEST test strip USE AS INSTRUCTED TO TEST BLOOD SUGAR UP TO 3 TIMES DAILY 100 strip 5   levothyroxine (SYNTHROID) 75 MCG tablet 1 TAB EVERY AM ON AN EMPTY STOMACH WITH WATER ONLY. NO FOOD OR OTHER MEDICATIONS FOR 30 MINUTES. 90 tablet 2   lisinopril (ZESTRIL) 20 MG tablet Take 1 tablet (20 mg total) by mouth daily. For blood pressure. 90 tablet 3   metFORMIN (GLUCOPHAGE) 1000 MG tablet TAKE 1 TABLET (1,000 MG TOTAL) BY MOUTH 2 (TWO) TIMES DAILY WITH A MEAL. FOR DIABETES. 180 tablet 1   PRESCRIPTION MEDICATION Eye injection for wet macular degeneration in her left eye monthly for life, given at [place] in [facility]     rosuvastatin (CRESTOR) 10 MG tablet Take 1 tablet (10 mg total) by mouth daily. for cholesterol. 90 tablet 3   Semaglutide,0.25 or 0.5MG/DOS, (OZEMPIC, 0.25 OR 0.5 MG/DOSE,) 2 MG/3ML SOPN Inject 0.5 mg into the skin once a week. for diabetes. 9 mL 0   valACYclovir (VALTREX) 500 MG tablet TAKE 1 TABLET BY MOUTH  TWICE A DAY 6 tablet 0   No current facility-administered medications on file prior to visit.    BP 132/74   Pulse 76   Temp 98.6 F (37 C) (Oral)   Ht 5' 4"  (1.626 m)   Wt 194 lb (  88 kg)   LMP 07/07/2013   SpO2 98%   BMI 33.30 kg/m  Objective:   Physical Exam Cardiovascular:     Rate and Rhythm: Normal rate and regular rhythm.  Pulmonary:     Effort: Pulmonary effort is normal.     Breath sounds: Normal breath sounds.  Musculoskeletal:     Cervical back: Neck supple.  Skin:    General: Skin is warm and dry.           Assessment & Plan:   Problem List Items Addressed This Visit       Endocrine   Type 2 diabetes mellitus ([place]) - Primary    Improved and controlled with A1C of 6.9!  Continue Ozempic 0.5 mg weekly, metformin 1000 mg BID.  Foot and eye exams UTD. Pneumonia vaccine UTD.  Managed on statin and ACE-I.  Follow up in 6 months.      Relevant Orders   POCT glycosylated hemoglobin (Hb A1C) (Completed)     Other   Vitamin D deficiency    Continue vitamin D 2000 IU daily.  Repeat vitamin D level pending.      Relevant Orders   VITAMIN D 25 Hydroxy (Vit-D Deficiency, Fractures)       Pleas Koch, NP

## 2021-09-13 NOTE — Assessment & Plan Note (Signed)
Continue vitamin D 2000 IU daily.  Repeat vitamin D level pending.

## 2021-09-13 NOTE — Assessment & Plan Note (Signed)
Improved and controlled with A1C of 6.9!  Continue Ozempic 0.5 mg weekly, metformin 1000 mg BID.  Foot and eye exams UTD. Pneumonia vaccine UTD.  Managed on statin and ACE-I.  Follow up in 6 months.

## 2021-09-13 NOTE — Patient Instructions (Addendum)
Stop by the lab prior to leaving today. I will notify you of your results once received.   Please schedule a physical to meet with me in October.    It was a pleasure to see you today!

## 2021-10-03 ENCOUNTER — Encounter (INDEPENDENT_AMBULATORY_CARE_PROVIDER_SITE_OTHER): Payer: 59 | Admitting: Ophthalmology

## 2021-10-05 ENCOUNTER — Encounter (INDEPENDENT_AMBULATORY_CARE_PROVIDER_SITE_OTHER): Payer: 59 | Admitting: Ophthalmology

## 2021-10-05 DIAGNOSIS — H353221 Exudative age-related macular degeneration, left eye, with active choroidal neovascularization: Secondary | ICD-10-CM

## 2021-10-05 DIAGNOSIS — H35033 Hypertensive retinopathy, bilateral: Secondary | ICD-10-CM

## 2021-10-05 DIAGNOSIS — I10 Essential (primary) hypertension: Secondary | ICD-10-CM | POA: Diagnosis not present

## 2021-10-05 DIAGNOSIS — H43813 Vitreous degeneration, bilateral: Secondary | ICD-10-CM

## 2021-10-05 DIAGNOSIS — H353112 Nonexudative age-related macular degeneration, right eye, intermediate dry stage: Secondary | ICD-10-CM

## 2021-11-07 ENCOUNTER — Encounter (INDEPENDENT_AMBULATORY_CARE_PROVIDER_SITE_OTHER): Payer: 59 | Admitting: Ophthalmology

## 2021-11-07 DIAGNOSIS — H353112 Nonexudative age-related macular degeneration, right eye, intermediate dry stage: Secondary | ICD-10-CM

## 2021-11-07 DIAGNOSIS — I10 Essential (primary) hypertension: Secondary | ICD-10-CM

## 2021-11-07 DIAGNOSIS — H35033 Hypertensive retinopathy, bilateral: Secondary | ICD-10-CM | POA: Diagnosis not present

## 2021-11-07 DIAGNOSIS — H353221 Exudative age-related macular degeneration, left eye, with active choroidal neovascularization: Secondary | ICD-10-CM

## 2021-11-07 DIAGNOSIS — H43813 Vitreous degeneration, bilateral: Secondary | ICD-10-CM

## 2021-11-17 ENCOUNTER — Other Ambulatory Visit: Payer: Self-pay | Admitting: Primary Care

## 2021-11-17 DIAGNOSIS — E1165 Type 2 diabetes mellitus with hyperglycemia: Secondary | ICD-10-CM

## 2021-12-05 ENCOUNTER — Encounter (INDEPENDENT_AMBULATORY_CARE_PROVIDER_SITE_OTHER): Payer: 59 | Admitting: Ophthalmology

## 2021-12-05 DIAGNOSIS — H43813 Vitreous degeneration, bilateral: Secondary | ICD-10-CM

## 2021-12-05 DIAGNOSIS — H35033 Hypertensive retinopathy, bilateral: Secondary | ICD-10-CM

## 2021-12-05 DIAGNOSIS — H353112 Nonexudative age-related macular degeneration, right eye, intermediate dry stage: Secondary | ICD-10-CM

## 2021-12-05 DIAGNOSIS — H353221 Exudative age-related macular degeneration, left eye, with active choroidal neovascularization: Secondary | ICD-10-CM | POA: Diagnosis not present

## 2021-12-05 DIAGNOSIS — I10 Essential (primary) hypertension: Secondary | ICD-10-CM

## 2022-01-02 ENCOUNTER — Encounter (INDEPENDENT_AMBULATORY_CARE_PROVIDER_SITE_OTHER): Payer: 59 | Admitting: Ophthalmology

## 2022-01-02 DIAGNOSIS — H43813 Vitreous degeneration, bilateral: Secondary | ICD-10-CM | POA: Diagnosis not present

## 2022-01-02 DIAGNOSIS — H353221 Exudative age-related macular degeneration, left eye, with active choroidal neovascularization: Secondary | ICD-10-CM | POA: Diagnosis not present

## 2022-01-02 DIAGNOSIS — H353112 Nonexudative age-related macular degeneration, right eye, intermediate dry stage: Secondary | ICD-10-CM | POA: Diagnosis not present

## 2022-01-02 DIAGNOSIS — I10 Essential (primary) hypertension: Secondary | ICD-10-CM | POA: Diagnosis not present

## 2022-01-02 DIAGNOSIS — H35033 Hypertensive retinopathy, bilateral: Secondary | ICD-10-CM | POA: Diagnosis not present

## 2022-01-03 ENCOUNTER — Telehealth: Payer: Self-pay | Admitting: Primary Care

## 2022-01-03 DIAGNOSIS — E119 Type 2 diabetes mellitus without complications: Secondary | ICD-10-CM

## 2022-01-03 DIAGNOSIS — I10 Essential (primary) hypertension: Secondary | ICD-10-CM

## 2022-01-03 NOTE — Telephone Encounter (Signed)
[email or web address]

## 2022-01-03 NOTE — Telephone Encounter (Signed)
Patient is due for CPE/follow up in January 2024, this will be required prior to any further refills.  Please schedule.

## 2022-01-10 ENCOUNTER — Encounter: Payer: Self-pay | Admitting: Primary Care

## 2022-01-10 ENCOUNTER — Ambulatory Visit (INDEPENDENT_AMBULATORY_CARE_PROVIDER_SITE_OTHER): Payer: 59 | Admitting: Primary Care

## 2022-01-10 VITALS — BP 146/82 | HR 103 | Temp 98.1°F | Ht 64.0 in | Wt 189.0 lb

## 2022-01-10 DIAGNOSIS — Z1231 Encounter for screening mammogram for malignant neoplasm of breast: Secondary | ICD-10-CM

## 2022-01-10 DIAGNOSIS — A6000 Herpesviral infection of urogenital system, unspecified: Secondary | ICD-10-CM | POA: Diagnosis not present

## 2022-01-10 DIAGNOSIS — E785 Hyperlipidemia, unspecified: Secondary | ICD-10-CM | POA: Diagnosis not present

## 2022-01-10 DIAGNOSIS — Z0000 Encounter for general adult medical examination without abnormal findings: Secondary | ICD-10-CM | POA: Diagnosis not present

## 2022-01-10 DIAGNOSIS — I10 Essential (primary) hypertension: Secondary | ICD-10-CM | POA: Diagnosis not present

## 2022-01-10 DIAGNOSIS — E039 Hypothyroidism, unspecified: Secondary | ICD-10-CM

## 2022-01-10 DIAGNOSIS — E559 Vitamin D deficiency, unspecified: Secondary | ICD-10-CM | POA: Diagnosis not present

## 2022-01-10 DIAGNOSIS — E1165 Type 2 diabetes mellitus with hyperglycemia: Secondary | ICD-10-CM

## 2022-01-10 DIAGNOSIS — Z23 Encounter for immunization: Secondary | ICD-10-CM | POA: Diagnosis not present

## 2022-01-10 DIAGNOSIS — K7581 Nonalcoholic steatohepatitis (NASH): Secondary | ICD-10-CM | POA: Diagnosis not present

## 2022-01-10 DIAGNOSIS — H3530 Unspecified macular degeneration: Secondary | ICD-10-CM | POA: Diagnosis not present

## 2022-01-10 LAB — LIPID PANEL
Cholesterol: 96 mg/dL (ref 0–200)
HDL: 33.7 mg/dL — ABNORMAL LOW (ref >39.00)
LDL Cholesterol: 24 mg/dL (ref 0–99)
NonHDL: 62.22
Total CHOL/HDL Ratio: 3
Triglycerides: 191 mg/dL — ABNORMAL HIGH (ref 0.0–149.0)
VLDL: 38.2 mg/dL (ref 0.0–40.0)

## 2022-01-10 LAB — COMPREHENSIVE METABOLIC PANEL
ALT: 37 U/L — ABNORMAL HIGH (ref 0–35)
AST: 28 U/L (ref 0–37)
Albumin: 4.4 g/dL (ref 3.5–5.2)
Alkaline Phosphatase: 81 U/L (ref 39–117)
BUN: 13 mg/dL (ref 6–23)
CO2: 26 mEq/L (ref 19–32)
Calcium: 9.5 mg/dL (ref 8.4–10.5)
Chloride: 103 mEq/L (ref 96–112)
Creatinine, Ser: 0.68 mg/dL (ref 0.40–1.20)
GFR: 93.19 mL/min (ref >60.00)
Glucose, Bld: 129 mg/dL — ABNORMAL HIGH (ref 70–99)
Potassium: 4.5 mEq/L (ref 3.5–5.1)
Sodium: 140 mEq/L (ref 135–145)
Total Bilirubin: 0.4 mg/dL (ref 0.2–1.2)
Total Protein: 6.4 g/dL (ref 6.0–8.3)

## 2022-01-10 LAB — MICROALBUMIN / CREATININE URINE RATIO
Creatinine,U: 104.7 mg/dL
Microalb Creat Ratio: 1.1 mg/g (ref 0.0–30.0)
Microalb, Ur: 1.1 mg/dL (ref 0.0–1.9)

## 2022-01-10 LAB — TSH: TSH: 1.37 u[IU]/mL (ref 0.35–5.50)

## 2022-01-10 LAB — VITAMIN D 25 HYDROXY (VIT D DEFICIENCY, FRACTURES): VITD: 36.83 ng/mL (ref 30.00–100.00)

## 2022-01-10 LAB — HEMOGLOBIN A1C: Hgb A1c MFr Bld: 7.4 % — ABNORMAL HIGH (ref 4.6–6.5)

## 2022-01-10 NOTE — Assessment & Plan Note (Signed)
Shingrix and influenza vaccines due and provided today. Colonoscopy UTD, due 2027 Pap smear UTD. Mammogram due, orders placed.  Discussed the importance of a healthy diet and regular exercise in order for weight loss, and to reduce the risk of further co-morbidity.  Exam stable. Labs pending.  Follow up in 1 year for repeat physical.

## 2022-01-10 NOTE — Assessment & Plan Note (Signed)
Infrequent episodes. Continue Valtrex 500 mg BID PRN.

## 2022-01-10 NOTE — Assessment & Plan Note (Signed)
Repeat CMP pending.

## 2022-01-10 NOTE — Patient Instructions (Signed)
Stop by the lab prior to leaving today. I will notify you of your results once received.   [facility] to schedule your mamm[facility].   Please schedule a follow up visit for 6 months for a diabetes check.  It was a pleasure to see you today!  Preventive Care 40-62 Years Old, Female Preventive care refers to lifestyle choices and visits with your health care provider that can promote health and wellness. Preventive care visits are also called wellness exams. What can I expect for my preventive care visit? Counseling Your health care provider may ask you questions about your: Medical history, including: Past medical problems. Family medical history. Pregnancy history. Current health, including: Menstrual cycle. Method of birth control. Emotional well-being. Home life and relationship well-being. Sexual activity and sexual health. Lifestyle, including: Alcohol, nicotine or tobacco, and drug use. Access to firearms. Diet, exercise, and sleep habits. Work and work environment. Sunscreen use. Safety issues such as seatbelt and bike helmet use. Physical exam Your health care provider will check your: Height and weight. These may be used to calculate your BMI (body mass index). BMI is a measurement that tells if you are at a healthy weight. Waist circumference. This measures the distance around your waistline. This measurement also tells if you are at a healthy weight and may help predict your risk of certain diseases, such as type 2 diabetes and high blood pressure. Heart rate and blood pressure. Body temperature. Skin for abnormal spots. What immunizations do I need?  Vaccines are usually given at various ages, according to a schedule. Your health care provider will recommend vaccines for you based on your age, medical history, and lifestyle or other factors, such as travel or where you work. What tests do I need? Screening Your health care provider may recommend screening  tests for certain conditions. This may include: Lipid and cholesterol levels. Diabetes screening. This is done by checking your blood sugar (glucose) after you have not eaten for a while (fasting). Pelvic exam and Pap test. Hepatitis B test. Hepatitis C test. HIV (human immunodeficiency virus) test. STI (sexually transmitted infection) testing, if you are at risk. Lung cancer screening. Colorectal cancer screening. Mammogram. Talk with your health care provider about when you should start having regular mammograms. This may depend on whether you have a family history of breast cancer. BRCA-related cancer screening. This may be done if you have a family history of breast, ovarian, tubal, or peritoneal cancers. Bone density scan. This is done to screen for osteoporosis. Talk with your health care provider about your test results, treatment options, and if necessary, the need for more tests. Follow these instructions at home: Eating and drinking  Eat a diet that includes fresh fruits and vegetables, whole grains, lean protein, and low-fat dairy products. Take vitamin and mineral supplements as recommended by your health care provider. Do not drink alcohol if: Your health care provider tells you not to drink. You are pregnant, may be pregnant, or are planning to become pregnant. If you drink alcohol: Limit how much you have to 0-1 drink a day. Know how much alcohol is in your drink. In the [place] one drink equals one 12 oz bottle of beer (355 mL), one 5 oz glass of wine (148 mL), or one 1 oz glass of hard liquor (44 mL). Lifestyle Brush your teeth every morning and night with fluoride toothpaste. Floss one time each day. Exercise for at least 30 minutes 5 or more days each week. Do   not use any products that contain nicotine or tobacco. These products include cigarettes, chewing tobacco, and vaping devices, such as e-cigarettes. If you need help quitting, ask your health care provider. Do not  use drugs. If you are sexually active, practice safe sex. Use a condom or other form of protection to prevent STIs. If you do not wish to become pregnant, use a form of birth control. If you plan to become pregnant, see your health care provider for a prepregnancy visit. Take aspirin only as told by your health care provider. Make sure that you understand how much to take and what form to take. Work with your health care provider to find out whether it is safe and beneficial for you to take aspirin daily. Find healthy ways to manage stress, such as: Meditation, yoga, or listening to music. Journaling. Talking to a trusted person. Spending time with friends and family. Minimize exposure to UV radiation to reduce your risk of skin cancer. Safety Always wear your seat belt while driving or riding in a vehicle. Do not drive: If you have been drinking alcohol. Do not ride with someone who has been drinking. When you are tired or distracted. While texting. If you have been using any mind-altering substances or drugs. Wear a helmet and other protective equipment during sports activities. If you have firearms in your house, make sure you follow all gun safety procedures. Seek help if you have been physically or sexually abused. What's next? Visit your health care provider once a year for an annual wellness visit. Ask your health care provider how often you should have your eyes and teeth checked. Stay up to date on all vaccines. This information is not intended to replace advice given to you by your health care provider. Make sure you discuss any questions you have with your health care provider. Document Revised: 08/23/2020 Document Reviewed: 08/23/2020 Elsevier Patient Education  2023 Elsevier Inc.  

## 2022-01-10 NOTE — Progress Notes (Signed)
Subjective:    Patient ID: Courtney Burns, female    DOB: 03-02-1961, 61 y.o.   MRN: [id number]  HPI  Courtney Burns is a very pleasant 61 y.o. female who presents today for complete physical and follow up of chronic conditions.  Immunizations: -Tetanus: 2018 -Influenza: Due -Shingles: Never completed  -Pneumonia: 2018  Diet: Fair diet.  Exercise: Walking 2 miles four days weekly.  Eye exam: Completes annually  Dental exam: Completes semi-annually   Pap Smear: Completed in 2022 Mammogram: Completed in July 2022  Colonoscopy: Completed in 2017, due 2027   BP Readings from Last 3 Encounters:  01/10/22 (!) 146/82  09/13/21 132/74  06/22/21 138/78   Wt Readings from Last 3 Encounters:  01/10/22 189 lb (85.7 kg)  09/13/21 194 lb (88 kg)  06/14/21 201 lb (91.2 kg)       Review of Systems  Constitutional:  Negative for unexpected weight change.  HENT:  Negative for rhinorrhea.   Respiratory:  Negative for cough and shortness of breath.   Cardiovascular:  Negative for chest pain.  Gastrointestinal:  Negative for constipation and diarrhea.  Genitourinary:  Negative for difficulty urinating.  Musculoskeletal:  Negative for arthralgias.  Skin:  Negative for rash.  Allergic/Immunologic: Negative for environmental allergies.  Neurological:  Negative for dizziness, numbness and headaches.  Psychiatric/Behavioral:  The patient is not nervous/anxious.          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    GERD (gastroesophageal reflux disease)    Hx - diet controlled,  no meds   History of chicken pox    History of Papanicolaou smear of cervix 08/2013   neg per pt   HSV infection    Hypertension    Hyperthyroidism    Hypothyroidism    had radiation then thyroid became low   Ileitis    Macular degeneration  of left eye    Vitamin D deficiency     Social History   Socioeconomic History   Marital status: Married    Spouse name: Not on file   Number of children: 0   Years of education: 16   Highest education level: Not on file  Occupational History   Occupation: Lobbyist: [employer]    Comment: Software engineer  Tobacco Use   Smoking status: Never   Smokeless tobacco: Never  Scientific laboratory technician Use: Never used  Substance and Sexual Activity   Alcohol use: No    Alcohol/week: 0.0 standard drinks of alcohol   Drug use: No   Sexual activity: Not Currently    Partners: Male    Birth control/protection: Post-menopausal  Other Topics Concern   Not on file  Social History Narrative   Lives in [place].    Works for [employer].   [name] grew up in [place]. She lives in [place] with her husband [name]) and their 3 cat ([name], [place], [place]).    She attended [employer] and obtained her Bachelors in [employer] with a minor in Youth worker.    She loves cooking and sewing.   Social Determinants of Health   Financial Resource Strain: Not on file  Food Insecurity: Not on file  Transportation Needs: Not on file  Physical Activity: Not on file  Stress: Not  on file  Social Connections: Not on file  Intimate Partner Violence: Not on file    Past Surgical History:  Procedure Laterality Date   APPENDECTOMY  1970   BUNIONECTOMY Right 07/2001   right foot   CHOLECYSTECTOMY  2005   COLONOSCOPY  01/2004; 06/2015   Dr. Minette Headland, [place] - polyp; nl   [place]   left salpingectomy   RIGHT OOPHORECTOMY Right 03/2003   Abscessed cyst removed   WISDOM TOOTH EXTRACTION      Family History  Problem Relation Age of Onset   Hypertension Mother    Diabetes Mother    Cancer Mother 86       GBM   Hyperlipidemia Father    Heart disease Father        MI   Hypertension Father    Diabetes  Father    Depression Father    COPD Father    Diabetes Maternal Aunt        Uncontrolled with complications   Kidney disease Maternal Aunt        Renal failure - dialysis   Cancer Maternal Uncle 68       brain tumor   Stroke Maternal Grandmother    Liver cancer Maternal Grandfather    Cancer Maternal Grandfather 59       lung   Heart disease Paternal Grandmother        MI   Diabetes Paternal Grandmother    Prostate cancer Paternal Grandfather 20   Cancer - Prostate Paternal Grandfather    Colon cancer Neg Hx    Esophageal cancer Neg Hx    Rectal cancer Neg Hx    Stomach cancer Neg Hx    Breast cancer Neg Hx     Allergies  Allergen Reactions   Glipizide Itching   Sulfa Antibiotics Rash    Current Outpatient Medications on File Prior to Visit  Medication Sig Dispense Refill   amLODipine (NORVASC) 5 MG tablet TAKE 1 TABLET BY MOUTH EVERY DAY FOR BLOOD PRESSURE 90 tablet 0   brimonidine (ALPHAGAN) 0.2 % ophthalmic solution Place 1 drop into the left eye 2 (two) times daily.     CONTOUR NEXT TEST test strip USE AS INSTRUCTED TO TEST BLOOD SUGAR UP TO 3 TIMES DAILY 100 strip 5   levothyroxine (SYNTHROID) 75 MCG tablet 1 TAB EVERY AM ON AN EMPTY STOMACH WITH WATER ONLY. NO FOOD OR OTHER MEDICATIONS FOR 30 MINUTES. 90 tablet 2   lisinopril (ZESTRIL) 20 MG tablet Take 1 tablet (20 mg total) by mouth daily. For blood pressure. 90 tablet 3   metFORMIN (GLUCOPHAGE) 1000 MG tablet TAKE 1 TABLET (1,000 MG TOTAL) BY MOUTH 2 (TWO) TIMES DAILY WITH A MEAL. FOR DIABETES. 180 tablet 0   PRESCRIPTION MEDICATION Eye injection for wet macular degeneration in her left eye monthly for life, given at [place] in [facility]     rosuvastatin (CRESTOR) 10 MG tablet Take 1 tablet (10 mg total) by mouth daily. for cholesterol. 90 tablet 3   Semaglutide,0.25 or 0.5MG/DOS, (OZEMPIC, 0.25 OR 0.5 MG/DOSE,) 2 MG/3ML SOPN INJECT 0.5 MG INTO THE SKIN ONCE A WEEK. FOR DIABETES. 9 mL 1    valACYclovir (VALTREX) 500 MG tablet TAKE 1 TABLET BY MOUTH TWICE A DAY 6 tablet 0   No current facility-administered medications on file prior to visit.    BP (!) 146/82   Pulse (!) 103   Temp 98.1 F (36.7 C) (Temporal)  Ht 5' 4"  (1.626 m)   Wt 189 lb (85.7 kg)   LMP 07/07/2013   SpO2 99%   BMI 32.44 kg/m  Objective:   Physical Exam HENT:     Right Ear: Tympanic membrane and ear canal normal.     Left Ear: Tympanic membrane and ear canal normal.     Nose: Nose normal.  Eyes:     Conjunctiva/sclera: Conjunctivae normal.     Pupils: Pupils are equal, round, and reactive to light.  Neck:     Thyroid: No thyromegaly.  Cardiovascular:     Rate and Rhythm: Normal rate and regular rhythm.     Heart sounds: No murmur heard. Pulmonary:     Effort: Pulmonary effort is normal.     Breath sounds: Normal breath sounds. No rales.  Abdominal:     General: Bowel sounds are normal.     Palpations: Abdomen is soft.     Tenderness: There is no abdominal tenderness.  Musculoskeletal:        General: Normal range of motion.     Cervical back: Neck supple.  Lymphadenopathy:     Cervical: No cervical adenopathy.  Skin:    General: Skin is warm and dry.     Findings: No rash.  Neurological:     Mental Status: She is alert and oriented to person, place, and time.     Cranial Nerves: No cranial nerve deficit.     Deep Tendon Reflexes: Reflexes are normal and symmetric.  Psychiatric:        Mood and Affect: Mood normal.           Assessment & Plan:   Problem List Items Addressed This Visit       Cardiovascular and Mediastinum   Essential hypertension    Above goal, improved readings at home and other places.  Continue amlodipine 5 mg daily, lisinopril 20 mg daily. CMP pending.        Digestive   NASH (nonalcoholic steatohepatitis)    Repeat CMP pending.        Endocrine   Hypothyroidism    She is taking her eye vitamin 30 min after levothyroxine. Discussed  proper instructions for administration.  Continue levothyroxine 75 mcg daily. Repeat A1C pending.         Relevant Orders   TSH   Type 2 diabetes mellitus (HCC)    Repeat A1C pending.  Continue metformin 1000 mg BID, Ozempic 0.5 mg weekly. Foot exam today. Managed on statin. Urine microalbumin due and pending.  Follow up in 6 months.      Relevant Orders   Microalbumin/Creatinine Ratio, Urine   Hemoglobin A1c     Genitourinary   Genital herpes    Infrequent episodes. Continue Valtrex 500 mg BID PRN.        Other   Annual physical exam - Primary    Shingrix and influenza vaccines due and provided today. Colonoscopy UTD, due 2027 Pap smear UTD. Mammogram due, orders placed.  Discussed the importance of a healthy diet and regular exercise in order for weight loss, and to reduce the risk of further co-morbidity.  Exam stable. Labs pending.  Follow up in 1 year for repeat physical.       HLD (hyperlipidemia)    Continue rosuvastatin 5 mg daily. Repeat lipid panel pending      Relevant Orders   Lipid panel   Comprehensive metabolic panel   Vitamin D deficiency    Continue vitamin D 3000 IU.  Repeat vitamin D level pending.      Relevant Orders   VITAMIN D 25 Hydroxy (Vit-D Deficiency, Fractures)   Macular degeneration    Following with ophthalmology.  Continue eye injections every 7 weeks.  Continue Alphagan drops BID.      Other Visit Diagnoses     Encounter for screening mammogram for malignant neoplasm of breast       Relevant Orders   MM 3D SCREEN BREAST BILATERAL          Pleas Koch, NP

## 2022-01-10 NOTE — Assessment & Plan Note (Signed)
Repeat A1C pending.  Continue metformin 1000 mg BID, Ozempic 0.5 mg weekly. Foot exam today. Managed on statin. Urine microalbumin due and pending.  Follow up in 6 months.

## 2022-01-10 NOTE — Assessment & Plan Note (Signed)
She is taking her eye vitamin 30 min after levothyroxine. Discussed proper instructions for administration.  Continue levothyroxine 75 mcg daily. Repeat A1C pending.

## 2022-01-10 NOTE — Assessment & Plan Note (Signed)
Above goal, improved readings at home and other places.  Continue amlodipine 5 mg daily, lisinopril 20 mg daily. CMP pending.

## 2022-01-10 NOTE — Assessment & Plan Note (Signed)
Continue vitamin D 3000 IU. Repeat vitamin D level pending.

## 2022-01-10 NOTE — Addendum Note (Signed)
Addended by: Pat Kocher on: 01/10/2022 08:24 AM   Modules accepted: Orders

## 2022-01-10 NOTE — Assessment & Plan Note (Signed)
Following with ophthalmology.  Continue eye injections every 7 weeks.  Continue Alphagan drops BID.

## 2022-01-10 NOTE — Assessment & Plan Note (Signed)
Continue rosuvastatin 5 mg daily. Repeat lipid panel pending

## 2022-01-18 ENCOUNTER — Other Ambulatory Visit: Payer: Self-pay | Admitting: Primary Care

## 2022-01-18 DIAGNOSIS — E039 Hypothyroidism, unspecified: Secondary | ICD-10-CM

## 2022-01-21 ENCOUNTER — Other Ambulatory Visit: Payer: Self-pay | Admitting: Primary Care

## 2022-01-21 DIAGNOSIS — I10 Essential (primary) hypertension: Secondary | ICD-10-CM

## 2022-01-22 DIAGNOSIS — E1165 Type 2 diabetes mellitus with hyperglycemia: Secondary | ICD-10-CM

## 2022-01-22 MED ORDER — SEMAGLUTIDE (1 MG/DOSE) 4 MG/3ML ~~LOC~~ SOPN: 1.0000 mg | PEN_INJECTOR | SUBCUTANEOUS | 1 refills | Status: DC

## 2022-02-21 ENCOUNTER — Encounter (INDEPENDENT_AMBULATORY_CARE_PROVIDER_SITE_OTHER): Payer: 59 | Admitting: Ophthalmology

## 2022-02-21 DIAGNOSIS — H353221 Exudative age-related macular degeneration, left eye, with active choroidal neovascularization: Secondary | ICD-10-CM

## 2022-02-21 DIAGNOSIS — H353112 Nonexudative age-related macular degeneration, right eye, intermediate dry stage: Secondary | ICD-10-CM | POA: Diagnosis not present

## 2022-02-21 DIAGNOSIS — H43813 Vitreous degeneration, bilateral: Secondary | ICD-10-CM

## 2022-02-21 DIAGNOSIS — I10 Essential (primary) hypertension: Secondary | ICD-10-CM

## 2022-02-21 DIAGNOSIS — H35033 Hypertensive retinopathy, bilateral: Secondary | ICD-10-CM

## 2022-02-23 IMAGING — MG MM DIGITAL SCREENING BILAT W/ TOMO AND CAD
8 series · 8 of 24 positions shown · non-contrast
Comparison: Previous exam(s).

CLINICAL DATA: Screening.

EXAM:
DIGITAL SCREENING BILATERAL MAMMOGRAM WITH TOMOSYNTHESIS AND CAD
TECHNIQUE: Bilateral screening digital craniocaudal and mediolateral oblique
mammograms were obtained. Bilateral screening digital breast
tomosynthesis was performed. The images were evaluated with
computer-aided detection.

[L CC synth-2D]
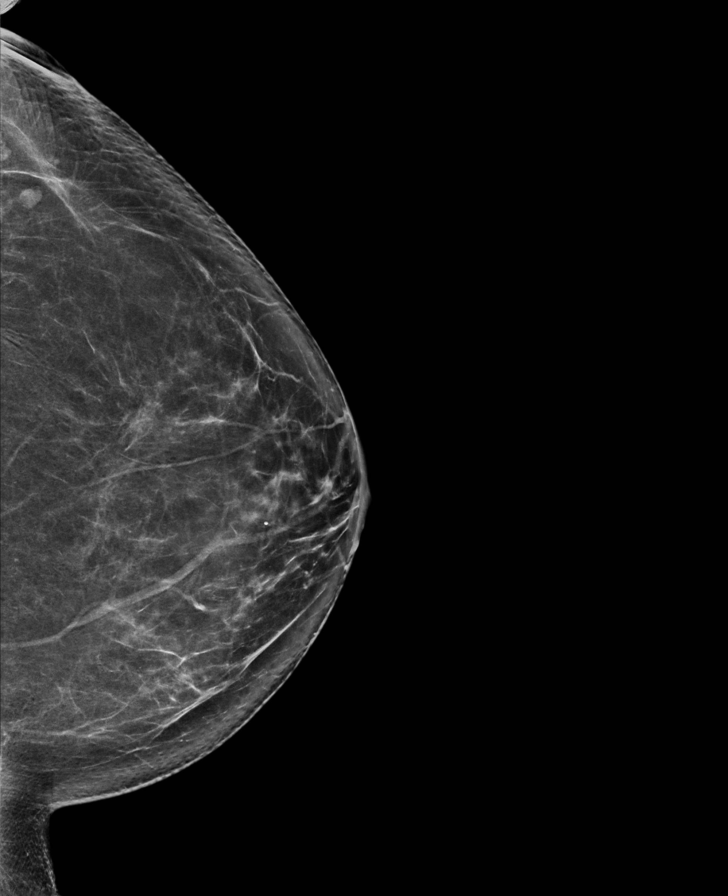

[R CC synth-2D]
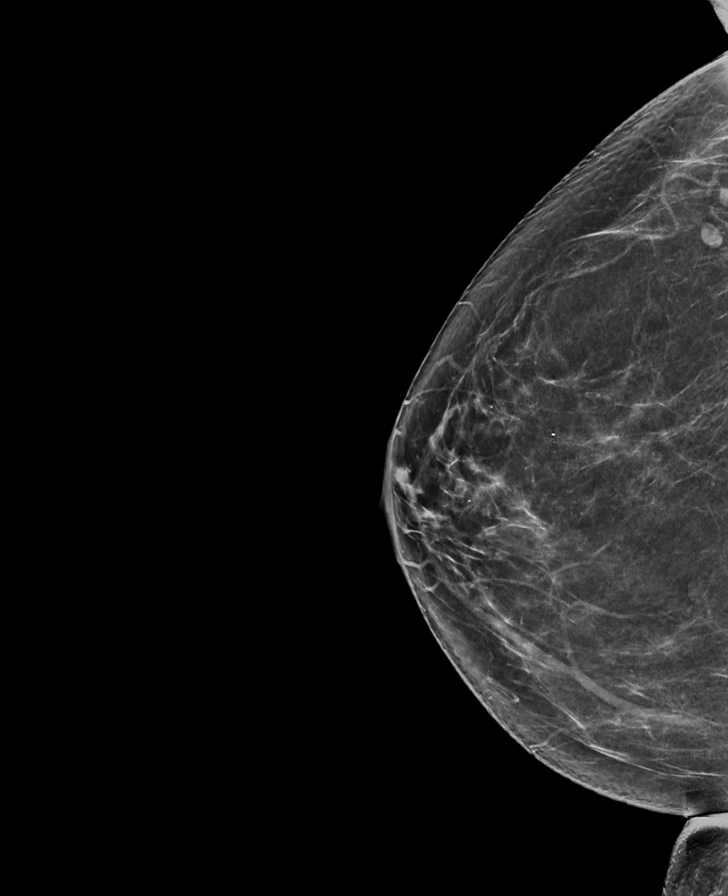

[R MLO synth-2D]
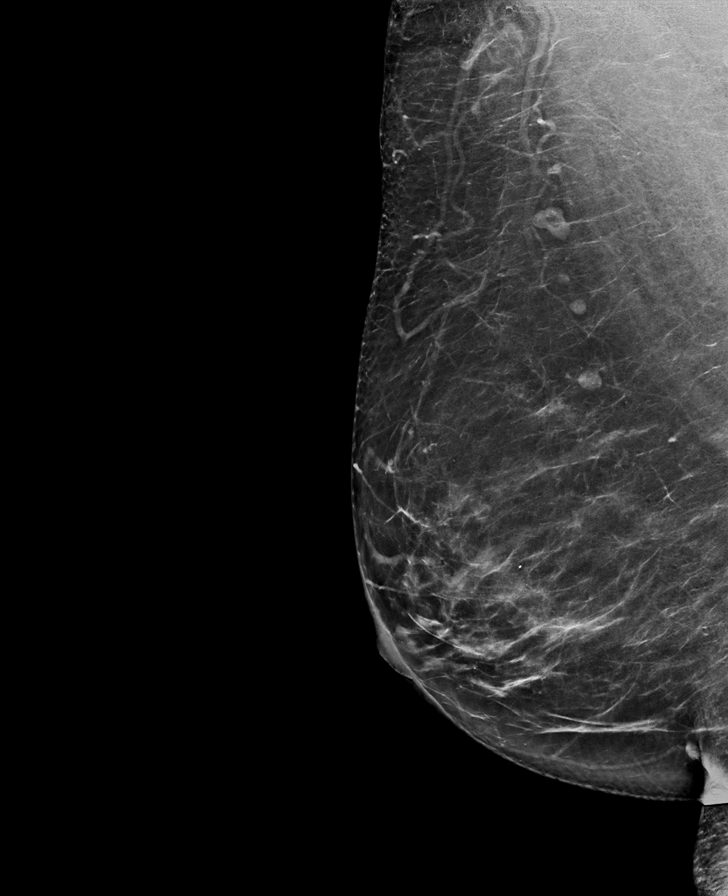

[L MLO synth-2D]
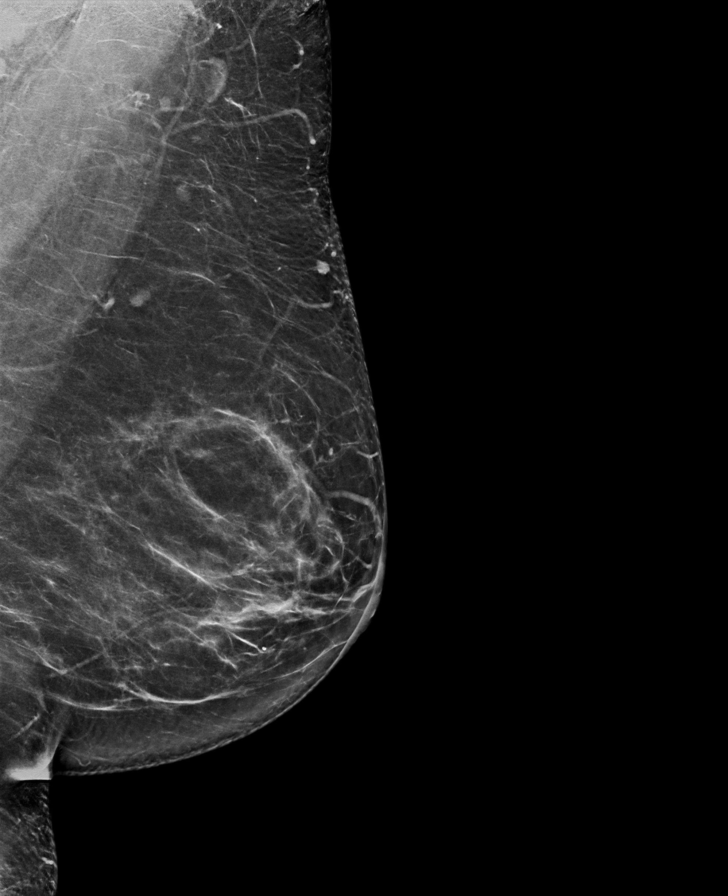

[L CC tomo · tomo slice 40/79.0]
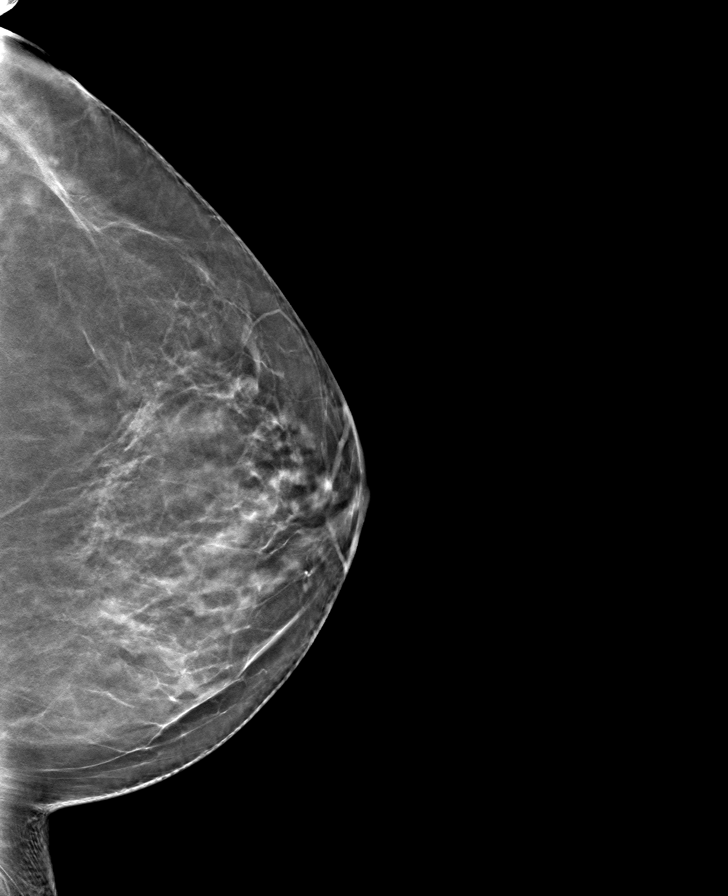

[L MLO tomo · tomo slice 42/83.0]
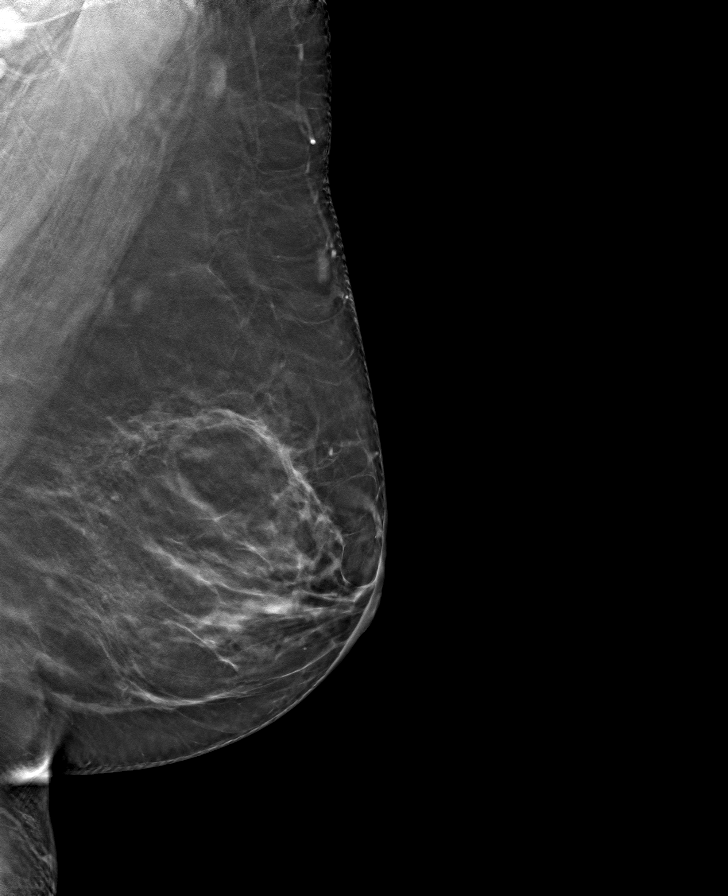

[R CC tomo · tomo slice 39/76.0]
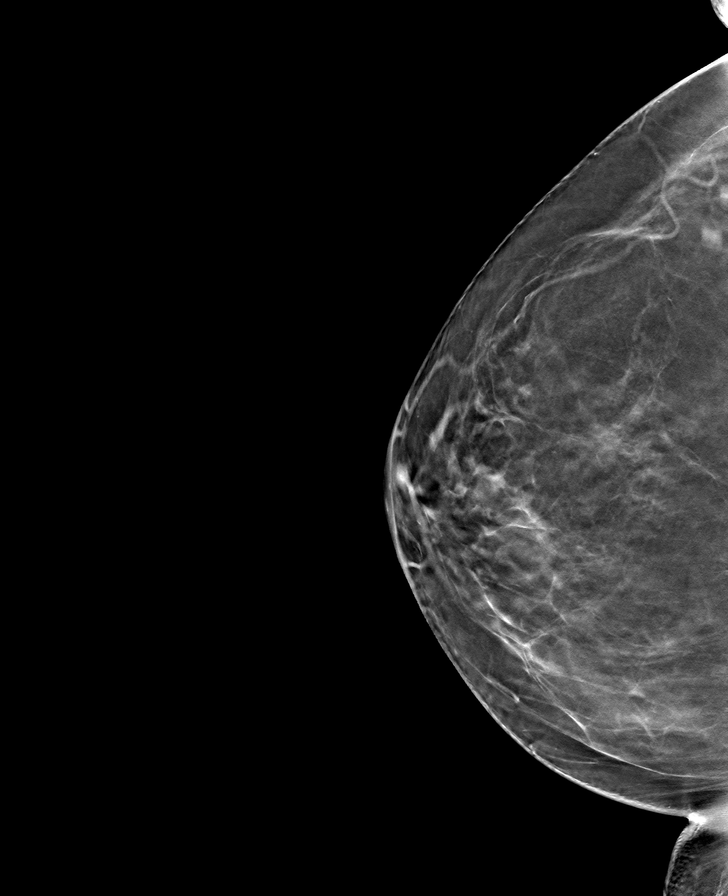

[R MLO tomo · tomo slice 41/82.0]
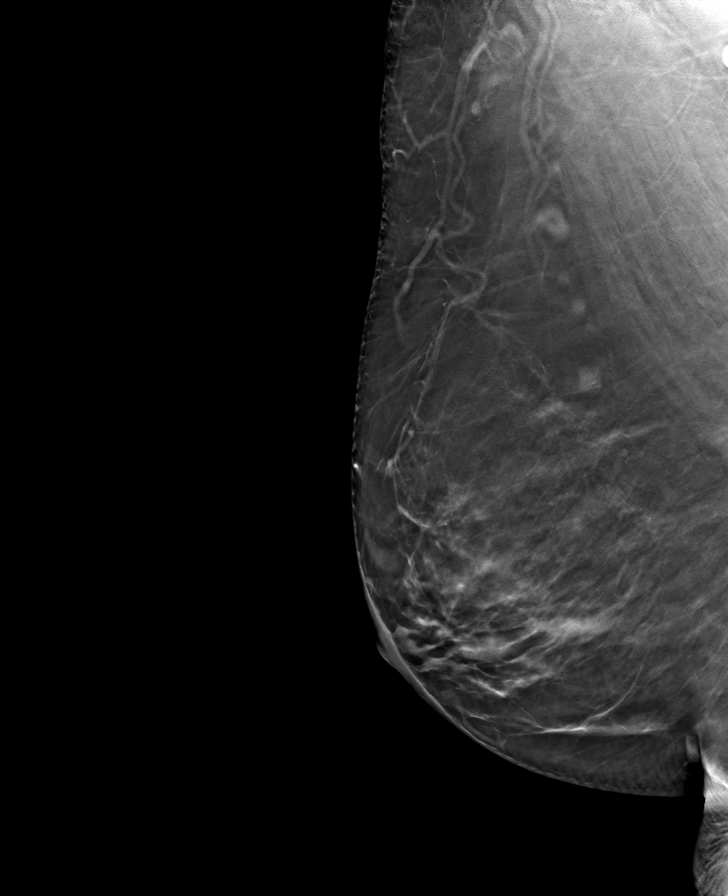

[8 of 24 positions shown; findings below may reference images not displayed]

ACR Breast Density Category b: There are scattered areas of
fibroglandular density.
FINDINGS: There are no findings suspicious for malignancy.
IMPRESSION: No mammographic evidence of malignancy. A result letter of this
screening mammogram will be mailed directly to the patient.

RECOMMENDATION:
Screening mammogram in one year. (Code:51-O-LD2)

BI-RADS CATEGORY  1: Negative.

## 2022-03-08 DIAGNOSIS — J029 Acute pharyngitis, unspecified: Secondary | ICD-10-CM | POA: Diagnosis not present

## 2022-03-08 DIAGNOSIS — I10 Essential (primary) hypertension: Secondary | ICD-10-CM | POA: Diagnosis not present

## 2022-03-08 DIAGNOSIS — J309 Allergic rhinitis, unspecified: Secondary | ICD-10-CM | POA: Diagnosis not present

## 2022-03-12 ENCOUNTER — Ambulatory Visit: Payer: 59 | Admitting: Family Medicine

## 2022-03-12 ENCOUNTER — Encounter: Payer: Self-pay | Admitting: Family Medicine

## 2022-03-12 VITALS — BP 142/72 | HR 142 | Temp 99.2°F | Ht 64.0 in | Wt 186.0 lb

## 2022-03-12 DIAGNOSIS — R000 Tachycardia, unspecified: Secondary | ICD-10-CM

## 2022-03-12 DIAGNOSIS — J0100 Acute maxillary sinusitis, unspecified: Secondary | ICD-10-CM

## 2022-03-12 MED ORDER — AMOXICILLIN 500 MG PO CAPS: 1000.0000 mg | ORAL_CAPSULE | Freq: Two times a day (BID) | ORAL | 0 refills | Status: DC

## 2022-03-12 MED ORDER — FLUCONAZOLE 150 MG PO TABS: 150.0000 mg | ORAL_TABLET | Freq: Once | ORAL | 0 refills | Status: AC

## 2022-03-12 NOTE — Patient Instructions (Addendum)
Complete antibiotics.  Use ibuprofen for sore throat.  Push water intake.  Avoid decongestants and  caffeine.  Follow pulse sat home.. if npot improving in 24 hours call for further evaluation.

## 2022-03-12 NOTE — Assessment & Plan Note (Signed)
Possibly secondary to mild dehydration, caffeine and decongestant. Use ibuprofen for sore throat.  Push water intake.  Avoid decongestants and  caffeine.  Follow pulse sat home.. if npot improving in 24 hours call for further evaluation.

## 2022-03-12 NOTE — Progress Notes (Signed)
Patient ID: Courtney Burns, female    DOB: 09-10-1960, 62 y.o.   MRN: [id number]  This visit was conducted in person.  BP (!) 142/72   Pulse (!) 142   Temp 99.2 F (37.3 C) (Oral)   Ht 5\' 4"  (1.626 m)   Wt 186 lb (84.4 kg)   LMP 07/07/2013   SpO2 97%   BMI 31.93 kg/m   CC:  Chief Complaint  Patient presents with   Sore Throat    X1week; tested for Flu, Strep and COVID - all NEG   Fever    101 last night   Ear Pain    Left; denies drainage   Eye Problem    Right, clear discharge, denies itching/pain    Subjective:   HPI: Courtney Burns is a 61 y.o. female presenting on 03/12/2022 for Sore Throat (X1week; tested for Flu, Strep and COVID - all NEG), Fever (101 last night), Ear Pain (Left; denies drainage), and Eye Problem (Right, clear discharge, denies itching/pain)  History of DM type 2, HTN  1 week of symptoms with worsening ST, fatigue. Left throat  pain worse than right.  Decreased appetite.  Symptoms progressed to left ear pain.  Now fever 101 F last night  Bilateral face pain and pressure, sinus headache on right.  Minimal cough.  Nasal congestion.. bloody mucus.   No SOB.  Using claritin .Marland Kitchen Did not help.  Using tylenol for pain advil cold and sinus  She has not been eating and drinking today given sore throat.  32 oz tea, no food today.     Relevant past medical, surgical, family and social history reviewed and updated as indicated. Interim medical history since our last visit reviewed. Allergies and medications reviewed and updated. Outpatient Medications Prior to Visit  Medication Sig Dispense Refill   amLODipine (NORVASC) 5 MG tablet TAKE 1 TABLET BY MOUTH EVERY DAY FOR BLOOD PRESSURE 90 tablet 0   brimonidine (ALPHAGAN) 0.2 % ophthalmic solution Place 1 drop into the left eye 2 (two) times daily.     CONTOUR NEXT TEST test strip USE AS INSTRUCTED TO TEST BLOOD SUGAR UP TO 3 TIMES DAILY 100 strip 5   levothyroxine (SYNTHROID) 75 MCG tablet TAKE 1  TAB EVERY AM ON EMPTY STOMACH W/WATER ONLY. NO FOOD OR OTHER MEDICATIONS FOR 30 MINUTES. 90 tablet 3   lisinopril (ZESTRIL) 20 MG tablet TAKE 1 TABLET (20 MG TOTAL) BY MOUTH DAILY. FOR BLOOD PRESSURE. 90 tablet 3   metFORMIN (GLUCOPHAGE) 1000 MG tablet TAKE 1 TABLET (1,000 MG TOTAL) BY MOUTH 2 (TWO) TIMES DAILY WITH A MEAL. FOR DIABETES. 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 for cholesterol. 90 tablet 3   Semaglutide, 1 MG/DOSE, 4 MG/3ML SOPN Inject 1 mg as directed once a week. for diabetes. 9 mL 1   valACYclovir (VALTREX) 500 MG tablet TAKE 1 TABLET BY MOUTH TWICE A DAY 6 tablet 0   No facility-administered medications prior to visit.     Per HPI unless specifically indicated in ROS section below Review of Systems Objective:  BP (!) 142/72   Pulse (!) 142   Temp 99.2 F (37.3 C) (Oral)   Ht 5\' 4"  (1.626 m)   Wt 186 lb (84.4 kg)  LMP 07/07/2013   SpO2 97%   BMI 31.93 kg/m   Wt Readings from Last 3 Encounters:  03/12/22 186 lb (84.4 kg)  01/10/22 189 lb (85.7 kg)  09/13/21 194 lb (88 kg)      Physical Exam    Results for orders placed or performed in visit on 01/10/22  Microalbumin/Creatinine Ratio, Urine  Result Value Ref Range   Microalb, Ur 1.1 0.0 - 1.9 mg/dL   Creatinine,U 104.7 mg/dL   Microalb Creat Ratio 1.1 0.0 - 30.0 mg/g  Lipid panel  Result Value Ref Range   Cholesterol 96 0 - 200 mg/dL   Triglycerides 191.0 (H) 0.0 - 149.0 mg/dL   HDL 33.70 (L) >39.00 mg/dL   VLDL 38.2 0.0 - 40.0 mg/dL   LDL Cholesterol 24 0 - 99 mg/dL   Total CHOL/HDL Ratio 3    NonHDL 62.22   Hemoglobin A1c  Result Value Ref Range   Hgb A1c MFr Bld 7.4 (H) 4.6 - 6.5 %  Comprehensive metabolic panel  Result Value Ref Range   Sodium 140 135 - 145 mEq/L   Potassium 4.5 3.5 - 5.1  mEq/L   Chloride 103 96 - 112 mEq/L   CO2 26 19 - 32 mEq/L   Glucose, Bld 129 (H) 70 - 99 mg/dL   BUN 13 6 - 23 mg/dL   Creatinine, Ser 0.68 0.40 - 1.20 mg/dL   Total Bilirubin 0.4 0.2 - 1.2 mg/dL   Alkaline Phosphatase 81 39 - 117 U/L   AST 28 0 - 37 U/L   ALT 37 (H) 0 - 35 U/L   Total Protein 6.4 6.0 - 8.3 g/dL   Albumin 4.4 3.5 - 5.2 g/dL   GFR 93.19 >60.00 mL/min   Calcium 9.5 8.4 - 10.5 mg/dL  TSH  Result Value Ref Range   TSH 1.37 0.35 - 5.50 uIU/mL  VITAMIN D 25 Hydroxy (Vit-D Deficiency, Fractures)  Result Value Ref Range   VITD 36.83 30.00 - 100.00 ng/mL     COVID 19 screen:  No recent travel or known exposure to COVID19 The patient denies respiratory symptoms of COVID 19 at this time. The importance of social distancing was discussed today.   Assessment and Plan Problem List Items Addressed This Visit     Acute non-recurrent maxillary sinusitis - Primary    > 7 days of symptoms with fever and facial pain.  Redness in left ear.  Will treat for bacterial super-infeciton  with amoxicillin 500 mg 2 capsules twice daily Complete antibiotics.  Return and ER precautions provoided.       Relevant Medications   amoxicillin (AMOXIL) 500 MG capsule   fluconazole (DIFLUCAN) 150 MG tablet   Tachycardia    Possibly secondary to mild dehydration, caffeine and decongestant. Use ibuprofen for sore throat.  Push water intake.  Avoid decongestants and  caffeine.  Follow pulse sat home.. if npot improving in 24 hours call for further evaluation.          Eliezer Lofts, MD

## 2022-03-12 NOTE — Assessment & Plan Note (Signed)
>   7 days of symptoms with fever and facial pain.  Redness in left ear.  Will treat for bacterial super-infeciton  with amoxicillin 500 mg 2 capsules twice daily Complete antibiotics.  Return and ER precautions provoided.

## 2022-03-30 ENCOUNTER — Other Ambulatory Visit: Payer: Self-pay | Admitting: Primary Care

## 2022-03-30 DIAGNOSIS — N390 Urinary tract infection, site not specified: Secondary | ICD-10-CM | POA: Diagnosis not present

## 2022-03-30 DIAGNOSIS — I10 Essential (primary) hypertension: Secondary | ICD-10-CM

## 2022-03-30 DIAGNOSIS — E119 Type 2 diabetes mellitus without complications: Secondary | ICD-10-CM

## 2022-03-30 DIAGNOSIS — R350 Frequency of micturition: Secondary | ICD-10-CM | POA: Diagnosis not present

## 2022-04-08 ENCOUNTER — Other Ambulatory Visit: Payer: Self-pay | Admitting: Primary Care

## 2022-04-08 DIAGNOSIS — E785 Hyperlipidemia, unspecified: Secondary | ICD-10-CM

## 2022-04-18 ENCOUNTER — Encounter (INDEPENDENT_AMBULATORY_CARE_PROVIDER_SITE_OTHER): Payer: 59 | Admitting: Ophthalmology

## 2022-04-18 DIAGNOSIS — H35033 Hypertensive retinopathy, bilateral: Secondary | ICD-10-CM | POA: Diagnosis not present

## 2022-04-18 DIAGNOSIS — H353112 Nonexudative age-related macular degeneration, right eye, intermediate dry stage: Secondary | ICD-10-CM | POA: Diagnosis not present

## 2022-04-18 DIAGNOSIS — H43813 Vitreous degeneration, bilateral: Secondary | ICD-10-CM

## 2022-04-18 DIAGNOSIS — I10 Essential (primary) hypertension: Secondary | ICD-10-CM | POA: Diagnosis not present

## 2022-04-18 DIAGNOSIS — H353221 Exudative age-related macular degeneration, left eye, with active choroidal neovascularization: Secondary | ICD-10-CM

## 2022-05-07 ENCOUNTER — Ambulatory Visit: Payer: 59 | Admitting: Family Medicine

## 2022-05-07 ENCOUNTER — Encounter: Payer: Self-pay | Admitting: Family Medicine

## 2022-05-07 VITALS — BP 134/85 | HR 118 | Temp 98.0°F | Ht 64.0 in | Wt 187.4 lb

## 2022-05-07 DIAGNOSIS — R3915 Urgency of urination: Secondary | ICD-10-CM | POA: Diagnosis not present

## 2022-05-07 DIAGNOSIS — R197 Diarrhea, unspecified: Secondary | ICD-10-CM | POA: Diagnosis not present

## 2022-05-07 LAB — POC URINALSYSI DIPSTICK (AUTOMATED)
Bilirubin, UA: NEGATIVE
Blood, UA: NEGATIVE
Glucose, UA: NEGATIVE
Ketones, UA: NEGATIVE
Leukocytes, UA: NEGATIVE
Nitrite, UA: NEGATIVE
Protein, UA: NEGATIVE
Spec Grav, UA: 1.01 (ref 1.010–1.025)
Urobilinogen, UA: 0.2 E.U./dL
pH, UA: 6 (ref 5.0–8.0)

## 2022-05-07 NOTE — Patient Instructions (Addendum)
Increase  water intake.. start cranberry tablet and consider D mannose.  Viral gastroenteritis vs early urinary infection... call if not improving.

## 2022-05-07 NOTE — Progress Notes (Signed)
Patient ID: Courtney Burns, female    DOB: 09-07-1960, 62 y.o.   MRN: [id number]  This visit was conducted in person.  BP 134/85   Pulse (!) 118   Temp 98 F (36.7 C) (Temporal)   Ht '5\' 4"'$  (1.626 m)   Wt 187 lb 6 oz (85 kg)   LMP 07/07/2013   SpO2 97%   BMI 32.16 kg/m    CC:  Chief Complaint  Patient presents with   Urinary Urgency    Had UTI on 03/2022 and seen at [facility].  Tx with Macrobid bid x 5 days    Subjective:   HPI: Courtney Burns is a 62 y.o. female presenting on 05/07/2022 for Urinary Urgency (Had UTI on 03/2022 and seen at [facility].  Tx with Macrobid bid x 5 days)  Urinary Tract Infection  This is a new problem. The current episode started yesterday. The problem has been gradually improving. The quality of the pain is described as aching. The patient is experiencing no pain. There has been no fever. She is Not sexually active. Associated symptoms include frequency, nausea and urgency. Pertinent negatives include no chills, discharge, flank pain or hematuria. Associated symptoms comments:  Dribbling  Low abdominal pressure.. some diarrhea. She has tried increased fluids for the symptoms. The treatment provided mild relief. There is no history of catheterization, kidney stones, a single kidney or a urological procedure.    1/20.Marland Kitchen  UTI treated with Macrobid.      Relevant past medical, surgical, family and social history reviewed and updated as indicated. Interim medical history since our last visit reviewed. Allergies and medications reviewed and updated.     Per HPI unless specifically indicated in ROS section below Review of Systems  Constitutional:  Negative for chills.  Gastrointestinal:  Positive for nausea.  Genitourinary:  Positive for frequency and urgency. Negative for flank pain and hematuria.   Objective:  BP 134/85   Pulse (!) 118   Temp 98 F (36.7 C) (Temporal)   Ht '5\' 4"'$  (1.626 m)   Wt 187 lb 6 oz (85 kg)   LMP 07/07/2013   SpO2 97%    BMI 32.16 kg/m   Wt Readings from Last 3 Encounters:  05/07/22 187 lb 6 oz (85 kg)  03/12/22 186 lb (84.4 kg)  01/10/22 189 lb (85.7 kg)      Physical Exam Constitutional:      General: She is not in acute distress.    Appearance: Normal appearance. She is well-developed. She is not ill-appearing or toxic-appearing.  HENT:     Head: Normocephalic.     Right Ear: Hearing, tympanic membrane, ear canal and external ear normal. Tympanic membrane is not erythematous, retracted or bulging.     Left Ear: Hearing, tympanic membrane, ear canal and external ear normal. Tympanic membrane is not erythematous, retracted or bulging.     Nose: No mucosal edema or rhinorrhea.     Right Sinus: No maxillary sinus tenderness or frontal sinus tenderness.     Left Sinus: No maxillary sinus tenderness or frontal sinus tenderness.     Mouth/Throat:     Pharynx: Uvula midline.  Eyes:     General: Lids are normal. Lids are everted, no foreign bodies appreciated.     Conjunctiva/sclera: Conjunctivae normal.     Pupils: Pupils are equal, round, and reactive to light.  Neck:     Thyroid: No thyroid mass or thyromegaly.     Vascular: No carotid bruit.  Trachea: Trachea normal.  Cardiovascular:     Rate and Rhythm: Normal rate and regular rhythm.     Pulses: Normal pulses.     Heart sounds: Normal heart sounds, S1 normal and S2 normal. No murmur heard.    No friction rub. No gallop.  Pulmonary:     Effort: Pulmonary effort is normal. No tachypnea or respiratory distress.     Breath sounds: Normal breath sounds. No decreased breath sounds, wheezing, rhonchi or rales.  Abdominal:     General: Bowel sounds are normal.     Palpations: Abdomen is soft.     Tenderness: There is generalized abdominal tenderness. There is no right CVA tenderness, left CVA tenderness, guarding or rebound.  Musculoskeletal:     Cervical back: Normal range of motion and neck supple.  Skin:    General: Skin is warm and dry.      Findings: No rash.  Neurological:     Mental Status: She is alert.  Psychiatric:        Mood and Affect: Mood is not anxious or depressed.        Speech: Speech normal.        Behavior: Behavior normal. Behavior is cooperative.        Thought Content: Thought content normal.        Judgment: Judgment normal.       Results for orders placed or performed in visit on 05/07/22  POCT Urinalysis Dipstick (Automated)  Result Value Ref Range   Color, UA Yellow    Clarity, UA Clear    Glucose, UA Negative Negative   Bilirubin, UA Negative    Ketones, UA Negative    Spec Grav, UA 1.010 1.010 - 1.025   Blood, UA Negative    pH, UA 6.0 5.0 - 8.0   Protein, UA Negative Negative   Urobilinogen, UA 0.2 0.2 or 1.0 E.U./dL   Nitrite, UA Negative    Leukocytes, UA Negative Negative    Assessment and Plan  Urinary urgency -     POCT Urinalysis Dipstick (Automated)  Diarrhea, unspecified type   Symptoms likely due to either early UTI versus viral gastroenteritis.  She has had significant improvement of symptoms with pushing water.  I encouraged her to add d-mannose and cranberry to continue to prevent urinary tract infection.  There is no clear indication for antibiotics today.  She will call if her symptoms recur for repeat urinalysis.  Encouraged her to push fluids for possible viral gastroenteritis.  She has 2 coworkers out with viral diarrhea. Continue symptomatic care. No follow-ups on file.   Eliezer Lofts, MD

## 2022-06-13 ENCOUNTER — Ambulatory Visit
Admission: RE | Admit: 2022-06-13 | Discharge: 2022-06-13 | Disposition: A | Discharge status: Home / Self Care | Payer: 59 | Source: Ambulatory Visit | Attending: Primary Care | Admitting: Primary Care

## 2022-06-13 ENCOUNTER — Ambulatory Visit: Payer: 59 | Admitting: Primary Care

## 2022-06-13 ENCOUNTER — Encounter: Payer: Self-pay | Admitting: Primary Care

## 2022-06-13 VITALS — BP 132/84 | HR 117 | Temp 97.9°F | Ht 64.0 in | Wt 180.0 lb

## 2022-06-13 DIAGNOSIS — E1165 Type 2 diabetes mellitus with hyperglycemia: Secondary | ICD-10-CM

## 2022-06-13 DIAGNOSIS — R000 Tachycardia, unspecified: Secondary | ICD-10-CM | POA: Diagnosis not present

## 2022-06-13 DIAGNOSIS — Z1231 Encounter for screening mammogram for malignant neoplasm of breast: Secondary | ICD-10-CM | POA: Insufficient documentation

## 2022-06-13 LAB — BASIC METABOLIC PANEL
BUN: 12 mg/dL (ref 6–23)
CO2: 26 mEq/L (ref 19–32)
Calcium: 10 mg/dL (ref 8.4–10.5)
Chloride: 102 mEq/L (ref 96–112)
Creatinine, Ser: 0.79 mg/dL (ref 0.40–1.20)
GFR: 79.8 mL/min (ref >60.00)
Glucose, Bld: 130 mg/dL — ABNORMAL HIGH (ref 70–99)
Potassium: 4.5 mEq/L (ref 3.5–5.1)
Sodium: 141 mEq/L (ref 135–145)

## 2022-06-13 LAB — CBC
HCT: 44.1 % (ref 36.0–46.0)
Hemoglobin: 14.7 g/dL (ref 12.0–15.0)
MCHC: 33.3 g/dL (ref 30.0–36.0)
MCV: 88 fl (ref 78.0–100.0)
Platelets: 439 10*3/uL — ABNORMAL HIGH (ref 150.0–400.0)
RBC: 5.01 Mil/uL (ref 3.87–5.11)
RDW: 13.8 % (ref 11.5–15.5)
WBC: 11.1 10*3/uL — ABNORMAL HIGH (ref 4.0–10.5)

## 2022-06-13 LAB — TSH: TSH: 1.56 u[IU]/mL (ref 0.35–5.50)

## 2022-06-13 LAB — HEMOGLOBIN A1C: Hgb A1c MFr Bld: 6.7 % — ABNORMAL HIGH (ref 4.6–6.5)

## 2022-06-13 LAB — T4, FREE: Free T4: 1.19 ng/dL (ref 0.60–1.60)

## 2022-06-13 MED ORDER — METOPROLOL TARTRATE 25 MG PO TABS: 12.5000 mg | ORAL_TABLET | Freq: Two times a day (BID) | ORAL | 0 refills | Status: DC

## 2022-06-13 NOTE — Progress Notes (Signed)
Subjective:    Patient ID: Courtney Burns, female    DOB: 12/09/1960, 62 y.o.   MRN: [id number]  HPI  Courtney Burns is a very pleasant 62 y.o. female with a history of hypertension, NASH, type 2 diabetes, hypothyroidism, hyperlipidemia who presents today to discuss hypertension and tachycardia.  Currently managed on lisinopril 20 mg daily and amlodipine 5 mg daily. Several days ago she contacted our office with reports of tachycardia "over 100" and elevations of BP at 152/91 with recent diarrhea. Given this information she was asked to come in for a visit.  Today she mentions intermittent fluctuations of her HR with palpitations from 80's-130's for the last three months. Treated for sinusitis in January 2024. Treated for UTI with antibiotics at Urgent care in February. Evaluated by Dr. Diona Browner in late February for same UTI symptoms, however, urine was negative so she was initiated on Azo. Aside from Percy she denies other changes.   She has a family history of heart attack in her father and paternal grandmother. She denies chest pain, dizziness. She does notice palpitations when her heart rate increases. She does notice anxiety, but only after her heart rate increases and she feels the palpitations.   BP Readings from Last 3 Encounters:  06/13/22 132/84  05/07/22 134/85  03/12/22 (!) 142/72      Review of Systems  Constitutional:  Negative for fever.  HENT:  Negative for congestion.   Respiratory:  Negative for shortness of breath.   Cardiovascular:  Positive for palpitations. Negative for chest pain.  Gastrointestinal:  Negative for abdominal pain.  Genitourinary:  Negative for dysuria.  Neurological:  Negative for dizziness and headaches.  Psychiatric/Behavioral:  The patient is nervous/anxious.          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    GERD (gastroesophageal reflux disease)    Hx - diet controlled,  no meds   History of chicken pox    History of Papanicolaou smear of cervix 08/2013   neg per pt   HSV infection    Hypertension    Hyperthyroidism    Hypothyroidism    had radiation then thyroid became low   Ileitis    Macular degeneration of left eye    Vitamin D deficiency     Social History   Socioeconomic History   Marital status: Married    Spouse name: Not on file   Number of children: 0   Years of education: 16   Highest education level: Not on file  Occupational History   Occupation: Lobbyist: [employer]    Comment: Software engineer  Tobacco Use   Smoking status: Never   Smokeless tobacco: Never  Scientific laboratory technician Use: Never used  Substance and Sexual Activity   Alcohol use: No    Alcohol/week: 0.0 standard drinks of alcohol   Drug use: No   Sexual activity: Not Currently    Partners: Male    Birth control/protection: Post-menopausal  Other Topics Concern   Not on file  Social History Narrative   Lives in [place].    Works for [employer].   Arva grew up in [place]. She lives in [place] with her husband [name]) and their 3 cat ([name], [place], [place]).    She attended [employer]  and obtained her Bachelors in [employer] with a minor in Youth worker.    She loves cooking and sewing.   Social Determinants of Health   Financial Resource Strain: Not on file  Food Insecurity: Not on file  Transportation Needs: Not on file  Physical Activity: Not on file  Stress: Not on file  Social Connections: Not on file  Intimate Partner Violence: Not on file    Past Surgical History:  Procedure Laterality Date   APPENDECTOMY  1970   BUNIONECTOMY Right 07/2001   right foot   CHOLECYSTECTOMY  2005   COLONOSCOPY  01/2004; 06/2015   Dr. Minette Headland, [place] - polyp; nl   [place]   left salpingectomy   RIGHT OOPHORECTOMY Right 03/2003   Abscessed cyst removed   WISDOM TOOTH EXTRACTION      Family History  Problem Relation Age of Onset   Hypertension Mother    Diabetes Mother    Cancer Mother 29       GBM   Hyperlipidemia Father    Heart disease Father        MI   Hypertension Father    Diabetes Father    Depression Father    COPD Father    Diabetes Maternal Aunt        Uncontrolled with complications   Kidney disease Maternal Aunt        Renal failure - dialysis   Cancer Maternal Uncle 1       brain tumor   Stroke Maternal Grandmother    Liver cancer Maternal Grandfather    Cancer Maternal Grandfather 34       lung   Heart disease Paternal Grandmother        MI   Diabetes Paternal Grandmother    Prostate cancer Paternal Grandfather 56   Cancer - Prostate Paternal Grandfather    Colon cancer Neg Hx    Esophageal cancer Neg Hx    Rectal cancer Neg Hx    Stomach cancer Neg Hx    Breast cancer Neg Hx     Allergies  Allergen Reactions   Glipizide Itching   Sulfa Antibiotics Rash    Current Outpatient Medications on File Prior to Visit  Medication Sig Dispense Refill   amLODipine (NORVASC) 5 MG tablet TAKE 1 TABLET BY MOUTH EVERY DAY FOR BLOOD PRESSURE 90 tablet 2   brimonidine (ALPHAGAN) 0.2 % ophthalmic solution Place 1 drop into the left eye 2 (two) times daily.     CONTOUR NEXT TEST test strip USE AS INSTRUCTED TO TEST BLOOD SUGAR UP TO 3 TIMES DAILY 100 strip 5   levothyroxine (SYNTHROID) 75 MCG tablet TAKE 1 TAB EVERY AM ON EMPTY STOMACH W/WATER ONLY. NO FOOD OR OTHER MEDICATIONS FOR 30 MINUTES. 90 tablet 3   lisinopril (ZESTRIL) 20 MG tablet TAKE 1 TABLET (20 MG TOTAL) BY MOUTH DAILY. FOR BLOOD PRESSURE. 90 tablet 3   metFORMIN (GLUCOPHAGE) 1000 MG tablet TAKE 1 TABLET (1,000 MG TOTAL) BY MOUTH 2 (TWO) TIMES DAILY WITH A MEAL. FOR DIABETES. 180 tablet 1   PRESCRIPTION MEDICATION Eye injection for wet macular degeneration in her  left eye monthly for life, given at [place] in [facility]     rosuvastatin (CRESTOR) 10 MG tablet Take 1 tablet (10 mg total) by mouth daily. for cholesterol. 90 tablet 3   Semaglutide, 1 MG/DOSE, 4 MG/3ML SOPN Inject 1 mg as directed once a week. for diabetes. 9  mL 1   valACYclovir (VALTREX) 500 MG tablet TAKE 1 TABLET BY MOUTH TWICE A DAY 6 tablet 0   No current facility-administered medications on file prior to visit.    BP 132/84   Pulse (!) 117   Temp 97.9 F (36.6 C) (Temporal)   Ht 5\' 4"  (1.626 m)   Wt 180 lb (81.6 kg)   LMP 07/07/2013   SpO2 98%   BMI 30.90 kg/m  Objective:   Physical Exam Cardiovascular:     Rate and Rhythm: Regular rhythm. Tachycardia present.     Heart sounds: Murmur heard.     Comments: Mild murmur noted over the aortic valve Pulmonary:     Effort: Pulmonary effort is normal.     Breath sounds: Normal breath sounds.  Musculoskeletal:     Cervical back: Neck supple.  Skin:    General: Skin is warm and dry.  Neurological:     Mental Status: She is alert.  Psychiatric:        Mood and Affect: Mood normal.           Assessment & Plan:  Tachycardia Assessment & Plan: Unclear cause, do not suspect dehydration/infection at this point.  ECG today with sinus tachycardia with rate of 115. No PAC or PVC, no acute ST changes. Appears similar but slower than prior ECG from April 2023.  Checking labs today including TSH, Free T4, BMP, CBC. Referral placed to cardiology for further evaluation.   Start metoprolol tartrate 12.5 mg BID PRN. Discussed with patient today.   Orders: -     TSH -     CBC -     T4, free -     Basic metabolic panel -     EKG [id number] -     Ambulatory referral to Cardiology -     Metoprolol Tartrate; Take 0.5 tablets (12.5 mg total) by mouth 2 (two) times daily.  Dispense: 30 tablet; Refill: 0  Type 2 diabetes mellitus with hyperglycemia, without long-term current use of insulin -     Hemoglobin  A1c        Pleas Koch, NP

## 2022-06-13 NOTE — Assessment & Plan Note (Addendum)
Unclear cause, do not suspect dehydration/infection at this point.  ECG today with sinus tachycardia with rate of 115. No PAC or PVC, no acute ST changes. Appears similar but slower than prior ECG from April 2023.  Checking labs today including TSH, Free T4, BMP, CBC. Referral placed to cardiology for further evaluation.   Start metoprolol tartrate 12.5 mg BID PRN. Discussed with patient today.

## 2022-06-13 NOTE — Patient Instructions (Addendum)
Stop by the lab prior to leaving today. I will notify you of your results once received.   You will either be contacted via phone regarding your referral to cardiology, or you may receive a letter on your MyChart portal from our referral team with instructions for scheduling an appointment. Please let us know if you have not been contacted by anyone within two weeks.  Start metoprolol tartrate 25 mg. Take 1/2 tablet twice daily for palpitations as needed.  It was a pleasure to see you today!

## 2022-06-14 ENCOUNTER — Encounter (INDEPENDENT_AMBULATORY_CARE_PROVIDER_SITE_OTHER): Payer: 59 | Admitting: Ophthalmology

## 2022-06-14 DIAGNOSIS — H43813 Vitreous degeneration, bilateral: Secondary | ICD-10-CM | POA: Diagnosis not present

## 2022-06-14 DIAGNOSIS — H353112 Nonexudative age-related macular degeneration, right eye, intermediate dry stage: Secondary | ICD-10-CM | POA: Diagnosis not present

## 2022-06-14 DIAGNOSIS — I10 Essential (primary) hypertension: Secondary | ICD-10-CM

## 2022-06-14 DIAGNOSIS — H35033 Hypertensive retinopathy, bilateral: Secondary | ICD-10-CM | POA: Diagnosis not present

## 2022-06-14 DIAGNOSIS — H353221 Exudative age-related macular degeneration, left eye, with active choroidal neovascularization: Secondary | ICD-10-CM | POA: Diagnosis not present

## 2022-06-20 ENCOUNTER — Encounter: Payer: Self-pay | Admitting: Cardiology

## 2022-06-20 NOTE — Progress Notes (Signed)
Cardiology Office Note   Date:  06/21/2022   ID:  Courtney FavaSherry E Martel, DOB 11/09/1960, MRN [id number]  PCP:  Doreene Nestlark, Katherine K, NP  Cardiologist:   Rollene RotundaJames Camara Renstrom, MD Referring:  Doreene Nestlark, Katherine K, NP  Chief Complaint  Patient presents with   Palpitations      History of Present Illness: Courtney Burns is a 62 y.o. female who was referred by Doreene Nestlark, Katherine K, NP.  For evaluation of tachycardia.  She has no past cardiac history has been a distant history of stress testing.  She does have a family history of some early onset vascular disease.  She has had tachycardia noted previously during doctors appointments.  She says that at home her resting heart rate can be in the 80s but goes up fairly quickly particularly she is anxious.  She knew it would be up today because she was in the doctor's office.  She has had some thyroid issues but recently has had thyroid studies.  Her primary provider suggested she take metoprolol 12 and half milligrams twice daily the other day but the patient is yet to start this.  She walks a mile and a half daily.  She denies any cardiovascular symptoms.  Does not have chest pressure, neck or arm discomfort.  She does occasionally feel her resting heart rate.  She does not describe presyncope or syncope.  She has no weight gain or edema.  She is actually lost 25 pounds on Ozempic.    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 Luellen [place]ucker[place]ahl [place] - polyp; nl   ECTOPIC PREGNANCY SURGERY  1995   left salpingectomy   RIGHT OOPHORECTOMY Right 03/2003   Abscessed cyst removed   WISDOM TOOTH EXTRACTION       Current Outpatient Medications  Medication Sig Dispense Refill   amLODipine (NORVASC) 5 MG tablet TAKE 1 TABLET BY MOUTH EVERY DAY FOR BLOOD PRESSURE 90 tablet 2   brimonidine (ALPHAGAN) 0.2 % ophthalmic solution Place 1 drop into the left eye 2 (two) times daily.     CONTOUR NEXT TEST test strip USE AS INSTRUCTED TO TEST BLOOD SUGAR UP TO 3 TIMES DAILY 100 strip 5   levothyroxine (SYNTHROID) 75 MCG tablet TAKE 1 TAB EVERY AM ON EMPTY STOMACH W/WATER ONLY. NO FOOD OR OTHER MEDICATIONS FOR 30 MINUTES. 90 tablet 3   lisinopril (ZESTRIL) 20 MG tablet TAKE 1 TABLET (20 MG TOTAL) BY MOUTH DAILY. FOR BLOOD PRESSURE. 90 tablet 3   metFORMIN (GLUCOPHAGE) 1000 MG tablet TAKE 1 TABLET (1,000 MG TOTAL) BY MOUTH 2 (TWO) TIMES DAILY WITH A MEAL. FOR DIABETES. 180 tablet 1   metoprolol tartrate (LOPRESSOR) 25 MG tablet Take 0.5 tablets (12.5 mg total) by mouth  2 (two) times daily. 30 tablet 0   PRESCRIPTION MEDICATION Eye injection for wet macular degeneration in her left eye monthly for life, given at [facility] in [facility]     rosuvastatin (CRESTOR) 10 MG tablet Take 1 tablet (10 mg total) by mouth daily. for cholesterol. 90 tablet 3   Semaglutide, 1 MG/DOSE, 4 MG/3ML SOPN Inject 1 mg as directed once a week. for diabetes. 9 mL 1   valACYclovir (VALTREX) 500 MG tablet TAKE 1 TABLET BY MOUTH TWICE A DAY 6 tablet 0   No current facility-administered medications for this visit.    Allergies:   Glipizide and Sulfa antibiotics    Social History:  The patient  reports that she has never smoked. She has never used smokeless tobacco. She reports that she does not drink alcohol and does not use drugs.   Family History:  The patient's family  history includes COPD in her father; Cancer (age of onset: 67) in her maternal uncle; Cancer (age of onset: 7) in her maternal grandfather; Cancer (age of onset: 46) in her mother; Cancer - Prostate in her paternal grandfather; Depression in her father; Diabetes in her father, maternal aunt, mother, and paternal grandmother; Heart disease in her paternal grandmother; Heart disease (age of onset: 41) in her father; Hyperlipidemia in her father; Hypertension in her father and mother; Kidney disease in her maternal aunt; Liver cancer in her maternal grandfather; Prostate cancer (age of onset: 20) in her paternal grandfather; Stroke in her maternal grandmother.    ROS:  Please see the history of present illness.   Otherwise, review of systems are positive for none.   All other systems are reviewed and negative.    PHYSICAL EXAM: VS:  BP (!) 162/72 (BP Location: Left Arm, Patient Position: Sitting, Cuff Size: Normal)   Pulse (!) 117   Ht 5\' 4"  (1.626 m)   Wt 182 lb (82.6 kg)   LMP 07/07/2013   SpO2 96%   BMI 31.24 kg/m  , BMI Body mass index is 31.24 kg/m. GENERAL:  Well appearing HEENT:  Pupils equal round and reactive, fundi not visualized, oral mucosa unremarkable NECK:  No jugular venous distention, waveform within normal limits, carotid upstroke brisk and symmetric, no bruits, no thyromegaly LYMPHATICS:  No cervical, inguinal adenopathy LUNGS:  Clear to auscultation bilaterally BACK:  No CVA tenderness CHEST:  Unremarkable HEART:  PMI not displaced or sustained,S1 and S2 within normal limits, no S3, no S4, no clicks, no rubs, soft apical systolic murmur nonradiating, no diastolic murmurs ABD:  Flat, positive bowel sounds normal in frequency in pitch, no bruits, no rebound, no guarding, no midline pulsatile mass, no hepatomegaly, no splenomegaly EXT:  2 plus pulses throughout, no edema, no cyanosis no clubbing SKIN:  No rashes no nodules NEURO:  Cranial nerves II through XII grossly  intact, motor grossly intact throughout PSYCH:  Cognitively intact, oriented to person place and time   EKG:  EKG is ordered today. The ekg ordered today demonstrates sinus tachycardia, rate 117, axis within normal limits, intervals within normal limits, no acute ST-T wave changes.   Recent Labs: 01/10/2022: ALT 37 06/13/2022: BUN 12; Creatinine, Ser 0.79; Hemoglobin 14.7; Platelets 439.0; Potassium 4.5; Sodium 141; TSH 1.56    Lipid Panel    Component Value Date/Time   CHOL 96 01/10/2022 0827   TRIG 191.0 (H) 01/10/2022 0827   HDL 33.70 (L) 01/10/2022 0827   CHOLHDL 3 01/10/2022 0827   VLDL 38.2 01/10/2022 0827  LDLCALC 24 01/10/2022 0827   LDLDIRECT 133.0 06/12/2021 0752      Wt Readings from Last 3 Encounters:  06/21/22 182 lb (82.6 kg)  06/13/22 180 lb (81.6 kg)  05/07/22 187 lb 6 oz (85 kg)      Other studies Reviewed: Additional studies/ records that were reviewed today include: Primary care records. Review of the above records demonstrates:  Please see elsewhere in the note.     ASSESSMENT AND PLAN:  Tachycardia: She has sinus tachycardia.  Today in the office she was not orthostatic.  I am going to apply a 3-day monitor.  After wearing the monitor she is going to be instructed to go ahead and start her metoprolol 12 and half milligrams twice daily that was prescribed by her primary provider.  She is instructed to get a wearable like a Fitbit to track her heart rates and I be happy to review these.  DM: Her blood sugar is 6.7 down from 7.4.  Management per her primary provider.  Risk reduction: I am going to suggest coronary calcium score given her family history.   Current medicines are reviewed at length with the patient today.  The patient does not have concerns regarding medicines.  The following changes have been made:  no change  Labs/ tests ordered today include:   Orders Placed This Encounter  Procedures   CT CARDIAC SCORING (SELF PAY ONLY)   LONG  TERM MONITOR (3-14 DAYS)   EKG 12-Lead     Disposition:   FU with me after the Zio patch.   Signed, Rollene Rotunda, MD  06/21/2022 3:49 PM    [facility]

## 2022-06-21 ENCOUNTER — Ambulatory Visit (INDEPENDENT_AMBULATORY_CARE_PROVIDER_SITE_OTHER): Payer: 59

## 2022-06-21 ENCOUNTER — Encounter: Payer: Self-pay | Admitting: Cardiology

## 2022-06-21 ENCOUNTER — Ambulatory Visit: Payer: 59 | Attending: Cardiology | Admitting: Cardiology

## 2022-06-21 VITALS — BP 162/72 | HR 117 | Ht 64.0 in | Wt 182.0 lb

## 2022-06-21 DIAGNOSIS — R9431 Abnormal electrocardiogram [ECG] [EKG]: Secondary | ICD-10-CM

## 2022-06-21 DIAGNOSIS — R000 Tachycardia, unspecified: Secondary | ICD-10-CM

## 2022-06-21 NOTE — Progress Notes (Unsigned)
Enrolled patient for a 3 day Zio XT monitor to be mailed to patients home  

## 2022-06-21 NOTE — Patient Instructions (Signed)
Medication Instructions:   Starting taking Metoprolol  twice a day after you complete wearing  Zio Monitor *If you need a refill on your cardiac medications before your next appointment, please call your pharmacy*   Lab Work: Not needed    Testing/Procedures:  Will be mailed to you in 3 to 7 days  Your physician has recommended that you wear a holter monitor - 3 days . Holter monitors are medical devices that record the heart's electrical activity. Doctors most often use these monitors to diagnose arrhythmias. Arrhythmias are problems with the speed or rhythm of the heartbeat. The monitor is a small, portable device. You can wear one while you do your normal daily activities. This is usually used to diagnose what is causing palpitations/syncope (passing out). and   CT coronary calcium score.   Test locations:  [facility]   This is $99 out of pocket.   Coronary CalciumScan A coronary calcium scan is an imaging test used to look for deposits of calcium and other fatty materials (plaques) in the inner lining of the blood vessels of the heart (coronary arteries). These deposits of calcium and plaques can partly clog and narrow the coronary arteries without producing any symptoms or warning signs. This puts a person at risk for a heart attack. This test can detect these deposits before symptoms develop. Tell a health care provider about: Any allergies you have. All medicines you are taking, including vitamins, herbs, eye drops, creams, and over-the-counter medicines. Any problems you or family members have had with anesthetic medicines. Any blood disorders you have. Any surgeries you have had. Any medical conditions you have. Whether you are pregnant or may be pregnant. What are the risks? Generally, this is a safe procedure. However, problems may occur, including: Harm to a pregnant woman and her unborn baby. This test involves the use of radiation.  Radiation exposure can be dangerous to a pregnant woman and her unborn baby. If you are pregnant, you generally should not have this procedure done. Slight increase in the risk of cancer. This is because of the radiation involved in the test. What happens before the procedure? No preparation is needed for this procedure. What happens during the procedure? You will undress and remove any jewelry around your neck or chest. You will put on a hospital gown. Sticky electrodes will be placed on your chest. The electrodes will be connected to an electrocardiogram (ECG) machine to record a tracing of the electrical activity of your heart. A CT scanner will take pictures of your heart. During this time, you will be asked to lie still and hold your breath for 2-3 seconds while a picture of your heart is being taken. The procedure may vary among health care providers and hospitals. What happens after the procedure? You can get dressed. You can return to your normal activities. It is up to you to get the results of your test. Ask your health care provider, or the department that is doing the test, when your results will be ready. Summary A coronary calcium scan is an imaging test used to look for deposits of calcium and other fatty materials (plaques) in the inner lining of the blood vessels of the heart (coronary arteries). Generally, this is a safe procedure. Tell your health care provider if you are pregnant or may be pregnant. No preparation is needed for this procedure. A CT scanner will take pictures of your heart. You can return to your normal activities after  the scan is done. This information is not intended to replace advice given to you by your health care provider. Make sure you discuss any questions you have with your health care provider. Document Released: 08/24/2007 Document Revised: 01/15/2016 Document Reviewed: 01/15/2016 Elsevier Interactive Patient Education  2017 [employer].   Follow-Up: At [facility], you and your health needs are our priority.  As part of our continuing mission to provide you with exceptional heart care, we have created designated Provider Care Teams.  These Care Teams include your primary Cardiologist (physician) and Advanced Practice Providers (APPs -  Physician Assistants and Nurse Practitioners) who all work together to provide you with the care you need, when you need it.     Your next appointment:   4 to 6 week(s)  The format for your next appointment:   In Person  Provider:   None    Other Instructions ZIO XT- Long Term Monitor Instructions  Your physician has requested you wear a ZIO patch monitor for 3 days.  This is a single patch monitor. [employer] supplies one patch monitor per enrollment. Additional stickers are not available. Please do not apply patch if you will be having a Nuclear Stress Test,  Echocardiogram, Cardiac CT, MRI, or Chest Xray during the period you would be wearing the  monitor. The patch cannot be worn during these tests. You cannot remove and re-apply the  ZIO XT patch monitor.  Your ZIO patch monitor will be mailed 3 day USPS to your address on file. It may take 3-5 days  to receive your monitor after you have been enrolled.  Once you have received your monitor, please review the enclosed instructions. Your monitor  has already been registered assigning a specific monitor serial # to you.  Billing and Patient Assistance Program Information  We have supplied [employer] with any of your insurance information on file for billing purposes. [employer] offers a sliding scale Patient Assistance Program for patients that do not have  insurance, or whose insurance does not completely cover the cost of the ZIO monitor.  You must apply for the Patient Assistance Program to qualify for this discounted rate.  To apply, please call [employer] at [phone], select option 4, select option 2, ask to apply for   Patient Assistance Program. Meredeth Ide will ask your household income, and how many people  are in your household. They will quote your out-of-pocket cost based on that information.  [employer] will also be able to set up a 24-month, interest-free payment plan if needed.  Applying the monitor   Shave hair from upper left chest.  Hold abrader disc by orange tab. Rub abrader in 40 strokes over the upper left chest as  indicated in your monitor instructions.  Clean area with 4 enclosed alcohol pads. Let dry.  Apply patch as indicated in monitor instructions. Patch will be placed under collarbone on left  side of chest with arrow pointing upward.  Rub patch adhesive wings for 2 minutes. Remove white label marked "1". Remove the white  label marked "2". Rub patch adhesive wings for 2 additional minutes.  While looking in a mirror, press and release button in center of patch. A small green light will  flash 3-4 times. This will be your only indicator that the monitor has been turned on.  Do not shower for the first 24 hours. You may shower after the first 24 hours.  Press the button if you feel a symptom. You will hear a  small click. Record Date, Time and  Symptom in the Patient Logbook.  When you are ready to remove the patch, follow instructions on the last 2 pages of Patient  Logbook. Stick patch monitor onto the last page of Patient Logbook.  Place Patient Logbook in the blue and white box. Use locking tab on box and tape box closed  securely. The blue and white box has prepaid postage on it. Please place it in the mailbox as  soon as possible. Your physician should have your test results approximately 7 days after the  monitor has been mailed back to [facility].  Call [facility] Customer Care at [phone] if you have questions regarding  your ZIO XT patch monitor. Call them immediately if you see an orange light blinking on your  monitor.  If your monitor falls off in less than 4  days, contact our Monitor department at [phone].  If your monitor becomes loose or falls off after 4 days call [employer] at [phone] for  suggestions on securing your monitor

## 2022-06-26 DIAGNOSIS — R000 Tachycardia, unspecified: Secondary | ICD-10-CM

## 2022-06-26 DIAGNOSIS — R9431 Abnormal electrocardiogram [ECG] [EKG]: Secondary | ICD-10-CM

## 2022-06-27 ENCOUNTER — Other Ambulatory Visit: Payer: Self-pay | Admitting: Primary Care

## 2022-06-27 DIAGNOSIS — E785 Hyperlipidemia, unspecified: Secondary | ICD-10-CM

## 2022-06-27 DIAGNOSIS — E1165 Type 2 diabetes mellitus with hyperglycemia: Secondary | ICD-10-CM

## 2022-06-30 ENCOUNTER — Ambulatory Visit (INDEPENDENT_AMBULATORY_CARE_PROVIDER_SITE_OTHER): Payer: 59

## 2022-06-30 ENCOUNTER — Ambulatory Visit
Admission: EM | Admit: 2022-06-30 | Discharge: 2022-06-30 | Disposition: A | Discharge status: Home / Self Care | Payer: 59 | Attending: Emergency Medicine | Admitting: Emergency Medicine

## 2022-06-30 DIAGNOSIS — M7989 Other specified soft tissue disorders: Secondary | ICD-10-CM | POA: Diagnosis not present

## 2022-06-30 DIAGNOSIS — M79642 Pain in left hand: Secondary | ICD-10-CM | POA: Insufficient documentation

## 2022-06-30 DIAGNOSIS — S60222A Contusion of left hand, initial encounter: Secondary | ICD-10-CM

## 2022-06-30 NOTE — Discharge Instructions (Addendum)
Take Tylenol or ibuprofen as needed.  Rest and elevate your hand.  Apply ice packs 2-3 times a day for up to 20 minutes each.     Follow up with an orthopedist if your symptoms are not improving.

## 2022-06-30 NOTE — ED Triage Notes (Signed)
Patient to Urgent Care with complaints of left sided hand swelling and bruising that started after a fall that occurred yesterday afternoon. Has been applying ice. Woke up this morning with worsening swelling and bruising.

## 2022-06-30 NOTE — ED Provider Notes (Signed)
Renaldo Fiddler    CSN: [id number] Arrival date & time: 06/30/22  1141      History   Chief Complaint Chief Complaint  Patient presents with   Hand Pain    HPI Courtney Burns is a 62 y.o. female.  Patient presents with left hand pain, swelling, bruising after she accidentally tripped and fell yesterday.  She landed on her hand.  No head injury or loss of consciousness.  Treatment with ice packs yesterday.  The swelling is worse today.  No open wounds, numbness, weakness or other symptoms.  Her medical history includes hypertension, diabetes, hypothyroidism.   The history is provided by the patient and medical records.    Past Medical History:  Diagnosis Date   Diabetes mellitus without complication    type 2   Dry age-related macular degeneration of right eye    GERD (gastroesophageal reflux disease)    Hx - diet controlled,  no meds   History of Papanicolaou smear of cervix 08/2013   neg per pt   HSV infection    Hypertension    Hyperthyroidism    Hypothyroidism    had radiation then thyroid became low   Ileitis    Macular degeneration of left eye    Vitamin D deficiency     Patient Active Problem List   Diagnosis Date Noted   Hand pain, left 06/30/2022   Acute non-recurrent maxillary sinusitis 03/12/2022   Tachycardia 03/12/2022   Acute shoulder pain 06/12/2021   Vaginal itching 05/25/2020   Chronic right-sided thoracic back pain 12/03/2019   Macular degeneration 08/11/2019   Genital herpes 08/11/2019   Essential hypertension 12/28/2016   Elevated liver function tests 05/23/2015   Menopausal disorder 05/16/2015   Vitamin D deficiency 05/16/2015   Solar dermatitis 10/04/2014   NASH (nonalcoholic steatohepatitis) 02/07/2014   Annual physical exam 08/19/2013   Hypothyroidism 08/19/2013   HLD (hyperlipidemia) 08/19/2013   Type 2 diabetes mellitus 08/19/2013   Obesity 07/22/2013    Past Surgical History:  Procedure Laterality Date   APPENDECTOMY   1970   BUNIONECTOMY Right 07/2001   right foot   CHOLECYSTECTOMY  2005   COLONOSCOPY  01/2004; 06/2015   Dr. Luellen Pucker, [place] - polyp; nl   ECTOPIC PREGNANCY SURGERY  1995   left salpingectomy   RIGHT OOPHORECTOMY Right 03/2003   Abscessed cyst removed   WISDOM TOOTH EXTRACTION      OB History     Gravida  2   Para      Term      Preterm      AB  2   Living         SAB  1   IAB      Ectopic  1   Multiple      Live Births               Home Medications    Prior to Admission medications   Medication Sig Start Date End Date Taking? Authorizing Provider  amLODipine (NORVASC) 5 MG tablet TAKE 1 TABLET BY MOUTH EVERY DAY FOR BLOOD PRESSURE 03/31/22   Doreene Nest, NP  brimonidine (ALPHAGAN) 0.2 % ophthalmic solution Place 1 drop into the left eye 2 (two) times daily. 02/27/19   [provider]  CONTOUR NEXT TEST test strip USE AS INSTRUCTED TO TEST BLOOD SUGAR UP TO 3 TIMES DAILY 06/18/19   Doreene Nest, NP  levothyroxine (SYNTHROID) 75 MCG tablet TAKE 1 TAB EVERY AM  ON EMPTY STOMACH W/WATER ONLY. NO FOOD OR OTHER MEDICATIONS FOR 30 MINUTES. 01/18/22   Doreene Nest, NP  lisinopril (ZESTRIL) 20 MG tablet TAKE 1 TABLET (20 MG TOTAL) BY MOUTH DAILY. FOR BLOOD PRESSURE. 01/21/22   Doreene Nest, NP  metFORMIN (GLUCOPHAGE) 1000 MG tablet TAKE 1 TABLET (1,000 MG TOTAL) BY MOUTH 2 (TWO) TIMES DAILY WITH A MEAL. FOR DIABETES. 03/31/22   Doreene Nest, NP  metoprolol tartrate (LOPRESSOR) 25 MG tablet Take 0.5 tablets (12.5 mg total) by mouth 2 (two) times daily. 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 for cholesterol. 06/27/22   Doreene Nest, NP  Semaglutide, 1 MG/DOSE, (OZEMPIC, 1  MG/DOSE,) 4 MG/3ML SOPN INJECT 1 MG AS DIRECTED ONCE A WEEK. FOR DIABETES. 06/27/22   Doreene Nest, NP  valACYclovir (VALTREX) 500 MG tablet TAKE 1 TABLET BY MOUTH TWICE A DAY 11/19/20   Doreene Nest, NP    Family History Family History  Problem Relation Age of Onset   Hypertension Mother    Diabetes Mother    Cancer Mother 32       GBM   Hyperlipidemia Father    Heart disease Father 36       MI   Hypertension Father    Diabetes Father    Depression Father    COPD Father    Stroke Maternal Grandmother    Liver cancer Maternal Grandfather    Cancer Maternal Grandfather 98       lung   Heart disease Paternal Grandmother        MI   Diabetes Paternal Grandmother    Prostate cancer Paternal Grandfather 41   Cancer - Prostate Paternal Grandfather    Diabetes Maternal Aunt        Uncontrolled with complications   Kidney disease Maternal Aunt        Renal failure - dialysis   Cancer Maternal Uncle 42       brain tumor   Colon cancer Neg Hx    Esophageal cancer Neg Hx    Rectal cancer Neg Hx    Stomach cancer Neg Hx    Breast cancer Neg Hx     Social History Social History   Tobacco Use   Smoking status: Never   Smokeless tobacco: Never  Vaping Use   Vaping Use: Never used  Substance Use Topics   Alcohol use: No    Alcohol/week: 0.0 standard drinks of alcohol   Drug use: No     Allergies   Glipizide and Sulfa antibiotics   Review of Systems Review of Systems  Constitutional:  Negative for chills and fever.  Musculoskeletal:  Positive for arthralgias and joint swelling.  Skin:  Positive for color change. Negative for wound.  Neurological:  Negative for numbness.  All other systems reviewed and are negative.    Physical Exam Triage Vital Signs ED Triage Vitals  Enc Vitals Group     BP 06/30/22 1243 131/84     Pulse Rate 06/30/22 1235 95     Resp 06/30/22 1235 18     Temp 06/30/22 1235 98.1 F (36.7 C)     Temp src --      SpO2 06/30/22  1235 98 %  Weight --      Height --      Head Circumference --      Peak Flow --      Pain Score 06/30/22 1240 5     Pain Loc --      Pain Edu? --      Excl. in GC? --    No data found.  Updated Vital Signs BP 131/84   Pulse 95   Temp 98.1 F (36.7 C)   Resp 18   LMP 07/07/2013   SpO2 98%   Visual Acuity Right Eye Distance:   Left Eye Distance:   Bilateral Distance:    Right Eye Near:   Left Eye Near:    Bilateral Near:     Physical Exam Vitals and nursing note reviewed.  Constitutional:      General: She is not in acute distress.    Appearance: Normal appearance. She is well-developed. She is not ill-appearing.  HENT:     Mouth/Throat:     Mouth: Mucous membranes are moist.  Cardiovascular:     Rate and Rhythm: Normal rate and regular rhythm.  Pulmonary:     Effort: Pulmonary effort is normal. No respiratory distress.  Musculoskeletal:        General: Swelling and tenderness present. No deformity. Normal range of motion.     Cervical back: Neck supple.     Comments: Tender and edematous on dorsum of left hand.  Skin:    General: Skin is warm and dry.     Capillary Refill: Capillary refill takes less than 2 seconds.     Findings: Bruising present. No lesion.  Neurological:     General: No focal deficit present.     Mental Status: She is alert and oriented to person, place, and time.     Sensory: No sensory deficit.     Motor: No weakness.  Psychiatric:        Mood and Affect: Mood normal.        Behavior: Behavior normal.      UC Treatments / Results  Labs (all labs ordered are listed, but only abnormal results are displayed) Labs Reviewed - No data to display  EKG   Radiology DG Hand Complete Left  Result Date: 06/30/2022 CLINICAL DATA:  Left hand pain and swelling after fall EXAM: LEFT HAND - COMPLETE 3+ VIEW COMPARISON:  None Available. FINDINGS: There is no evidence of fracture or dislocation. 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 Mild soft tissue swelling over the dorsum of the hand. IMPRESSION: 1. No acute fracture or dislocation of the left hand. 2. Mild soft tissue swelling over the dorsum of the hand. Electronically Signed   By: Duanne Guess D.O.   On: 06/30/2022 13:04    Procedures Procedures (including critical care time)  Medications Ordered in UC Medications - No data to display  Initial Impression / Assessment and Plan / UC Course  I have reviewed the triage vital signs and the nursing notes.  Pertinent labs & imaging results that were available during my care of the patient were reviewed by me and considered in my medical decision making (see chart for details).    Left hand pain and contusion.  X-ray negative.  Treating with rest, elevation, ice packs, Tylenol or ibuprofen.  Education provided on hand contusion.  Instructed patient to follow-up with orthopedics if her symptoms are not improving.  Contact information for on-call Ortho  provided.  Patient agrees to plan of care.   Final Clinical Impressions(s) / UC Diagnoses   Final diagnoses:  Hand pain, left  Contusion of left hand, initial encounter     Discharge Instructions      Take Tylenol or ibuprofen as needed.  Rest and elevate your hand.  Apply ice packs 2-3 times a day for up to 20 minutes each.     Follow up with an orthopedist if your symptoms are not improving.        ED Prescriptions   None    PDMP not reviewed this encounter.   Mickie Bail, NP 06/30/22 1323

## 2022-07-03 DIAGNOSIS — R000 Tachycardia, unspecified: Secondary | ICD-10-CM | POA: Diagnosis not present

## 2022-07-03 DIAGNOSIS — R9431 Abnormal electrocardiogram [ECG] [EKG]: Secondary | ICD-10-CM | POA: Diagnosis not present

## 2022-07-05 ENCOUNTER — Other Ambulatory Visit: Payer: Self-pay | Admitting: Primary Care

## 2022-07-05 DIAGNOSIS — R000 Tachycardia, unspecified: Secondary | ICD-10-CM

## 2022-07-05 NOTE — Telephone Encounter (Signed)
Noted  

## 2022-07-05 NOTE — Telephone Encounter (Signed)
Call patient:  I reviewed her visit with the cardiologist and I see that she had not started the metoprolol.  We received a refill request from her pharmacy for metoprolol.  She start taking this yet?  I assume she does not need a refill?

## 2022-07-05 NOTE — Telephone Encounter (Signed)
Patient stated she was told to take the metoprolol as needed in between seeing Jae Dire and seeing the cardiologist, she stated she only needed to take it 4 times within that week. Patient wore heart monitor for 3 days after seeing cardiology and since April 20th she has been taking the metoprolol two times daily. She does not currently need a refill, she will let us know when she does.

## 2022-07-10 ENCOUNTER — Ambulatory Visit (INDEPENDENT_AMBULATORY_CARE_PROVIDER_SITE_OTHER): Payer: 59

## 2022-07-10 DIAGNOSIS — Z23 Encounter for immunization: Secondary | ICD-10-CM

## 2022-07-11 ENCOUNTER — Ambulatory Visit: Payer: 59 | Admitting: Primary Care

## 2022-07-19 ENCOUNTER — Ambulatory Visit (HOSPITAL_BASED_OUTPATIENT_CLINIC_OR_DEPARTMENT_OTHER)
Admission: RE | Admit: 2022-07-19 | Discharge: 2022-07-19 | Disposition: A | Discharge status: Home / Self Care | Payer: Self-pay | Source: Ambulatory Visit | Attending: Cardiology | Admitting: Cardiology

## 2022-07-19 DIAGNOSIS — R9431 Abnormal electrocardiogram [ECG] [EKG]: Secondary | ICD-10-CM | POA: Insufficient documentation

## 2022-07-19 DIAGNOSIS — R000 Tachycardia, unspecified: Secondary | ICD-10-CM | POA: Insufficient documentation

## 2022-07-22 ENCOUNTER — Ambulatory Visit: Payer: 59 | Admitting: Cardiology

## 2022-07-22 ENCOUNTER — Other Ambulatory Visit: Payer: Self-pay | Admitting: Primary Care

## 2022-07-22 DIAGNOSIS — R000 Tachycardia, unspecified: Secondary | ICD-10-CM

## 2022-07-28 NOTE — Progress Notes (Unsigned)
  Cardiology Office Note:   Date:  07/30/2022  ID:  ADELEINE GERADS, DOB [birth year], MRN [id number]  History of Present Illness:   Courtney Burns is a 62 y.o. female who was referred by Doreene Nest, NP for evaluation of tachycardia.  Monitor demonstrated no sustained arrhythmias and brief runs of SVT.  She started low dose beta blocker.  Calcium score demonstrated a calcium score of zero.  Since I last saw her she has been taking beta-blocker and doing well.  Her resting heart rates in the 80s instead of above 100.  She is not having any palpitations.  She continues to lose weight.  She is walking a mile and a half 5 days a week. The patient denies any new symptoms such as chest discomfort, neck or arm discomfort. There has been no new shortness of breath, PND or orthopnea. There have been no reported palpitations, presyncope or syncope.   ROS: As stated in the HPI and negative for all other systems.   Studies Reviewed:    EKG:  NA  Risk Assessment/Calculations:              Physical Exam:   VS:  BP 122/72   Pulse 88   Ht 5\' 4"  (1.626 m)   Wt 180 lb (81.6 kg)   LMP 07/07/2013   SpO2 98%   BMI 30.90 kg/m    Wt Readings from Last 3 Encounters:  07/30/22 180 lb (81.6 kg)  06/21/22 182 lb (82.6 kg)  06/13/22 180 lb (81.6 kg)     GEN: Well nourished, well developed in no acute distress NECK: No JVD; No carotid bruits CARDIAC: RRR, no murmurs, rubs, gallops RESPIRATORY:  Clear to auscultation without rales, wheezing or rhonchi  ABDOMEN: Soft, non-tender, non-distended EXTREMITIES:  No edema; No deformity   ASSESSMENT AND PLAN:   Tachycardia:   Patient tolerates beta-blocker is doing well with this.  No change in therapy.    DM: Her blood sugar is 6.7 no change in therapy.         Signed, Rollene Rotunda, MD

## 2022-07-30 ENCOUNTER — Ambulatory Visit: Payer: 59 | Attending: Cardiology | Admitting: Cardiology

## 2022-07-30 ENCOUNTER — Encounter: Payer: Self-pay | Admitting: Cardiology

## 2022-07-30 VITALS — BP 122/72 | HR 88 | Ht 64.0 in | Wt 180.0 lb

## 2022-07-30 DIAGNOSIS — R000 Tachycardia, unspecified: Secondary | ICD-10-CM

## 2022-07-30 NOTE — Patient Instructions (Signed)
Medication Instructions:  No Changes In Medications at this time.   *If you need a refill on your cardiac medications before your next appointment, please call your pharmacy*  Follow-Up: At [facility], you and your health needs are our priority.  As part of our continuing mission to provide you with exceptional heart care, we have created designated Provider Care Teams.  These Care Teams include your primary Cardiologist (physician) and Advanced Practice Providers (APPs -  Physician Assistants and Nurse Practitioners) who all work together to provide you with the care you need, when you need it.  Your next appointment:   1 year(s)  Provider:   James Hochrein, MD   

## 2022-08-09 ENCOUNTER — Encounter (INDEPENDENT_AMBULATORY_CARE_PROVIDER_SITE_OTHER): Payer: 59 | Admitting: Ophthalmology

## 2022-08-09 DIAGNOSIS — H35033 Hypertensive retinopathy, bilateral: Secondary | ICD-10-CM | POA: Diagnosis not present

## 2022-08-09 DIAGNOSIS — H353221 Exudative age-related macular degeneration, left eye, with active choroidal neovascularization: Secondary | ICD-10-CM

## 2022-08-09 DIAGNOSIS — H43813 Vitreous degeneration, bilateral: Secondary | ICD-10-CM

## 2022-08-09 DIAGNOSIS — I10 Essential (primary) hypertension: Secondary | ICD-10-CM

## 2022-08-09 DIAGNOSIS — H2513 Age-related nuclear cataract, bilateral: Secondary | ICD-10-CM | POA: Diagnosis not present

## 2022-08-09 DIAGNOSIS — H353112 Nonexudative age-related macular degeneration, right eye, intermediate dry stage: Secondary | ICD-10-CM | POA: Diagnosis not present

## 2022-08-15 ENCOUNTER — Other Ambulatory Visit: Payer: Self-pay | Admitting: Primary Care

## 2022-08-15 DIAGNOSIS — R000 Tachycardia, unspecified: Secondary | ICD-10-CM

## 2022-09-16 DIAGNOSIS — Z6830 Body mass index (BMI) 30.0-30.9, adult: Secondary | ICD-10-CM | POA: Diagnosis not present

## 2022-09-16 DIAGNOSIS — N3000 Acute cystitis without hematuria: Secondary | ICD-10-CM | POA: Diagnosis not present

## 2022-09-25 ENCOUNTER — Other Ambulatory Visit: Payer: Self-pay | Admitting: Primary Care

## 2022-09-25 DIAGNOSIS — E119 Type 2 diabetes mellitus without complications: Secondary | ICD-10-CM

## 2022-09-25 NOTE — Telephone Encounter (Signed)
Patient is due for CPE/follow up in mid November, this will be required prior to any further refills.  Please schedule, thank you!

## 2022-09-25 NOTE — Telephone Encounter (Signed)
Patient has been scheduled

## 2022-09-29 ENCOUNTER — Other Ambulatory Visit: Payer: Self-pay | Admitting: Primary Care

## 2022-09-29 DIAGNOSIS — E1165 Type 2 diabetes mellitus with hyperglycemia: Secondary | ICD-10-CM

## 2022-10-01 ENCOUNTER — Encounter (INDEPENDENT_AMBULATORY_CARE_PROVIDER_SITE_OTHER): Payer: 59 | Admitting: Ophthalmology

## 2022-10-01 DIAGNOSIS — I10 Essential (primary) hypertension: Secondary | ICD-10-CM | POA: Diagnosis not present

## 2022-10-01 DIAGNOSIS — H353221 Exudative age-related macular degeneration, left eye, with active choroidal neovascularization: Secondary | ICD-10-CM | POA: Diagnosis not present

## 2022-10-01 DIAGNOSIS — H43813 Vitreous degeneration, bilateral: Secondary | ICD-10-CM

## 2022-10-01 DIAGNOSIS — H2513 Age-related nuclear cataract, bilateral: Secondary | ICD-10-CM

## 2022-10-01 DIAGNOSIS — H353112 Nonexudative age-related macular degeneration, right eye, intermediate dry stage: Secondary | ICD-10-CM | POA: Diagnosis not present

## 2022-10-01 DIAGNOSIS — H35033 Hypertensive retinopathy, bilateral: Secondary | ICD-10-CM

## 2022-11-17 ENCOUNTER — Other Ambulatory Visit: Payer: Self-pay | Admitting: Primary Care

## 2022-11-17 DIAGNOSIS — R000 Tachycardia, unspecified: Secondary | ICD-10-CM

## 2022-11-20 ENCOUNTER — Encounter (INDEPENDENT_AMBULATORY_CARE_PROVIDER_SITE_OTHER): Payer: 59 | Admitting: Ophthalmology

## 2022-11-22 IMAGING — DX DG CHEST 1V PORT
1 series · 1 of 1 positions shown · non-contrast
Comparison: None.

CLINICAL DATA: Palpitations, hypertension, tachycardia.

EXAM:
PORTABLE CHEST 1 VIEW

[chest ap]
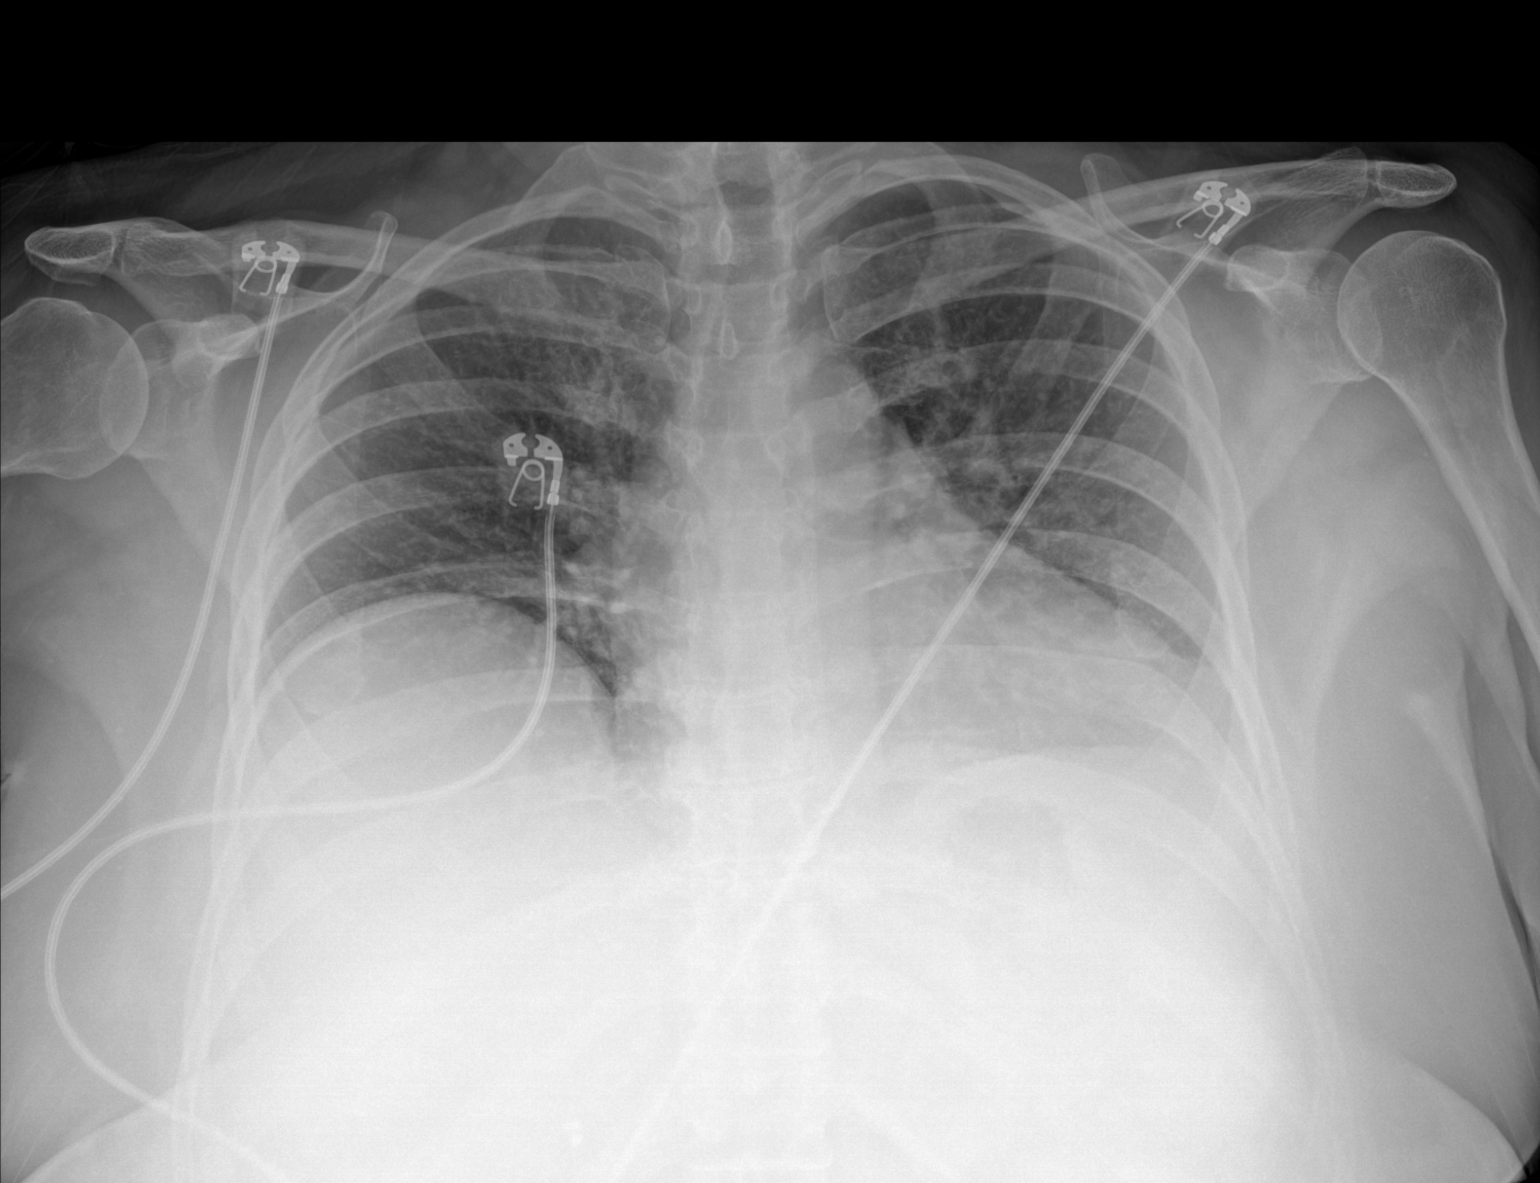

[1 of 1 positions shown; findings below may reference images not displayed]

FINDINGS: The heart size and mediastinal contours are within normal limits.
Low lung volumes without evidence of focal consolidation or pleural
effusion. Elevation of the right hemidiaphragm. No acute osseous
abnormality. The visualized skeletal structures are unremarkable.
IMPRESSION: Low lung volumes without evidence of focal consolidation or pleural
effusion.

## 2022-11-27 ENCOUNTER — Encounter (INDEPENDENT_AMBULATORY_CARE_PROVIDER_SITE_OTHER): Payer: 59 | Admitting: Ophthalmology

## 2022-11-27 DIAGNOSIS — H43813 Vitreous degeneration, bilateral: Secondary | ICD-10-CM

## 2022-11-27 DIAGNOSIS — H353112 Nonexudative age-related macular degeneration, right eye, intermediate dry stage: Secondary | ICD-10-CM

## 2022-11-27 DIAGNOSIS — H35033 Hypertensive retinopathy, bilateral: Secondary | ICD-10-CM | POA: Diagnosis not present

## 2022-11-27 DIAGNOSIS — H353221 Exudative age-related macular degeneration, left eye, with active choroidal neovascularization: Secondary | ICD-10-CM | POA: Diagnosis not present

## 2022-11-27 DIAGNOSIS — I10 Essential (primary) hypertension: Secondary | ICD-10-CM | POA: Diagnosis not present

## 2022-12-24 ENCOUNTER — Other Ambulatory Visit: Payer: Self-pay | Admitting: Primary Care

## 2022-12-24 DIAGNOSIS — E1165 Type 2 diabetes mellitus with hyperglycemia: Secondary | ICD-10-CM

## 2022-12-28 ENCOUNTER — Other Ambulatory Visit: Payer: Self-pay | Admitting: Primary Care

## 2022-12-28 DIAGNOSIS — E119 Type 2 diabetes mellitus without complications: Secondary | ICD-10-CM

## 2022-12-30 ENCOUNTER — Other Ambulatory Visit: Payer: Self-pay | Admitting: Primary Care

## 2022-12-30 DIAGNOSIS — E785 Hyperlipidemia, unspecified: Secondary | ICD-10-CM

## 2022-12-30 DIAGNOSIS — I10 Essential (primary) hypertension: Secondary | ICD-10-CM

## 2023-01-11 ENCOUNTER — Other Ambulatory Visit: Payer: Self-pay | Admitting: Primary Care

## 2023-01-11 DIAGNOSIS — I10 Essential (primary) hypertension: Secondary | ICD-10-CM

## 2023-01-12 ENCOUNTER — Other Ambulatory Visit: Payer: Self-pay | Admitting: Primary Care

## 2023-01-12 DIAGNOSIS — E039 Hypothyroidism, unspecified: Secondary | ICD-10-CM

## 2023-01-14 ENCOUNTER — Encounter: Payer: Self-pay | Admitting: Primary Care

## 2023-01-14 ENCOUNTER — Ambulatory Visit: Payer: 59 | Admitting: Primary Care

## 2023-01-14 VITALS — BP 120/60 | HR 85 | Temp 98.6°F | Ht 64.0 in | Wt 181.0 lb

## 2023-01-14 DIAGNOSIS — E039 Hypothyroidism, unspecified: Secondary | ICD-10-CM

## 2023-01-14 DIAGNOSIS — E559 Vitamin D deficiency, unspecified: Secondary | ICD-10-CM | POA: Diagnosis not present

## 2023-01-14 DIAGNOSIS — G8929 Other chronic pain: Secondary | ICD-10-CM | POA: Diagnosis not present

## 2023-01-14 DIAGNOSIS — A6000 Herpesviral infection of urogenital system, unspecified: Secondary | ICD-10-CM | POA: Diagnosis not present

## 2023-01-14 DIAGNOSIS — M546 Pain in thoracic spine: Secondary | ICD-10-CM | POA: Diagnosis not present

## 2023-01-14 DIAGNOSIS — Z0000 Encounter for general adult medical examination without abnormal findings: Secondary | ICD-10-CM | POA: Diagnosis not present

## 2023-01-14 DIAGNOSIS — Z23 Encounter for immunization: Secondary | ICD-10-CM | POA: Diagnosis not present

## 2023-01-14 DIAGNOSIS — E785 Hyperlipidemia, unspecified: Secondary | ICD-10-CM

## 2023-01-14 DIAGNOSIS — K7581 Nonalcoholic steatohepatitis (NASH): Secondary | ICD-10-CM

## 2023-01-14 DIAGNOSIS — I10 Essential (primary) hypertension: Secondary | ICD-10-CM

## 2023-01-14 DIAGNOSIS — R000 Tachycardia, unspecified: Secondary | ICD-10-CM

## 2023-01-14 DIAGNOSIS — E1165 Type 2 diabetes mellitus with hyperglycemia: Secondary | ICD-10-CM

## 2023-01-14 DIAGNOSIS — H3530 Unspecified macular degeneration: Secondary | ICD-10-CM

## 2023-01-14 DIAGNOSIS — Z7984 Long term (current) use of oral hypoglycemic drugs: Secondary | ICD-10-CM

## 2023-01-14 LAB — COMPREHENSIVE METABOLIC PANEL
ALT: 14 U/L (ref 0–35)
AST: 15 U/L (ref 0–37)
Albumin: 4.2 g/dL (ref 3.5–5.2)
Alkaline Phosphatase: 74 U/L (ref 39–117)
BUN: 15 mg/dL (ref 6–23)
CO2: 28 meq/L (ref 19–32)
Calcium: 9.2 mg/dL (ref 8.4–10.5)
Chloride: 105 meq/L (ref 96–112)
Creatinine, Ser: 0.78 mg/dL (ref 0.40–1.20)
GFR: 80.7 mL/min (ref >60.00)
Glucose, Bld: 126 mg/dL — ABNORMAL HIGH (ref 70–99)
Potassium: 4.5 meq/L (ref 3.5–5.1)
Sodium: 141 meq/L (ref 135–145)
Total Bilirubin: 0.4 mg/dL (ref 0.2–1.2)
Total Protein: 6.2 g/dL (ref 6.0–8.3)

## 2023-01-14 LAB — VITAMIN D 25 HYDROXY (VIT D DEFICIENCY, FRACTURES): VITD: 19.02 ng/mL — ABNORMAL LOW (ref 30.00–100.00)

## 2023-01-14 LAB — MICROALBUMIN / CREATININE URINE RATIO
Creatinine,U: 105.9 mg/dL
Microalb Creat Ratio: 0.9 mg/g (ref 0.0–30.0)
Microalb, Ur: 0.9 mg/dL (ref 0.0–1.9)

## 2023-01-14 LAB — LIPID PANEL
Cholesterol: 109 mg/dL (ref 0–200)
HDL: 36 mg/dL — ABNORMAL LOW (ref >39.00)
LDL Cholesterol: 32 mg/dL (ref 0–99)
NonHDL: 73.47
Total CHOL/HDL Ratio: 3
Triglycerides: 205 mg/dL — ABNORMAL HIGH (ref 0.0–149.0)
VLDL: 41 mg/dL — ABNORMAL HIGH (ref 0.0–40.0)

## 2023-01-14 LAB — TSH: TSH: 2.14 u[IU]/mL (ref 0.35–5.50)

## 2023-01-14 LAB — HEMOGLOBIN A1C: Hgb A1c MFr Bld: 6.5 % (ref 4.6–6.5)

## 2023-01-14 NOTE — Assessment & Plan Note (Signed)
She is taking levothyroxine correctly.  Continue levothyroxine 75 mcg daily. Repeat TSH pending. 

## 2023-01-14 NOTE — Assessment & Plan Note (Signed)
Repeat vitamin D level pending.  Continue vitamin D 5000 IU capsules daily.

## 2023-01-14 NOTE — Assessment & Plan Note (Signed)
Controlled.  Continue lisinopril 20 mg daily and amlodipine 5 mg daily.  CMP pending.

## 2023-01-14 NOTE — Assessment & Plan Note (Signed)
Following with ophthalmology regularly.  Continue Alphagan 0.2% drops and regular injections.

## 2023-01-14 NOTE — Progress Notes (Signed)
Subjective:    Patient ID: Courtney Burns, female    DOB: 1960-04-05, 62 y.o.   MRN: [id number]  HPI  Courtney Burns is a very pleasant 62 y.o. female with a history of hypertension, type 2 diabetes, hyperlipidemia, vitamin D deficiency, hypothyroidism, NASH who presents today for complete physical and follow up of chronic conditions.  Immunizations: -Tetanus: Completed in 2018 -Influenza: Due today -Shingles: Completed Shingrix series -Pneumonia: Completed in 2018  Diet: Fair diet.  Exercise: No regular exercise.  Eye exam: Completes annually  Dental exam: Completes semi-annually    Pap Smear: Completed in 2022 Mammogram: Completed in April 2024  Colonoscopy: Completed in 2017, due 2027  BP Readings from Last 3 Encounters:  01/14/23 120/60  07/30/22 122/72  06/30/22 131/84    Wt Readings from Last 3 Encounters:  01/14/23 181 lb (82.1 kg)  07/30/22 180 lb (81.6 kg)  06/21/22 182 lb (82.6 kg)      Review of Systems  Constitutional:  Negative for unexpected weight change.  HENT:  Negative for rhinorrhea.   Respiratory:  Negative for cough and shortness of breath.   Cardiovascular:  Negative for chest pain.  Gastrointestinal:  Negative for constipation and diarrhea.  Genitourinary:  Negative for difficulty urinating.  Musculoskeletal:  Negative for arthralgias and myalgias.  Skin:  Negative for rash.  Allergic/Immunologic: Negative for environmental allergies.  Neurological:  Negative for dizziness, numbness and headaches.  Psychiatric/Behavioral:  The patient is not nervous/anxious.          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    GERD (gastroesophageal reflux disease)    Hx - diet controlled,  no meds   History of Papanicolaou smear of cervix 08/2013   neg per pt   HSV infection    Hypertension     Hyperthyroidism    Hypothyroidism    had radiation then thyroid became low   Ileitis    Macular degeneration of left eye    Vitamin D deficiency     Social History   Socioeconomic History   Marital status: Married    Spouse name: Not on file   Number of children: 0   Years of education: 16   Highest education level: Not on file  Occupational History   Occupation: Engineering geologist: [employer]    Comment: Pension scheme manager  Tobacco Use   Smoking status: Never   Smokeless tobacco: Never  Vaping Use   Vaping status: Never Used  Substance and Sexual Activity   Alcohol use: No    Alcohol/week: 0.0 standard drinks of alcohol   Drug use: No   Sexual activity: Not Currently    Partners: Male    Birth control/protection: Post-menopausal  Other Topics Concern   Not on file  Social History Narrative   Lives in [place].    Works for [employer].   [place] grew up in [place]. She lives in [place] with her husband Aurther Loft) and their 1cat    She attended [employer] and obtained her Bachelors in [employer] with a minor in Building services engineer.    She loves cooking and sewing.   Social Determinants of Health   Financial Resource Strain: Not on file  Food Insecurity: Not on file  Transportation Needs: Not on file  Physical Activity:  Not on file  Stress: Not on file  Social Connections: Not on file  Intimate Partner Violence: Not on file    Past Surgical History:  Procedure Laterality Date   APPENDECTOMY  1970   BUNIONECTOMY Right 07/2001   right foot   CHOLECYSTECTOMY  2005   COLONOSCOPY  01/2004; 06/2015   Dr. Luellen Pucker, [place] - polyp; nl   ECTOPIC PREGNANCY SURGERY  1995   left salpingectomy   RIGHT OOPHORECTOMY Right 03/2003   Abscessed cyst removed   WISDOM TOOTH EXTRACTION      Family History  Problem Relation Age of Onset   Hypertension Mother    Diabetes Mother    Cancer Mother 43       GBM    Hyperlipidemia Father    Heart disease Father 47       MI   Hypertension Father    Diabetes Father    Depression Father    COPD Father    Stroke Maternal Grandmother    Liver cancer Maternal Grandfather    Cancer Maternal Grandfather 71       lung   Heart disease Paternal Grandmother        MI   Diabetes Paternal Grandmother    Prostate cancer Paternal Grandfather 79   Cancer - Prostate Paternal Grandfather    Diabetes Maternal Aunt        Uncontrolled with complications   Kidney disease Maternal Aunt        Renal failure - dialysis   Cancer Maternal Uncle 42       brain tumor   Colon cancer Neg Hx    Esophageal cancer Neg Hx    Rectal cancer Neg Hx    Stomach cancer Neg Hx    Breast cancer Neg Hx     Allergies  Allergen Reactions   Glipizide Itching   Sulfa Antibiotics Rash    Current Outpatient Medications on File Prior to Visit  Medication Sig Dispense Refill   amLODipine (NORVASC) 5 MG tablet TAKE 1 TABLET BY MOUTH EVERY DAY FOR BLOOD PRESSURE 90 tablet 0   brimonidine (ALPHAGAN) 0.2 % ophthalmic solution Place 1 drop into the left eye 2 (two) times daily.     CONTOUR NEXT TEST test strip USE AS INSTRUCTED TO TEST BLOOD SUGAR UP TO 3 TIMES DAILY 100 strip 5   levothyroxine (SYNTHROID) 75 MCG tablet TAKE 1 TAB EVERY AM ON EMPTY STOMACH W/WATER ONLY. NO FOOD OR OTHER MEDICATIONS FOR 30 MINUTES. 90 tablet 0   lisinopril (ZESTRIL) 20 MG tablet TAKE 1 TABLET (20 MG TOTAL) BY MOUTH DAILY. FOR BLOOD PRESSURE. 90 tablet 0   metFORMIN (GLUCOPHAGE) 1000 MG tablet TAKE 1 TABLET (1,000 MG TOTAL) BY MOUTH 2 (TWO) TIMES DAILY WITH A MEAL. FOR DIABETES. 180 tablet 0   metoprolol tartrate (LOPRESSOR) 25 MG tablet TAKE 0.5 TABLETS (12.5 MG TOTAL) BY MOUTH 2 (TWO) TIMES DAILY. 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 in [facility]     rosuvastatin (CRESTOR) 10 MG  tablet TAKE 1 TABLET BY MOUTH DAILY. FOR CHOLESTEROL 90 tablet 0   Semaglutide, 1 MG/DOSE, (OZEMPIC, 1 MG/DOSE,) 4 MG/3ML SOPN INJECT 1 MG AS DIRECTED ONCE A WEEK. FOR DIABETES. 9 mL 0   valACYclovir (VALTREX) 500 MG tablet TAKE 1 TABLET BY MOUTH TWICE A DAY 6 tablet 0   No current facility-administered medications  on file prior to visit.    BP 120/60   Pulse 85   Temp 98.6 F (37 C) (Oral)   Ht 5\' 4"  (1.626 m)   Wt 181 lb (82.1 kg)   LMP 07/07/2013   SpO2 100%   BMI 31.07 kg/m  Objective:   Physical Exam HENT:     Right Ear: Tympanic membrane and ear canal normal.     Left Ear: Tympanic membrane and ear canal normal.  Eyes:     Pupils: Pupils are equal, round, and reactive to light.  Cardiovascular:     Rate and Rhythm: Normal rate and regular rhythm.  Pulmonary:     Effort: Pulmonary effort is normal.     Breath sounds: Normal breath sounds.  Abdominal:     General: Bowel sounds are normal.     Palpations: Abdomen is soft.     Tenderness: There is no abdominal tenderness.  Musculoskeletal:        General: Normal range of motion.     Cervical back: Neck supple.  Skin:    General: Skin is warm and dry.  Neurological:     Mental Status: She is alert and oriented to person, place, and time.     Cranial Nerves: No cranial nerve deficit.     Deep Tendon Reflexes:     Reflex Scores:      Patellar reflexes are 2+ on the right side and 2+ on the left side. Psychiatric:        Mood and Affect: Mood normal.           Assessment & Plan:  Annual physical exam Assessment & Plan: Immunizations UTD. Influenza vaccine provided today.  Pap smear UTD. Mammogram due, orders placed. Colonoscopy UTD, due 2027  Discussed the importance of a healthy diet and regular exercise in order for weight loss, and to reduce the risk of further co-morbidity.  Exam stable. Labs pending.  Follow up in 1 year for repeat physical.    Essential hypertension Assessment &  Plan: Controlled.  Continue lisinopril 20 mg daily and amlodipine 5 mg daily.  CMP pending.   NASH (nonalcoholic steatohepatitis) Assessment & Plan: Repeat CMP pending.    Hypothyroidism, unspecified type Assessment & Plan: She is taking levothyroxine correctly.  Continue levothyroxine 75 mcg daily.  Repeat TSH pending.  Orders: -     TSH  Type 2 diabetes mellitus with hyperglycemia, without long-term current use of insulin (HCC) Assessment & Plan: Repeat A1C pending.  Continue metformin 1000 mg BID and Ozempic 1 mg weekly. Urine microalbumin pending. Foot exam completed today.  Follow up in 3-6 months based on A1C result.   Orders: -     Hemoglobin A1c -     Microalbumin / creatinine urine ratio  Genital herpes simplex, unspecified site Assessment & Plan: Infrequent outbreaks.   Continue Valrrex 500 mg twice daily as needed.   Chronic right-sided thoracic back pain Assessment & Plan: No concerns today. Continue to monitor.   Hyperlipidemia, unspecified hyperlipidemia type Assessment & Plan: Repeat lipid panel pending. Continue rosuvastatin 10 mg daily.  Orders: -     Lipid panel -     Comprehensive metabolic panel  Macular degeneration, unspecified laterality, unspecified type Assessment & Plan: Following with ophthalmology regularly.  Continue Alphagan 0.2% drops and regular injections.   Tachycardia Assessment & Plan: Controlled.  Continue metoprolol to tartrate 12.5 mg twice daily.   Vitamin D deficiency Assessment & Plan: Repeat vitamin D level pending.  Continue  vitamin D 5000 IU capsules daily.  Orders: -     VITAMIN D 25 Hydroxy (Vit-D Deficiency, Fractures)        Doreene Nest, NP

## 2023-01-14 NOTE — Assessment & Plan Note (Signed)
No concerns today. Continue to monitor. 

## 2023-01-14 NOTE — Assessment & Plan Note (Signed)
Controlled.  Continue metoprolol to tartrate 12.5 mg twice daily.

## 2023-01-14 NOTE — Assessment & Plan Note (Signed)
Infrequent outbreaks.   Continue Valrrex 500 mg twice daily as needed.

## 2023-01-14 NOTE — Assessment & Plan Note (Signed)
Repeat CMP pending. 

## 2023-01-14 NOTE — Assessment & Plan Note (Signed)
Repeat lipid panel pending. Continue rosuvastatin 10 mg daily. 

## 2023-01-14 NOTE — Assessment & Plan Note (Addendum)
Repeat A1C pending.  Continue metformin 1000 mg BID and Ozempic 1 mg weekly. Urine microalbumin pending. Foot exam completed today.  Follow up in 3-6 months based on A1C result.

## 2023-01-14 NOTE — Patient Instructions (Addendum)
Stop by the lab prior to leaving today. I will notify you of your results once received.   Please schedule a follow up visit for 6 months for a diabetes check.  It was a pleasure to see you today!   

## 2023-01-14 NOTE — Assessment & Plan Note (Signed)
Immunizations UTD. Influenza vaccine provided today.  Pap smear UTD. Mammogram due, orders placed. Colonoscopy UTD, due 2027  Discussed the importance of a healthy diet and regular exercise in order for weight loss, and to reduce the risk of further co-morbidity.  Exam stable. Labs pending.  Follow up in 1 year for repeat physical.

## 2023-01-15 ENCOUNTER — Encounter (INDEPENDENT_AMBULATORY_CARE_PROVIDER_SITE_OTHER): Payer: 59 | Admitting: Ophthalmology

## 2023-01-15 DIAGNOSIS — H35033 Hypertensive retinopathy, bilateral: Secondary | ICD-10-CM | POA: Diagnosis not present

## 2023-01-15 DIAGNOSIS — H353112 Nonexudative age-related macular degeneration, right eye, intermediate dry stage: Secondary | ICD-10-CM

## 2023-01-15 DIAGNOSIS — H43813 Vitreous degeneration, bilateral: Secondary | ICD-10-CM | POA: Diagnosis not present

## 2023-01-15 DIAGNOSIS — H353221 Exudative age-related macular degeneration, left eye, with active choroidal neovascularization: Secondary | ICD-10-CM

## 2023-01-15 DIAGNOSIS — I10 Essential (primary) hypertension: Secondary | ICD-10-CM

## 2023-01-15 MED ORDER — VITAMIN D (ERGOCALCIFEROL) 1.25 MG (50000 UNIT) PO CAPS
ORAL_CAPSULE | ORAL | 0 refills | Status: DC
Start: 2023-01-15 — End: 2023-04-07

## 2023-01-16 ENCOUNTER — Encounter (INDEPENDENT_AMBULATORY_CARE_PROVIDER_SITE_OTHER): Payer: 59 | Admitting: Ophthalmology

## 2023-02-18 ENCOUNTER — Other Ambulatory Visit: Payer: Self-pay | Admitting: Primary Care

## 2023-02-18 DIAGNOSIS — R000 Tachycardia, unspecified: Secondary | ICD-10-CM

## 2023-02-26 ENCOUNTER — Encounter (INDEPENDENT_AMBULATORY_CARE_PROVIDER_SITE_OTHER): Payer: 59 | Admitting: Ophthalmology

## 2023-02-26 DIAGNOSIS — H353112 Nonexudative age-related macular degeneration, right eye, intermediate dry stage: Secondary | ICD-10-CM | POA: Diagnosis not present

## 2023-02-26 DIAGNOSIS — I10 Essential (primary) hypertension: Secondary | ICD-10-CM | POA: Diagnosis not present

## 2023-02-26 DIAGNOSIS — H353221 Exudative age-related macular degeneration, left eye, with active choroidal neovascularization: Secondary | ICD-10-CM | POA: Diagnosis not present

## 2023-02-26 DIAGNOSIS — H43813 Vitreous degeneration, bilateral: Secondary | ICD-10-CM | POA: Diagnosis not present

## 2023-02-26 DIAGNOSIS — H35033 Hypertensive retinopathy, bilateral: Secondary | ICD-10-CM

## 2023-02-28 ENCOUNTER — Encounter (INDEPENDENT_AMBULATORY_CARE_PROVIDER_SITE_OTHER): Payer: 59 | Admitting: Ophthalmology

## 2023-03-25 ENCOUNTER — Other Ambulatory Visit: Payer: Self-pay | Admitting: Primary Care

## 2023-03-25 DIAGNOSIS — E1165 Type 2 diabetes mellitus with hyperglycemia: Secondary | ICD-10-CM

## 2023-03-25 NOTE — Progress Notes (Signed)
Courtney Nest, NP   Chief Complaint  Patient presents with   Vaginal Dryness    Thin skin?, vaginal itching, frequent UTI (no sx today)    HPI:      [name] is a 63 y.o. G2P0020 whose LMP was Patient's last menstrual period was 07/07/2013., presents today for vaginal itching, occas tear, without increased vag d/c or odor. Using dove sens skin soap, no dryer sheets, synthetic underwear, no pads, uses cottonelle unscented wipes. Feels dry bilat labia majora with itching in hairline as well. Not shaving. Been treating with replens. Had itching in 2022 and treated with lotrisone crm with sx relief; no sx till now and sx feel different.  Had bad UTI about a year ago with tx. Has had several since then and sometimes UTI sx that she can improve with D-mannose supp. Has not done vag ERT.  She is not sexually active. No PMB. Does have VS sx. Neg pap 3/22.  Patient Active Problem List   Diagnosis Date Noted   Tachycardia 03/12/2022   Vaginal itching 05/25/2020   Chronic right-sided thoracic back pain 12/03/2019   Macular degeneration 08/11/2019   Genital herpes 08/11/2019   Essential hypertension 12/28/2016   Elevated liver function tests 05/23/2015   Menopausal disorder 05/16/2015   Vitamin D deficiency 05/16/2015   Solar dermatitis 10/04/2014   NASH (nonalcoholic steatohepatitis) 02/07/2014   Annual physical exam 08/19/2013   Hypothyroidism 08/19/2013   HLD (hyperlipidemia) 08/19/2013   Type 2 diabetes mellitus (HCC) 08/19/2013   Obesity 07/22/2013    Past Surgical History:  Procedure Laterality Date   APPENDECTOMY  1970   BUNIONECTOMY Right 07/2001   right foot   CHOLECYSTECTOMY  2005   COLONOSCOPY  01/2004; 06/2015   Dr. Luellen Pucker, [place] - polyp; nl   ECTOPIC PREGNANCY SURGERY  1995   left salpingectomy   RIGHT OOPHORECTOMY Right 03/2003   Abscessed cyst removed   WISDOM TOOTH EXTRACTION      Family History  Problem Relation Age of Onset    Hypertension Mother    Diabetes Mother    Cancer Mother 39       GBM   Hyperlipidemia Father    Heart disease Father 89       MI   Hypertension Father    Diabetes Father    Depression Father    COPD Father    Stroke Maternal Grandmother    Liver cancer Maternal Grandfather    Cancer Maternal Grandfather 8       lung   Heart disease Paternal Grandmother        MI   Diabetes Paternal Grandmother    Prostate cancer Paternal Grandfather 36   Cancer - Prostate Paternal Grandfather    Diabetes Maternal Aunt        Uncontrolled with complications   Kidney disease Maternal Aunt        Renal failure - dialysis   Cancer Maternal Uncle 42       brain tumor   Colon cancer Neg Hx    Esophageal cancer Neg Hx    Rectal cancer Neg Hx    Stomach cancer Neg Hx    Breast cancer Neg Hx     Social History   Socioeconomic History   Marital status: Married    Spouse name: Not on file   Number of children: 0   Years of education: 16   Highest education level: Not on file  Occupational History  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    Works for [employer].   Courtney Burns grew up in [place]. She lives in [place] with her husband Courtney Burns) and their 1cat    She attended [employer] and obtained her Bachelors in [employer] with a minor in Building services engineer.    She loves cooking and sewing.   Social Drivers of Corporate investment banker Strain: Not on file  Food Insecurity: Not on file   Transportation Needs: Not on file  Physical Activity: Not on file  Stress: Not on file  Social Connections: Not on file  Intimate Partner Violence: Not on file    Outpatient Medications Prior to Visit  Medication Sig Dispense Refill   amLODipine (NORVASC) 5 MG tablet TAKE 1 TABLET BY MOUTH EVERY DAY FOR BLOOD PRESSURE 90 tablet 0   brimonidine (ALPHAGAN) 0.2 % ophthalmic solution Place 1 drop into the left eye 2 (two) times daily.     ciprofloxacin (CILOXAN) 0.3 % ophthalmic solution INSTILL ONE DROP INTO LEFT EYE 4 TIMES A DAY FOR 2 DAYS AFTER EACH MONTHLY EYE INJECTION     CONTOUR NEXT TEST test strip USE AS INSTRUCTED TO TEST BLOOD SUGAR UP TO 3 TIMES DAILY 100 strip 5   levothyroxine (SYNTHROID) 75 MCG tablet TAKE 1 TAB EVERY AM ON EMPTY STOMACH W/WATER ONLY. NO FOOD OR OTHER MEDICATIONS FOR 30 MINUTES. 90 tablet 0   lisinopril (ZESTRIL) 20 MG tablet TAKE 1 TABLET (20 MG TOTAL) BY MOUTH DAILY. FOR BLOOD PRESSURE. 90 tablet 0   metFORMIN (GLUCOPHAGE) 1000 MG tablet TAKE 1 TABLET (1,000 MG TOTAL) BY MOUTH 2 (TWO) TIMES DAILY WITH A MEAL. FOR DIABETES. 180 tablet 0   metoprolol tartrate (LOPRESSOR) 25 MG tablet TAKE 0.5 TABLETS (12.5 MG TOTAL) BY MOUTH 2 (TWO) TIMES DAILY. 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 FOR CHOLESTEROL 90 tablet 0   Semaglutide, 1 MG/DOSE, (OZEMPIC, 1 MG/DOSE,) 4 MG/3ML SOPN INJECT 1 MG AS DIRECTED ONCE A WEEK. FOR DIABETES. 9 mL 0   valACYclovir (VALTREX) 500 MG tablet TAKE 1 TABLET BY MOUTH TWICE A DAY 6 tablet 0   Vitamin D, Ergocalciferol, (DRISDOL) 1.25 MG (50000 UNIT) CAPS capsule Take 1 capsule by mouth once weekly for 12 weeks. 12 capsule 0   No facility-administered medications prior to visit.      ROS:  Review of Systems  Constitutional:   Negative for fever.  Gastrointestinal:  Negative for blood in stool, constipation, diarrhea, nausea and vomiting.  Genitourinary:  Negative for dyspareunia, dysuria, flank pain, frequency, hematuria, urgency, vaginal bleeding, vaginal discharge and vaginal pain.  Musculoskeletal:  Negative for back pain.  Skin:  Negative for rash.   BREAST: No symptoms  OBJECTIVE:   Vitals:  BP 130/78   Pulse 73   Ht 5\' 4"  (1.626 m)   Wt 182 lb (82.6 kg)   LMP 07/07/2013   BMI 31.24 kg/m   Physical Exam Vitals reviewed.  Constitutional:      Appearance: She is well-developed.  Pulmonary:     Effort: Pulmonary effort is normal.  Genitourinary:    Labia:        Right: Rash present. No tenderness or lesion.        Left: Rash and lesion present. No tenderness.        Comments: NO GOOD AREA TO BX Musculoskeletal:        General: Normal range of motion.     Cervical back: Normal range of motion.  Skin:    General: Skin is warm and dry.  Neurological:     General: No focal deficit present.     Mental Status: She is alert and oriented to person, place, and time.     Cranial Nerves: No cranial nerve deficit.  Psychiatric:        Mood and Affect: Mood normal.        Behavior: Behavior normal.        Thought Content: Thought content normal.        Judgment: Judgment normal.     Assessment/Plan: Vaginal itching--bilat labia majora with areas of chronic irritation. Treat with OTC hydrocortisone crm once daily till 4 wk f/u. Trim hair in case it's trapping moisture. Cotton underwear, unscented baby wipes prn.   Lichen sclerosus - Plan: clobetasol ointment (TEMOVATE) 0.05 %; presumed LS bilat labia minora and post fourchette on exam but no good area to bx. Treat empirically for now with clobetasol at bedtime for 4 wks, then RTO for f/u. Will bx if sx persist.  May do well to add vag ERT--will re-eval at f/u.    Meds ordered this encounter  Medications   clobetasol ointment (TEMOVATE) 0.05  %    Sig: Apply to affected area every night for 4 weeks    Dispense:  30 g    Refill:  0      Return in about 4 weeks (around 04/24/2023) for vaginitis f/u.  Othman Masur B. Chapman Matteucci, PA-C 03/27/2023 12:06 PM

## 2023-03-27 ENCOUNTER — Encounter: Payer: Self-pay | Admitting: Obstetrics and Gynecology

## 2023-03-27 ENCOUNTER — Ambulatory Visit: Payer: 59 | Admitting: Obstetrics and Gynecology

## 2023-03-27 VITALS — BP 130/78 | HR 73 | Ht 64.0 in | Wt 182.0 lb

## 2023-03-27 DIAGNOSIS — N898 Other specified noninflammatory disorders of vagina: Secondary | ICD-10-CM

## 2023-03-27 DIAGNOSIS — N951 Menopausal and female climacteric states: Secondary | ICD-10-CM

## 2023-03-27 DIAGNOSIS — L900 Lichen sclerosus et atrophicus: Secondary | ICD-10-CM | POA: Diagnosis not present

## 2023-03-27 MED ORDER — CLOBETASOL PROPIONATE 0.05 % EX OINT
TOPICAL_OINTMENT | CUTANEOUS | 0 refills | Status: DC
Start: 2023-03-27 — End: 2023-04-24

## 2023-04-03 ENCOUNTER — Other Ambulatory Visit: Payer: Self-pay | Admitting: Primary Care

## 2023-04-03 DIAGNOSIS — E559 Vitamin D deficiency, unspecified: Secondary | ICD-10-CM

## 2023-04-03 NOTE — Telephone Encounter (Signed)
Patient needs lab only appointment to recheck vitamin D level. Does not have to be fasting. Lab order placed.

## 2023-04-03 NOTE — Telephone Encounter (Signed)
lvm for pt to call office to schedule appt.  

## 2023-04-04 ENCOUNTER — Other Ambulatory Visit (INDEPENDENT_AMBULATORY_CARE_PROVIDER_SITE_OTHER): Payer: 59

## 2023-04-04 DIAGNOSIS — E559 Vitamin D deficiency, unspecified: Secondary | ICD-10-CM

## 2023-04-04 LAB — VITAMIN D 25 HYDROXY (VIT D DEFICIENCY, FRACTURES): VITD: 23.11 ng/mL — ABNORMAL LOW (ref 30.00–100.00)

## 2023-04-05 ENCOUNTER — Other Ambulatory Visit: Payer: Self-pay | Admitting: Primary Care

## 2023-04-05 DIAGNOSIS — E119 Type 2 diabetes mellitus without complications: Secondary | ICD-10-CM

## 2023-04-05 DIAGNOSIS — E785 Hyperlipidemia, unspecified: Secondary | ICD-10-CM

## 2023-04-05 DIAGNOSIS — I10 Essential (primary) hypertension: Secondary | ICD-10-CM

## 2023-04-05 MED ORDER — ROSUVASTATIN CALCIUM 5 MG PO TABS
5.0000 mg | ORAL_TABLET | Freq: Every day | ORAL | 2 refills | Status: DC
Start: 2023-04-05 — End: 2023-12-31

## 2023-04-07 MED ORDER — VITAMIN D (ERGOCALCIFEROL) 1.25 MG (50000 UNIT) PO CAPS
ORAL_CAPSULE | ORAL | 0 refills | Status: DC
Start: 2023-04-07 — End: 2024-01-20

## 2023-04-13 ENCOUNTER — Other Ambulatory Visit: Payer: Self-pay | Admitting: Primary Care

## 2023-04-13 DIAGNOSIS — I10 Essential (primary) hypertension: Secondary | ICD-10-CM

## 2023-04-13 DIAGNOSIS — E039 Hypothyroidism, unspecified: Secondary | ICD-10-CM

## 2023-04-16 ENCOUNTER — Encounter (INDEPENDENT_AMBULATORY_CARE_PROVIDER_SITE_OTHER): Payer: 59 | Admitting: Ophthalmology

## 2023-04-16 DIAGNOSIS — H35033 Hypertensive retinopathy, bilateral: Secondary | ICD-10-CM | POA: Diagnosis not present

## 2023-04-16 DIAGNOSIS — H353221 Exudative age-related macular degeneration, left eye, with active choroidal neovascularization: Secondary | ICD-10-CM | POA: Diagnosis not present

## 2023-04-16 DIAGNOSIS — H2513 Age-related nuclear cataract, bilateral: Secondary | ICD-10-CM | POA: Diagnosis not present

## 2023-04-16 DIAGNOSIS — H353112 Nonexudative age-related macular degeneration, right eye, intermediate dry stage: Secondary | ICD-10-CM | POA: Diagnosis not present

## 2023-04-16 DIAGNOSIS — I10 Essential (primary) hypertension: Secondary | ICD-10-CM | POA: Diagnosis not present

## 2023-04-16 DIAGNOSIS — H43813 Vitreous degeneration, bilateral: Secondary | ICD-10-CM | POA: Diagnosis not present

## 2023-04-22 NOTE — Progress Notes (Unsigned)
Courtney Nest, NP   No chief complaint on file.   HPI:      [name] is a 63 y.o. G2P0020 whose LMP was Patient's last menstrual period was 07/07/2013., presents today for vaginal ithcing from 1/25. Started on clobetasol for presumed LS by sx and exam, no good area to bx.   presumed LS bilat labia minora and post fourchette on exam but no good area to bx. Treat empirically for now with clobetasol at bedtime for 4 wks, then RTO for f/u. Will bx if sx persist.  May do well to add vag ERT--will re-eval at f/u.  Patient Active Problem List   Diagnosis Date Noted   Tachycardia 03/12/2022   Vaginal itching 05/25/2020   Chronic right-sided thoracic back pain 12/03/2019   Macular degeneration 08/11/2019   Genital herpes 08/11/2019   Essential hypertension 12/28/2016   Elevated liver function tests 05/23/2015   Menopausal disorder 05/16/2015   Vitamin D deficiency 05/16/2015   Solar dermatitis 10/04/2014   NASH (nonalcoholic steatohepatitis) 02/07/2014   Annual physical exam 08/19/2013   Hypothyroidism 08/19/2013   HLD (hyperlipidemia) 08/19/2013   Type 2 diabetes mellitus (HCC) 08/19/2013   Obesity 07/22/2013    Past Surgical History:  Procedure Laterality Date   APPENDECTOMY  1970   BUNIONECTOMY Right 07/2001   right foot   CHOLECYSTECTOMY  2005   COLONOSCOPY  01/2004; 06/2015   Dr. Luellen Pucker, [place] - polyp; nl   ECTOPIC PREGNANCY SURGERY  1995   left salpingectomy   RIGHT OOPHORECTOMY Right 03/2003   Abscessed cyst removed   WISDOM TOOTH EXTRACTION      Family History  Problem Relation Age of Onset   Hypertension Mother    Diabetes Mother    Cancer Mother 37       GBM   Hyperlipidemia Father    Heart disease Father 74       MI   Hypertension Father    Diabetes Father    Depression Father    COPD Father    Stroke Maternal Grandmother    Liver cancer Maternal Grandfather    Cancer Maternal Grandfather 38       lung   Heart disease  Paternal Grandmother        MI   Diabetes Paternal Grandmother    Prostate cancer Paternal Grandfather 70   Cancer - Prostate Paternal Grandfather    Diabetes Maternal Aunt        Uncontrolled with complications   Kidney disease Maternal Aunt        Renal failure - dialysis   Cancer Maternal Uncle 42       brain tumor   Colon cancer Neg Hx    Esophageal cancer Neg Hx    Rectal cancer Neg Hx    Stomach cancer Neg Hx    Breast cancer Neg Hx     Social History   Socioeconomic History   Marital status: Married    Spouse name: Not on file   Number of children: 0   Years of education: 16   Highest education level: Not on file  Occupational History   Occupation: Engineering geologist: [employer]    Comment: [employer]  Tobacco Use   Smoking status: Never   Smokeless tobacco: Never  Vaping Use   Vaping status: Never Used  Substance and Sexual Activity   Alcohol use: No    Alcohol/week: 0.0 standard drinks of alcohol  Drug use: No   Sexual activity: Not Currently    Partners: Male    Birth control/protection: Post-menopausal  Other Topics Concern   Not on file  Social History Narrative   Lives in [place].    Works for [employer].   Jaimey grew up in [place]. She lives in [place] with her husband Aurther Loft) and their 1cat    She attended [employer] and obtained her Bachelors in [employer] with a minor in Building services engineer.    She loves cooking and sewing.   Social Drivers of Corporate investment banker Strain: Not on file  Food Insecurity: Not on file  Transportation Needs: Not on file  Physical Activity: Not on file  Stress: Not on file  Social Connections: Not on file  Intimate Partner Violence: Not on file    Outpatient Medications Prior to Visit  Medication Sig Dispense Refill   amLODipine (NORVASC) 5 MG tablet TAKE 1 TABLET BY MOUTH EVERY DAY FOR BLOOD PRESSURE 90 tablet 2   brimonidine  (ALPHAGAN) 0.2 % ophthalmic solution Place 1 drop into the left eye 2 (two) times daily.     ciprofloxacin (CILOXAN) 0.3 % ophthalmic solution INSTILL ONE DROP INTO LEFT EYE 4 TIMES A DAY FOR 2 DAYS AFTER EACH MONTHLY EYE INJECTION     clobetasol ointment (TEMOVATE) 0.05 % Apply to affected area every night for 4 weeks 30 g 0   CONTOUR NEXT TEST test strip USE AS INSTRUCTED TO TEST BLOOD SUGAR UP TO 3 TIMES DAILY 100 strip 5   levothyroxine (SYNTHROID) 75 MCG tablet TAKE 1 TAB EVERY AM ON EMPTY STOMACH W/WATER ONLY. NO FOOD OR OTHER MEDICATIONS FOR 30 MINUTES. 90 tablet 2   lisinopril (ZESTRIL) 20 MG tablet TAKE 1 TABLET (20 MG TOTAL) BY MOUTH DAILY. FOR BLOOD PRESSURE. 90 tablet 2   metFORMIN (GLUCOPHAGE) 1000 MG tablet TAKE 1 TABLET (1,000 MG TOTAL) BY MOUTH 2 (TWO) TIMES DAILY WITH A MEAL. FOR DIABETES. 180 tablet 1   metoprolol tartrate (LOPRESSOR) 25 MG tablet TAKE 0.5 TABLETS (12.5 MG TOTAL) BY MOUTH 2 (TWO) TIMES DAILY. 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 for cholesterol. 90 tablet 2   Semaglutide, 1 MG/DOSE, (OZEMPIC, 1 MG/DOSE,) 4 MG/3ML SOPN INJECT 1 MG AS DIRECTED ONCE A WEEK. FOR DIABETES. 9 mL 0   valACYclovir (VALTREX) 500 MG tablet TAKE 1 TABLET BY MOUTH TWICE A DAY 6 tablet 0   Vitamin D, Ergocalciferol, (DRISDOL) 1.25 MG (50000 UNIT) CAPS capsule Take 1 capsule by mouth once weekly for 12 weeks. 12 capsule 0   No facility-administered medications prior to visit.      ROS:  Review of Systems BREAST: No symptoms   OBJECTIVE:   Vitals:  LMP 07/07/2013   Physical Exam  Results: No results found for this or any previous visit (from the past 24 hours).   Assessment/Plan: No diagnosis found.    No orders of the defined types were placed in this  encounter.     No follow-ups on file.  Brittiany Wiehe B. Jaleiyah Alas, PA-C 04/22/2023 11:59 AM

## 2023-04-24 ENCOUNTER — Ambulatory Visit (INDEPENDENT_AMBULATORY_CARE_PROVIDER_SITE_OTHER): Payer: 59 | Admitting: Obstetrics and Gynecology

## 2023-04-24 ENCOUNTER — Encounter: Payer: Self-pay | Admitting: Obstetrics and Gynecology

## 2023-04-24 VITALS — BP 138/76 | HR 76 | Ht 64.0 in | Wt 182.0 lb

## 2023-04-24 DIAGNOSIS — L900 Lichen sclerosus et atrophicus: Secondary | ICD-10-CM | POA: Diagnosis not present

## 2023-04-24 MED ORDER — CLOBETASOL PROPIONATE 0.05 % EX OINT: TOPICAL_OINTMENT | CUTANEOUS | 0 refills | Status: DC

## 2023-04-24 NOTE — Patient Instructions (Signed)
I value your feedback and you entrusting [place] with your care. If you get a [facility] [place] [facility] patient survey, I would appreciate you taking the time to let us know about your experience today. Thank you! ? ? ?

## 2023-05-21 ENCOUNTER — Other Ambulatory Visit: Payer: Self-pay | Admitting: Primary Care

## 2023-05-21 DIAGNOSIS — R000 Tachycardia, unspecified: Secondary | ICD-10-CM

## 2023-06-04 ENCOUNTER — Encounter (INDEPENDENT_AMBULATORY_CARE_PROVIDER_SITE_OTHER): Payer: 59 | Admitting: Ophthalmology

## 2023-06-04 DIAGNOSIS — I10 Essential (primary) hypertension: Secondary | ICD-10-CM | POA: Diagnosis not present

## 2023-06-04 DIAGNOSIS — H43813 Vitreous degeneration, bilateral: Secondary | ICD-10-CM | POA: Diagnosis not present

## 2023-06-04 DIAGNOSIS — H353112 Nonexudative age-related macular degeneration, right eye, intermediate dry stage: Secondary | ICD-10-CM | POA: Diagnosis not present

## 2023-06-04 DIAGNOSIS — H35033 Hypertensive retinopathy, bilateral: Secondary | ICD-10-CM

## 2023-06-04 DIAGNOSIS — H353221 Exudative age-related macular degeneration, left eye, with active choroidal neovascularization: Secondary | ICD-10-CM

## 2023-06-16 ENCOUNTER — Other Ambulatory Visit: Payer: Self-pay | Admitting: Primary Care

## 2023-06-16 DIAGNOSIS — E1165 Type 2 diabetes mellitus with hyperglycemia: Secondary | ICD-10-CM

## 2023-07-01 ENCOUNTER — Other Ambulatory Visit: Payer: Self-pay | Admitting: Primary Care

## 2023-07-01 DIAGNOSIS — Z1231 Encounter for screening mammogram for malignant neoplasm of breast: Secondary | ICD-10-CM

## 2023-07-11 ENCOUNTER — Ambulatory Visit
Admission: RE | Admit: 2023-07-11 | Discharge: 2023-07-11 | Disposition: A | Discharge status: Home / Self Care | Source: Ambulatory Visit | Attending: Primary Care | Admitting: Primary Care

## 2023-07-11 DIAGNOSIS — Z1231 Encounter for screening mammogram for malignant neoplasm of breast: Secondary | ICD-10-CM | POA: Diagnosis not present

## 2023-07-15 ENCOUNTER — Ambulatory Visit (INDEPENDENT_AMBULATORY_CARE_PROVIDER_SITE_OTHER): Payer: 59 | Admitting: Primary Care

## 2023-07-15 VITALS — BP 136/78 | HR 98 | Temp 98.6°F | Ht 64.0 in | Wt 181.0 lb

## 2023-07-15 DIAGNOSIS — E1165 Type 2 diabetes mellitus with hyperglycemia: Secondary | ICD-10-CM | POA: Diagnosis not present

## 2023-07-15 DIAGNOSIS — E559 Vitamin D deficiency, unspecified: Secondary | ICD-10-CM | POA: Diagnosis not present

## 2023-07-15 DIAGNOSIS — Z7985 Long-term (current) use of injectable non-insulin antidiabetic drugs: Secondary | ICD-10-CM | POA: Diagnosis not present

## 2023-07-15 DIAGNOSIS — Z7984 Long term (current) use of oral hypoglycemic drugs: Secondary | ICD-10-CM | POA: Diagnosis not present

## 2023-07-15 DIAGNOSIS — E119 Type 2 diabetes mellitus without complications: Secondary | ICD-10-CM

## 2023-07-15 LAB — POCT GLYCOSYLATED HEMOGLOBIN (HGB A1C): Hemoglobin A1C: 6.2 % — AB (ref 4.0–5.6)

## 2023-07-15 LAB — VITAMIN D 25 HYDROXY (VIT D DEFICIENCY, FRACTURES): VITD: 42.83 ng/mL (ref 30.00–100.00)

## 2023-07-15 MED ORDER — METFORMIN HCL 1000 MG PO TABS
500.0000 mg | ORAL_TABLET | Freq: Two times a day (BID) | ORAL | Status: AC
Start: 2023-07-15 — End: ?

## 2023-07-15 NOTE — Patient Instructions (Signed)
 Stop by the lab prior to leaving today. I will notify you of your results once received.   Reduce your metformin  to 500 mg twice daily.   Please schedule a physical to meet with me in 6 months.   It was a pleasure to see you today!

## 2023-07-15 NOTE — Progress Notes (Signed)
 Subjective:    Patient ID: Courtney Burns, female    DOB: 02/24/1961, 63 y.o.   MRN: [id number]  HPI  Courtney Burns is a very pleasant 63 y.o. female with a history of hypertension, type 2 diabetes, hypothyroidism, [name], hyperlipidemia who presents today for follow-up of diabetes. She is also due for vitamin d  recheck.   Current medications include: Metformin  1000 mg twice daily, Ozempic  1 mg weekly.   She is checking her blood glucose 2 times weekly and is getting readings of  AM fasting: 120-130s  Last A1C: 6.5 in November 2024, 6.2 today Last Eye Exam: UTD Last Foot Exam: UTD Pneumonia Vaccination: 2018 Urine Microalbumin: Up-to-date Statin: Rosuvastatin   Dietary changes since last visit: Grilled protein. Limiting fried foods.    Exercise: Walking daily.   BP Readings from Last 3 Encounters:  07/15/23 136/78  04/24/23 138/76  03/27/23 130/78   Wt Readings from Last 3 Encounters:  07/15/23 181 lb (82.1 kg)  04/24/23 182 lb (82.6 kg)  03/27/23 182 lb (82.6 kg)       Review of Systems  Respiratory:  Negative for shortness of breath.   Cardiovascular:  Negative for chest pain.  Neurological:  Negative for numbness.         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    GERD (gastroesophageal reflux disease)    Hx - diet controlled,  no meds   History of Papanicolaou smear of cervix 08/2013   neg per pt   HSV infection    Hypertension    Hyperthyroidism    Hypothyroidism    had radiation then thyroid  became low   Ileitis    Macular degeneration of left eye    Vitamin D  deficiency     Social History   Socioeconomic History   Marital status: Married    Spouse name: Not on file   Number of children: 0   Years of education: 16   Highest education level: Bachelor's degree (e.g., BA, AB, BS)  Occupational  History   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    Works for [employer].   Courtney Burns grew up in [place]. She lives in [place] with her husband Courtney Burns) and their 1cat    She attended [employer] and obtained her Bachelors in [employer] with a minor in Building services engineer.    She loves cooking and sewing.   Social Drivers of Corporate investment banker Strain: Low Risk  (07/11/2023)   Overall Financial Resource Strain (CARDIA)    Difficulty of Paying Living Expenses: Not very hard  Food Insecurity: No Food Insecurity (07/11/2023)   Hunger Vital Sign    Worried About Running Out of Food in the Last Year: Never true    Ran Out of Food in the  Last Year: Never true  Transportation Needs: No Transportation Needs (07/11/2023)   PRAPARE - Administrator, Civil Service (Medical): No    Lack of Transportation (Non-Medical): No  Physical Activity: Sufficiently Active (07/11/2023)   Exercise Vital Sign    Days of Exercise per Week: 5 days    Minutes of Exercise per Session: 30 min  Stress: No Stress Concern Present (07/11/2023)   [employer] of Occupational Health - Occupational Stress Questionnaire    Feeling of Stress : Only a little  Social Connections: Socially Integrated (07/11/2023)   Social Connection and Isolation Panel [NHANES]    Frequency of Communication with Friends and Family: More than three times a week    Frequency of Social Gatherings with Friends and Family: More than three times a  week    Attends Religious Services: More than 4 times per year    Active Member of Clubs or Organizations: Yes    Attends Engineer, structural: More than 4 times per year    Marital Status: Married  Catering manager Violence: Not on file    Past Surgical History:  Procedure Laterality Date   APPENDECTOMY  1970   BUNIONECTOMY Right 07/2001   right foot   CHOLECYSTECTOMY  2005   COLONOSCOPY  01/2004; 06/2015   Dr. Laurelyn Ponder, [place] - polyp; nl   ECTOPIC PREGNANCY SURGERY  1995   left salpingectomy   RIGHT OOPHORECTOMY Right 03/2003   Abscessed cyst removed   WISDOM TOOTH EXTRACTION      Family History  Problem Relation Age of Onset   Hypertension Mother    Diabetes Mother    Cancer Mother 74       GBM   Hyperlipidemia Father    Heart disease Father 19       MI   Hypertension Father    Diabetes Father    Depression Father    COPD Father    Stroke Maternal Grandmother    Liver cancer Maternal Grandfather    Cancer Maternal Grandfather 46       lung   Heart disease Paternal Grandmother        MI   Diabetes Paternal Grandmother    Prostate cancer Paternal Grandfather 30   Cancer - Prostate Paternal Grandfather    Diabetes Maternal Aunt        Uncontrolled with complications   Kidney disease Maternal Aunt        Renal failure - dialysis   Cancer Maternal Uncle 42       brain tumor   Colon cancer Neg Hx    Esophageal cancer Neg Hx    Rectal cancer Neg Hx    Stomach cancer Neg Hx    Breast cancer Neg Hx     Allergies  Allergen Reactions   Glipizide  Itching   Sulfa Antibiotics Rash    Current Outpatient Medications on File Prior to Visit  Medication Sig Dispense Refill   amLODipine  (NORVASC ) 5 MG tablet TAKE 1 TABLET BY MOUTH EVERY DAY FOR BLOOD PRESSURE 90 tablet 2   brimonidine (ALPHAGAN) 0.2 % ophthalmic solution Place 1 drop into the left eye 2 (two) times daily.     ciprofloxacin (CILOXAN) 0.3 % ophthalmic solution INSTILL ONE DROP INTO LEFT  EYE 4 TIMES A DAY FOR 2 DAYS AFTER EACH MONTHLY EYE INJECTION     clobetasol  ointment (TEMOVATE ) 0.05 % Apply to affected area 1-2 times wkly as maintenance 30 g 0   CONTOUR NEXT TEST  test strip USE AS INSTRUCTED TO TEST BLOOD SUGAR UP TO 3 TIMES DAILY 100 strip 5   levothyroxine  (SYNTHROID ) 75 MCG tablet TAKE 1 TAB EVERY AM ON EMPTY STOMACH W/WATER ONLY. NO FOOD OR OTHER MEDICATIONS FOR 30 MINUTES. 90 tablet 2   lisinopril  (ZESTRIL ) 20 MG tablet TAKE 1 TABLET (20 MG TOTAL) BY MOUTH DAILY. FOR BLOOD PRESSURE. 90 tablet 2   metoprolol  tartrate (LOPRESSOR ) 25 MG tablet TAKE 0.5 TABLETS (12.5 MG TOTAL) BY MOUTH 2 (TWO) TIMES DAILY. 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 for cholesterol. 90 tablet 2   Semaglutide , 1 MG/DOSE, (OZEMPIC , 1 MG/DOSE,) 4 MG/3ML SOPN INJECT 1 MG AS DIRECTED ONCE A WEEK. FOR DIABETES. 9 mL 0   valACYclovir  (VALTREX ) 500 MG tablet TAKE 1 TABLET BY MOUTH TWICE A DAY 6 tablet 0   Vitamin D , Ergocalciferol , (DRISDOL ) 1.25 MG (50000 UNIT) CAPS capsule Take 1 capsule by mouth once weekly for 12 weeks. 12 capsule 0   No current facility-administered medications on file prior to visit.    BP 136/78   Pulse 98   Temp 98.6 F (37 C) (Temporal)   Ht 5\' 4"  (1.626 m)   Wt 181 lb (82.1 kg)   LMP 07/07/2013   SpO2 100%   BMI 31.07 kg/m  Objective:   Physical Exam Cardiovascular:     Rate and Rhythm: Normal rate and regular rhythm.  Pulmonary:     Effort: Pulmonary effort is normal.     Breath sounds: Normal breath sounds.  Musculoskeletal:     Cervical back: Neck supple.  Skin:    General: Skin is warm and dry.  Neurological:     Mental Status: She is alert and oriented to person, place, and time.  Psychiatric:        Mood and Affect: Mood normal.            Assessment & Plan:  Type 2 diabetes mellitus with hyperglycemia, without long-term current use of insulin (HCC) Assessment & Plan: Improved and controlled with A1C of 6.2 today.  Reduce metformin  to 500 mg BID and Ozempic  1 mg weekly.   Follow up in 6 months.   Orders: -     POCT glycosylated hemoglobin (Hb A1C)  Vitamin D  deficiency Assessment & Plan: Repeat vitamin D  pending. Continue 5000 international units daily and vitamin K.    Orders: -     VITAMIN D  25 Hydroxy (Vit-D Deficiency, Fractures)  Type 2 diabetes mellitus without complication, without long-term current use of insulin (HCC) Assessment & Plan: Improved and controlled with A1C of 6.2 today.  Reduce metformin  to 500 mg BID and Ozempic  1 mg weekly.   Follow up in 6 months.   Orders: -     metFORMIN  HCl; Take 0.5 tablets (500 mg total) by mouth 2 (two) times daily with a meal. For diabetes.        Cathey Fredenburg K Loi Rennaker, NP

## 2023-07-15 NOTE — Assessment & Plan Note (Signed)
 Repeat vitamin D  pending. Continue 5000 international units daily and vitamin K.

## 2023-07-15 NOTE — Assessment & Plan Note (Addendum)
 Improved and controlled with A1C of 6.2 today.  Reduce metformin  to 500 mg BID and Ozempic  1 mg weekly.   Follow up in 6 months.

## 2023-07-25 ENCOUNTER — Encounter (INDEPENDENT_AMBULATORY_CARE_PROVIDER_SITE_OTHER): Admitting: Ophthalmology

## 2023-07-25 DIAGNOSIS — H353221 Exudative age-related macular degeneration, left eye, with active choroidal neovascularization: Secondary | ICD-10-CM | POA: Diagnosis not present

## 2023-07-25 DIAGNOSIS — I10 Essential (primary) hypertension: Secondary | ICD-10-CM | POA: Diagnosis not present

## 2023-07-25 DIAGNOSIS — H35033 Hypertensive retinopathy, bilateral: Secondary | ICD-10-CM | POA: Diagnosis not present

## 2023-07-25 DIAGNOSIS — H353112 Nonexudative age-related macular degeneration, right eye, intermediate dry stage: Secondary | ICD-10-CM | POA: Diagnosis not present

## 2023-07-25 DIAGNOSIS — H43813 Vitreous degeneration, bilateral: Secondary | ICD-10-CM

## 2023-08-25 ENCOUNTER — Other Ambulatory Visit: Payer: Self-pay | Admitting: Primary Care

## 2023-08-25 DIAGNOSIS — R000 Tachycardia, unspecified: Secondary | ICD-10-CM

## 2023-08-26 ENCOUNTER — Other Ambulatory Visit: Payer: Self-pay | Admitting: Primary Care

## 2023-08-26 DIAGNOSIS — R000 Tachycardia, unspecified: Secondary | ICD-10-CM

## 2023-08-26 NOTE — Telephone Encounter (Signed)
 Copied from CRM [id number]. Topic: Clinical - Medication Refill >> Aug 26, 2023  5:03 PM Leah C wrote: Medication: metoprolol  tartrate (LOPRESSOR ) 25 MG tablet  Has the patient contacted their pharmacy? [facility] and stated that they didn't receive the script and asked if it could be sent over again.  (Agent: If no, request that the patient contact the pharmacy for the refill. If patient does not wish to contact the pharmacy document the reason why and proceed with request.) (Agent: If yes, when and what did the pharmacy advise?)  This is the patient's preferred pharmacy:  [id number] [place], [place] - [address] [place] [address] Phone: [phone] Fax: [phone]  Is this the correct pharmacy for this prescription? Yes If no, delete pharmacy and type the correct one.   Has the prescription been filled recently? Yes  Is the patient out of the medication? Yes  Has the patient been seen for an appointment in the last year OR does the patient have an upcoming appointment? Yes  Can we respond through MyChart? Yes  Agent: Please be advised that Rx refills may take up to 3 business days. We ask that you follow-up with your pharmacy.

## 2023-08-27 MED ORDER — METOPROLOL TARTRATE 25 MG PO TABS: 12.5000 mg | ORAL_TABLET | Freq: Two times a day (BID) | ORAL | 1 refills | Status: DC

## 2023-09-09 ENCOUNTER — Encounter (INDEPENDENT_AMBULATORY_CARE_PROVIDER_SITE_OTHER): Admitting: Ophthalmology

## 2023-09-09 DIAGNOSIS — H353112 Nonexudative age-related macular degeneration, right eye, intermediate dry stage: Secondary | ICD-10-CM

## 2023-09-09 DIAGNOSIS — I10 Essential (primary) hypertension: Secondary | ICD-10-CM | POA: Diagnosis not present

## 2023-09-09 DIAGNOSIS — H2513 Age-related nuclear cataract, bilateral: Secondary | ICD-10-CM | POA: Diagnosis not present

## 2023-09-09 DIAGNOSIS — H35033 Hypertensive retinopathy, bilateral: Secondary | ICD-10-CM | POA: Diagnosis not present

## 2023-09-09 DIAGNOSIS — H43813 Vitreous degeneration, bilateral: Secondary | ICD-10-CM | POA: Diagnosis not present

## 2023-09-09 DIAGNOSIS — H353221 Exudative age-related macular degeneration, left eye, with active choroidal neovascularization: Secondary | ICD-10-CM

## 2023-09-10 ENCOUNTER — Encounter (INDEPENDENT_AMBULATORY_CARE_PROVIDER_SITE_OTHER): Admitting: Ophthalmology

## 2023-09-12 ENCOUNTER — Other Ambulatory Visit: Payer: Self-pay | Admitting: Primary Care

## 2023-09-12 DIAGNOSIS — E1165 Type 2 diabetes mellitus with hyperglycemia: Secondary | ICD-10-CM

## 2023-10-01 ENCOUNTER — Other Ambulatory Visit: Payer: Self-pay | Admitting: Primary Care

## 2023-10-01 DIAGNOSIS — E119 Type 2 diabetes mellitus without complications: Secondary | ICD-10-CM

## 2023-10-28 ENCOUNTER — Encounter (INDEPENDENT_AMBULATORY_CARE_PROVIDER_SITE_OTHER): Admitting: Ophthalmology

## 2023-10-28 DIAGNOSIS — H35033 Hypertensive retinopathy, bilateral: Secondary | ICD-10-CM | POA: Diagnosis not present

## 2023-10-28 DIAGNOSIS — H353112 Nonexudative age-related macular degeneration, right eye, intermediate dry stage: Secondary | ICD-10-CM

## 2023-10-28 DIAGNOSIS — I10 Essential (primary) hypertension: Secondary | ICD-10-CM | POA: Diagnosis not present

## 2023-10-28 DIAGNOSIS — H2513 Age-related nuclear cataract, bilateral: Secondary | ICD-10-CM

## 2023-10-28 DIAGNOSIS — H353221 Exudative age-related macular degeneration, left eye, with active choroidal neovascularization: Secondary | ICD-10-CM

## 2023-10-28 DIAGNOSIS — H43813 Vitreous degeneration, bilateral: Secondary | ICD-10-CM

## 2023-12-16 ENCOUNTER — Encounter (INDEPENDENT_AMBULATORY_CARE_PROVIDER_SITE_OTHER): Admitting: Ophthalmology

## 2023-12-16 DIAGNOSIS — H43813 Vitreous degeneration, bilateral: Secondary | ICD-10-CM | POA: Diagnosis not present

## 2023-12-16 DIAGNOSIS — H353112 Nonexudative age-related macular degeneration, right eye, intermediate dry stage: Secondary | ICD-10-CM

## 2023-12-16 DIAGNOSIS — I10 Essential (primary) hypertension: Secondary | ICD-10-CM | POA: Diagnosis not present

## 2023-12-16 DIAGNOSIS — H35033 Hypertensive retinopathy, bilateral: Secondary | ICD-10-CM | POA: Diagnosis not present

## 2023-12-16 DIAGNOSIS — H353221 Exudative age-related macular degeneration, left eye, with active choroidal neovascularization: Secondary | ICD-10-CM | POA: Diagnosis not present

## 2023-12-17 ENCOUNTER — Encounter (INDEPENDENT_AMBULATORY_CARE_PROVIDER_SITE_OTHER): Admitting: Ophthalmology

## 2023-12-31 ENCOUNTER — Other Ambulatory Visit: Payer: Self-pay | Admitting: Primary Care

## 2023-12-31 DIAGNOSIS — E785 Hyperlipidemia, unspecified: Secondary | ICD-10-CM

## 2023-12-31 DIAGNOSIS — E039 Hypothyroidism, unspecified: Secondary | ICD-10-CM

## 2023-12-31 DIAGNOSIS — I10 Essential (primary) hypertension: Secondary | ICD-10-CM

## 2024-01-20 ENCOUNTER — Ambulatory Visit: Admitting: Primary Care

## 2024-01-20 ENCOUNTER — Ambulatory Visit: Payer: Self-pay | Admitting: Primary Care

## 2024-01-20 ENCOUNTER — Encounter: Payer: Self-pay | Admitting: Primary Care

## 2024-01-20 VITALS — BP 132/80 | HR 86 | Temp 98.2°F | Ht 62.5 in | Wt 188.2 lb

## 2024-01-20 DIAGNOSIS — R000 Tachycardia, unspecified: Secondary | ICD-10-CM

## 2024-01-20 DIAGNOSIS — E559 Vitamin D deficiency, unspecified: Secondary | ICD-10-CM | POA: Diagnosis not present

## 2024-01-20 DIAGNOSIS — E785 Hyperlipidemia, unspecified: Secondary | ICD-10-CM

## 2024-01-20 DIAGNOSIS — Z23 Encounter for immunization: Secondary | ICD-10-CM

## 2024-01-20 DIAGNOSIS — I10 Essential (primary) hypertension: Secondary | ICD-10-CM | POA: Diagnosis not present

## 2024-01-20 DIAGNOSIS — Z0000 Encounter for general adult medical examination without abnormal findings: Secondary | ICD-10-CM | POA: Diagnosis not present

## 2024-01-20 DIAGNOSIS — E039 Hypothyroidism, unspecified: Secondary | ICD-10-CM

## 2024-01-20 DIAGNOSIS — Z7984 Long term (current) use of oral hypoglycemic drugs: Secondary | ICD-10-CM

## 2024-01-20 DIAGNOSIS — K7581 Nonalcoholic steatohepatitis (NASH): Secondary | ICD-10-CM | POA: Diagnosis not present

## 2024-01-20 DIAGNOSIS — Z7985 Long-term (current) use of injectable non-insulin antidiabetic drugs: Secondary | ICD-10-CM | POA: Diagnosis not present

## 2024-01-20 DIAGNOSIS — A6000 Herpesviral infection of urogenital system, unspecified: Secondary | ICD-10-CM | POA: Diagnosis not present

## 2024-01-20 DIAGNOSIS — E1165 Type 2 diabetes mellitus with hyperglycemia: Secondary | ICD-10-CM

## 2024-01-20 LAB — HEMOGLOBIN A1C: Hgb A1c MFr Bld: 6.8 % — ABNORMAL HIGH (ref 4.6–6.5)

## 2024-01-20 LAB — MICROALBUMIN / CREATININE URINE RATIO
Creatinine,U: 182.6 mg/dL
Microalb Creat Ratio: 6.5 mg/g (ref 0.0–30.0)
Microalb, Ur: 1.2 mg/dL (ref 0.0–1.9)

## 2024-01-20 LAB — COMPREHENSIVE METABOLIC PANEL WITH GFR
ALT: 18 U/L (ref 0–35)
AST: 16 U/L (ref 0–37)
Albumin: 4.3 g/dL (ref 3.5–5.2)
Alkaline Phosphatase: 80 U/L (ref 39–117)
BUN: 18 mg/dL (ref 6–23)
CO2: 26 meq/L (ref 19–32)
Calcium: 9.3 mg/dL (ref 8.4–10.5)
Chloride: 103 meq/L (ref 96–112)
Creatinine, Ser: 0.82 mg/dL (ref 0.40–1.20)
GFR: 75.46 mL/min (ref >60.00)
Glucose, Bld: 117 mg/dL — ABNORMAL HIGH (ref 70–99)
Potassium: 4.2 meq/L (ref 3.5–5.1)
Sodium: 139 meq/L (ref 135–145)
Total Bilirubin: 0.4 mg/dL (ref 0.2–1.2)
Total Protein: 6.3 g/dL (ref 6.0–8.3)

## 2024-01-20 LAB — TSH: TSH: 1.69 u[IU]/mL (ref 0.35–5.50)

## 2024-01-20 LAB — VITAMIN D 25 HYDROXY (VIT D DEFICIENCY, FRACTURES): VITD: 35.27 ng/mL (ref 30.00–100.00)

## 2024-01-20 LAB — LIPID PANEL
Cholesterol: 131 mg/dL (ref 0–200)
HDL: 34.8 mg/dL — ABNORMAL LOW (ref >39.00)
LDL Cholesterol: 33 mg/dL (ref 0–99)
NonHDL: 95.86
Total CHOL/HDL Ratio: 4
Triglycerides: 312 mg/dL — ABNORMAL HIGH (ref 0.0–149.0)
VLDL: 62.4 mg/dL — ABNORMAL HIGH (ref 0.0–40.0)

## 2024-01-20 NOTE — Addendum Note (Signed)
 Addended by: Oriana Horiuchi on: 01/20/2024 07:42 AM   Modules accepted: Orders

## 2024-01-20 NOTE — Assessment & Plan Note (Signed)
 Repeat LFT's pending.

## 2024-01-20 NOTE — Assessment & Plan Note (Signed)
 Repeat A1c pending. Urine microalbumin due and pending. Foot exam today.  Continue metformin  500 mg twice daily, Ozempic  1 mg weekly  Follow-up in 6 months.

## 2024-01-20 NOTE — Assessment & Plan Note (Signed)
 Controlled.  Continue metoprolol to tartrate 12.5 mg twice daily.

## 2024-01-20 NOTE — Progress Notes (Signed)
 Subjective:    Patient ID: Courtney Burns, female    DOB: [birth year], 63 y.o.   MRN: [id number]  Courtney Burns is a very pleasant 63 y.o. female with a history of type 2 diabetes, hypertension, hyperlipidemia who presents today for complete physical and follow up of chronic conditions.  Immunizations: -Tetanus: Completed in 2018 -Influenza: Influenza vaccine provided today.  -Shingles: Completed Shingrix  series -Pneumonia: Completed 2018   Diet: Fair diet.  Exercise: No regular exercise.  Eye exam: Completes every 7 weeks Dental exam: Completes semi-annually    Pap Smear: Completed in March 2022, follows with GYN. Mammogram: Completed in May 2025   Colonoscopy: Completed in 2017, due 2027   BP Readings from Last 3 Encounters:  01/20/24 132/80  07/15/23 136/78  04/24/23 138/76       Review of Systems  Constitutional:  Negative for unexpected weight change.  HENT:  Negative for rhinorrhea.   Respiratory:  Negative for cough and shortness of breath.   Cardiovascular:  Negative for chest pain.  Gastrointestinal:  Negative for constipation and diarrhea.  Genitourinary:  Negative for difficulty urinating and menstrual problem.  Musculoskeletal:  Negative for arthralgias and myalgias.  Skin:  Negative for rash.  Allergic/Immunologic: Negative for environmental allergies.  Neurological:  Negative for dizziness, numbness and headaches.  Psychiatric/Behavioral:  The patient is not nervous/anxious.          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    GERD (gastroesophageal reflux disease)    Hx - diet controlled,  no meds   History of Papanicolaou smear of cervix 08/2013   neg per pt   HSV infection    Hypertension    Hyperthyroidism    Hypothyroidism    had radiation then thyroid  became low   Ileitis     Macular degeneration of left eye    Vitamin D  deficiency     Social History   Socioeconomic History   Marital status: Married    Spouse name: Not on file   Number of children: 0   Years of education: 16   Highest education level: Bachelor's degree (e.g., BA, AB, BS)  Occupational History   Occupation: Engineering Geologist: [employer]    Comment: Pension Scheme Manager  Tobacco Use   Smoking status: Never   Smokeless tobacco: Never  Vaping Use   Vaping status: Never Used  Substance and Sexual Activity   Alcohol use: No    Alcohol/week: 0.0 standard drinks of alcohol   Drug use: No   Sexual activity: Not Currently    Partners: Male    Birth control/protection: Post-menopausal  Other Topics Concern   Not on file  Social History Narrative   Lives in [place].    Works for [employer].   Courtney Burns grew up in [place]. She lives in [place] with her husband Courtney Burns) and their 1cat    She attended [employer] and obtained her Bachelors in [employer] with a minor in Building Services Engineer.    She loves cooking and sewing.   Social Drivers of Corporate Investment Banker Strain: Low Risk  (01/19/2024)   Overall Financial Resource Strain (CARDIA)    Difficulty of Paying Living Expenses: Not hard at all  Food Insecurity: No Food Insecurity (01/19/2024)  Hunger Vital Sign    Worried About Running Out of Food in the Last Year: Never true    Ran Out of Food in the Last Year: Never true  Transportation Needs: No Transportation Needs (01/19/2024)   PRAPARE - Administrator, Civil Service (Medical): No    Lack of Transportation (Non-Medical): No  Physical Activity: Sufficiently Active (01/19/2024)   Exercise Vital Sign    Days of Exercise per Week: 5 days    Minutes of Exercise per Session: 30 min  Stress: No Stress Concern Present (01/19/2024)   [employer] of Occupational Health - Occupational Stress Questionnaire     Feeling of Stress: Only a little  Social Connections: Socially Integrated (01/19/2024)   Social Connection and Isolation Panel    Frequency of Communication with Friends and Family: More than three times a week    Frequency of Social Gatherings with Friends and Family: Twice a week    Attends Religious Services: More than 4 times per year    Active Member of Clubs or Organizations: Yes    Attends Engineer, Structural: More than 4 times per year    Marital Status: Married  Catering Manager Violence: Not on file    Past Surgical History:  Procedure Laterality Date   APPENDECTOMY  1970   BUNIONECTOMY Right 07/2001   right foot   CHOLECYSTECTOMY  2005   COLONOSCOPY  01/2004; 06/2015   Dr. Chapman Jacobs, [place] - polyp; nl   ECTOPIC PREGNANCY SURGERY  1995   left salpingectomy   RIGHT OOPHORECTOMY Right 03/2003   Abscessed cyst removed   WISDOM TOOTH EXTRACTION      Family History  Problem Relation Age of Onset   Hypertension Mother    Diabetes Mother    Cancer Mother 62       GBM   Hyperlipidemia Father    Heart disease Father 88       MI   Hypertension Father    Diabetes Father    Depression Father    COPD Father    Stroke Maternal Grandmother    Liver cancer Maternal Grandfather    Cancer Maternal Grandfather 39       lung   Heart disease Paternal Grandmother        MI   Diabetes Paternal Grandmother    Prostate cancer Paternal Grandfather 8   Cancer - Prostate Paternal Grandfather    Diabetes Maternal Aunt        Uncontrolled with complications   Kidney disease Maternal Aunt        Renal failure - dialysis   Cancer Maternal Uncle 42       brain tumor   Kidney disease Maternal Aunt    Colon cancer Neg Hx    Esophageal cancer Neg Hx    Rectal cancer Neg Hx    Stomach cancer Neg Hx    Breast cancer Neg Hx     Allergies  Allergen Reactions   Glipizide  Itching   Sulfa Antibiotics Rash    Current Outpatient Medications on File Prior to Visit   Medication Sig Dispense Refill   amLODipine  (NORVASC ) 5 MG tablet TAKE 1 TABLET BY MOUTH EVERY DAY FOR BLOOD PRESSURE 90 tablet 0   brimonidine (ALPHAGAN) 0.2 % ophthalmic solution Place 1 drop into the left eye 2 (two) times daily.     ciprofloxacin (CILOXAN) 0.3 % ophthalmic solution INSTILL ONE DROP INTO LEFT EYE 4 TIMES A DAY FOR 2 DAYS AFTER [facility]  MONTHLY EYE INJECTION     clobetasol  ointment (TEMOVATE ) 0.05 % Apply to affected area 1-2 times wkly as maintenance 30 g 0   CONTOUR NEXT TEST test strip USE AS INSTRUCTED TO TEST BLOOD SUGAR UP TO 3 TIMES DAILY 100 strip 5   levothyroxine  (SYNTHROID ) 75 MCG tablet TAKE 1 TAB EVERY AM ON EMPTY STOMACH W/WATER ONLY. NO FOOD OR OTHER MEDICATIONS FOR 30 MINUTES. 90 tablet 0   lisinopril  (ZESTRIL ) 20 MG tablet TAKE 1 TABLET (20 MG TOTAL) BY MOUTH DAILY. FOR BLOOD PRESSURE. 90 tablet 0   metFORMIN  (GLUCOPHAGE ) 1000 MG tablet Take 0.5 tablets (500 mg total) by mouth 2 (two) times daily with a meal. For diabetes.     metoprolol  tartrate (LOPRESSOR ) 25 MG tablet Take 0.5 tablets (12.5 mg total) by mouth 2 (two) times daily. 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 FOR DIABETES. 9 mL 1   valACYclovir  (VALTREX ) 500 MG tablet TAKE 1 TABLET BY MOUTH TWICE A DAY 6 tablet 0   No current facility-administered medications on file prior to visit.    BP 132/80   Pulse 86   Temp 98.2 F (36.8 C) (Oral)   Ht 5' 2.5 (1.588 m)   Wt 188 lb 4 oz (85.4 kg)   LMP 07/07/2013   SpO2 99%   BMI 33.88 kg/m  Objective:   Physical Exam HENT:     Right Ear: Tympanic membrane and ear canal normal.     Left Ear: Tympanic membrane  and ear canal normal.  Eyes:     Pupils: Pupils are equal, round, and reactive to light.  Cardiovascular:     Rate and Rhythm: Normal rate and regular rhythm.  Pulmonary:     Effort: Pulmonary effort is normal.     Breath sounds: Normal breath sounds.  Abdominal:     General: Bowel sounds are normal.     Palpations: Abdomen is soft.     Tenderness: There is no abdominal tenderness.  Musculoskeletal:        General: Normal range of motion.     Cervical back: Neck supple.  Skin:    General: Skin is warm and dry.  Neurological:     Mental Status: She is alert and oriented to person, place, and time.     Cranial Nerves: No cranial nerve deficit.     Deep Tendon Reflexes:     Reflex Scores:      Patellar reflexes are 2+ on the right side and 2+ on the left side. Psychiatric:        Mood and Affect: Mood normal.     Physical Exam        Assessment & Plan:  Annual physical exam Assessment & Plan: Immunizations UTD. Influenza vaccine provided today.  Pap smear UTD. Follows with GYN Mammogram UTD. Colonoscopy UTD, due 2027  Discussed the importance of a healthy diet and regular exercise in order for weight loss, and to reduce the risk of further co-morbidity.  Exam stable. Labs pending.  Follow up in 1 year for repeat physical.    Need for influenza vaccination  Essential  hypertension Assessment & Plan: Controlled.  Continue amlodipine  5 mg daily, metoprolol  tartrate 12.5 mg twice daily.    NASH (nonalcoholic steatohepatitis) Assessment & Plan: Repeat LFTs pending.   Type 2 diabetes mellitus with hyperglycemia, without long-term current use of insulin (HCC) Assessment & Plan: Repeat A1c pending. Urine microalbumin due and pending. Foot exam today.  Continue metformin  500 mg twice daily, Ozempic  1 mg weekly  Follow-up in 6 months.  Orders: -     Hemoglobin A1c -     Microalbumin / creatinine urine ratio  Hypothyroidism, unspecified  type Assessment & Plan: Repeat TSH pending.  Continue levothyroxine  75 mcg daily.  Orders: -     TSH  Genital herpes simplex, unspecified site Assessment & Plan: Infrequent outbreaks.  Continue Valtrex  500 mg twice daily as needed.   Vitamin D  deficiency Assessment & Plan: Continue OTC vitamin D  with K.  Repeat level pending.  Orders: -     VITAMIN D  25 Hydroxy (Vit-D Deficiency, Fractures)  Tachycardia Assessment & Plan: Controlled.  Continue metoprolol  to tartrate 12.5 mg twice daily.   Hyperlipidemia, unspecified hyperlipidemia type Assessment & Plan: Repeat lipid panel pending.  Continue rosuvastatin  5 mg daily.  Orders: -     Comprehensive metabolic panel with GFR -     Lipid panel    Assessment and Plan Assessment & Plan         Comer MARLA Gaskins, NP    History of Present Illness

## 2024-01-20 NOTE — Assessment & Plan Note (Signed)
 Controlled.  Continue amlodipine  5 mg daily, metoprolol  tartrate 12.5 mg twice daily.

## 2024-01-20 NOTE — Patient Instructions (Signed)
 Stop by the lab prior to leaving today. I will notify you of your results once received.   Please schedule a follow up visit for 6 months for a diabetes check.  It was a pleasure to see you today!

## 2024-01-20 NOTE — Assessment & Plan Note (Signed)
 Infrequent outbreaks.  Continue Valtrex  500 mg twice daily as needed.

## 2024-01-20 NOTE — Assessment & Plan Note (Signed)
 Immunizations UTD. Influenza vaccine provided today.  Pap smear UTD. Follows with GYN Mammogram UTD. Colonoscopy UTD, due 2027  Discussed the importance of a healthy diet and regular exercise in order for weight loss, and to reduce the risk of further co-morbidity.  Exam stable. Labs pending.  Follow up in 1 year for repeat physical.

## 2024-01-20 NOTE — Assessment & Plan Note (Signed)
 Continue OTC vitamin D  with K.  Repeat level pending.

## 2024-01-20 NOTE — Assessment & Plan Note (Signed)
 Repeat lipid panel pending. Continue rosuvastatin 5 mg daily.

## 2024-01-20 NOTE — Assessment & Plan Note (Signed)
 Repeat TSH pending.  Continue levothyroxine  75 mcg daily.

## 2024-01-29 ENCOUNTER — Ambulatory Visit: Payer: Self-pay

## 2024-01-29 NOTE — Telephone Encounter (Signed)
 FYI Only or Action Required?: FYI only for provider: appointment scheduled on 01/30/24 with [facility].  Patient was last seen in primary care on 01/20/2024 by Gretta Comer POUR, NP.  Called Nurse Triage reporting Rash.  Symptoms began a week ago.  Interventions attempted: Rest, hydration, or home remedies.  Symptoms are: unchanged.  Triage Disposition: See Physician Within 24 Hours  Patient/caregiver understands and will follow disposition?: Yes   Copied from CRM #[id number]. Topic: Clinical - Red Word Triage >> Jan 29, 2024  8:19 AM Revonda D wrote: Red Word that prompted transfer to Nurse Triage: pain   Pt stated that she has a rash on her left knee and is also experiencing pain in that area. Pt stated that she is worried it may be shingles and wants to schedule an appt with the provider.      ----------------------------------------------------------------------- From previous Reason for Contact - Scheduling: Patient/patient representative is calling to schedule an appointment. Refer to attachments for appointment information. Reason for Disposition  [1] Localized rash is very painful AND [2] no fever  Answer Assessment - Initial Assessment Questions 1. APPEARANCE of RASH: What does the rash look like? (e.g., blisters, dry flaky skin, red spots, redness, sores)     5 spots, redness 2. LOCATION: Where is the rash located?      Left Knee 3. NUMBER: How many spots are there?      Several 4. SIZE: How big are the spots? (e.g., inches, cm; or compare to size of pinhead, tip of pen, eraser, pea)      Bigger than a pinhead, half a BB pellet 5. ONSET: When did the rash start?      X 1 week 6. ITCHING: Does the rash itch? If Yes, ask: How bad is the itch?  (Scale 0-10; or none, mild, moderate, severe)     Itching Thursday, burning began Saturday 7. PAIN: Does the rash hurt? If Yes, ask: How bad is the pain?  (Scale 0-10; or none, mild, moderate, severe)      Burning 8. OTHER SYMPTOMS: Do you have any other symptoms? (e.g., fever)     None  Protocols used: Rash or Redness - Localized-A-AH

## 2024-01-30 ENCOUNTER — Encounter: Payer: Self-pay | Admitting: Family Medicine

## 2024-01-30 ENCOUNTER — Ambulatory Visit: Admitting: Family Medicine

## 2024-01-30 VITALS — BP 141/84 | HR 82 | Ht 64.0 in | Wt 189.7 lb

## 2024-01-30 DIAGNOSIS — M792 Neuralgia and neuritis, unspecified: Secondary | ICD-10-CM

## 2024-01-30 DIAGNOSIS — R21 Rash and other nonspecific skin eruption: Secondary | ICD-10-CM | POA: Diagnosis not present

## 2024-01-30 MED ORDER — VALACYCLOVIR HCL 1 G PO TABS: 1000.0000 mg | ORAL_TABLET | Freq: Three times a day (TID) | ORAL | 0 refills | Status: AC

## 2024-01-30 MED ORDER — GABAPENTIN 100 MG PO CAPS: 100.0000 mg | ORAL_CAPSULE | Freq: Three times a day (TID) | ORAL | 0 refills | Status: AC

## 2024-01-30 NOTE — Progress Notes (Signed)
 Acute Office Visit  Introduced to nurse practitioner role and practice setting.  All questions answered.  Discussed provider/patient relationship and expectations.   Subjective:     Patient ID: Courtney Burns, female    DOB: Jul 22, 1960, 63 y.o.   MRN: [id number]  Chief Complaint  Patient presents with   Acute Visit    Rash on left knee X Saturday gradually worsening. Has tried cortisone for treatment would like to be sure it is not shingles. Associated with pain that is now radiating    Discussed the use of AI scribe software for clinical note transcription with the patient, who gave verbal consent to proceed.  History of Present Illness Courtney Burns is a 63 year old female who presents with a rash and nerve pain.  The patient experienced the onset of a rash and nerve pain last Friday, 01/23/24. Initially, she had a weird sensation in her knee and itchiness without any visible rash. By Friday night and Saturday night, she developed pain similar to shin splints. On Saturday, bumps appeared on her skin, described as 'little shiny dots'.  The patient reports that the rash is located on her left knee. It has progressed from itching to hurting. The nerve pain has moved from below the knee to above the knee, extending into her buttock. She describes the sensation as 'prickly' and states that it is painful to touch or when the sheet touches it, affecting her sleep.  She has a history of genital herpes, but has not had an outbreak in a long time. She is concerned that the current symptoms could be related to shingles due to the tingling sensation she is experiencing. States feels the same as a genital herpes outbreak  No recent bug bites, new soaps, or trauma to her back. Despite the pain, she continues her routine of walking every morning, although she skipped walking yesterday due to the discomfort.  HPI  Review of Systems  Skin:  Positive for rash.        Objective:    BP (!)  141/84 (BP Location: Left Arm, Patient Position: Sitting)   Pulse 82   Ht 5' 4 (1.626 m)   Wt 189 lb 11.2 oz (86 kg)   LMP 07/07/2013   SpO2 99%   BMI 32.56 kg/m    Physical Exam Constitutional:      General: She is not in acute distress.    Appearance: Normal appearance. She is not ill-appearing, toxic-appearing or diaphoretic.  Cardiovascular:     Rate and Rhythm: Normal rate and regular rhythm.  Pulmonary:     Effort: Pulmonary effort is normal. No respiratory distress.     Breath sounds: Normal breath sounds. No stridor. No wheezing, rhonchi or rales.  Chest:     Chest wall: No tenderness.  Lymphadenopathy:     Cervical: No cervical adenopathy.  Skin:    Capillary Refill: Capillary refill takes less than 2 seconds.     Findings: Lesion and rash present.     Comments: See images  Neurological:     Mental Status: She is alert.     Comments: Neuropathic pain on Left thigh, shooting, tingling burning  Psychiatric:        Mood and Affect: Mood normal.        Behavior: Behavior normal.        Thought Content: Thought content normal.        Judgment: Judgment normal.     No results found for any  visits on 01/30/24.         Assessment & Plan:  Assessment and Plan Assessment & Plan Rash - clusters, associated wit neuropathic pain of tingling, burning, shooting- see photos - started on Saturday, 01/24/24, - denies bug bite, plant/outside exposure - Nerve pain across the linear upper left thigh and buttock.  - rash is unilateral, early macule starting to raise - Knee rash, one cluster showing the start of a vesicle - presentations concerning for start of shingles - dermatitic versus herpetic in nature  - concerning for Suspected shingles (herpes zoster), left lower extremity - has had shingles vaccine - Prescribed valacyclovir  1 gram three times a day for 7 days. - Prescribed short-term gabapentin  for nerve pain. - as vesicles start to  - Advised follow-up if rash  persists or worsens - keep lesions covers until dried, avoid skin to skin contact with others, hand hygiene, avoid touching rash - Rash can last upwards of 3-4 weeks.   Problem List Items Addressed This Visit   None Visit Diagnoses       Rash    -  Primary   Relevant Medications   valACYclovir  (VALTREX ) 1000 MG tablet   gabapentin  (NEURONTIN ) 100 MG capsule     Neurogenic pain       Relevant Medications   gabapentin  (NEURONTIN ) 100 MG capsule       Meds ordered this encounter  Medications   valACYclovir  (VALTREX ) 1000 MG tablet    Sig: Take 1 tablet (1,000 mg total) by mouth 3 (three) times daily for 7 days.    Dispense:  21 tablet    Refill:  0   gabapentin  (NEURONTIN ) 100 MG capsule    Sig: Take 1 capsule (100 mg total) by mouth 3 (three) times daily.    Dispense:  30 capsule    Refill:  0    No follow-ups on file.  Curtis DELENA Boom, FNP  I, Curtis DELENA Boom, FNP, have reviewed all documentation for this visit. The documentation on 01/30/24 for the exam, diagnosis, procedures, and orders are all accurate and complete.

## 2024-02-04 ENCOUNTER — Encounter (INDEPENDENT_AMBULATORY_CARE_PROVIDER_SITE_OTHER): Admitting: Ophthalmology

## 2024-02-04 DIAGNOSIS — H353221 Exudative age-related macular degeneration, left eye, with active choroidal neovascularization: Secondary | ICD-10-CM

## 2024-02-04 DIAGNOSIS — H35033 Hypertensive retinopathy, bilateral: Secondary | ICD-10-CM

## 2024-02-04 DIAGNOSIS — H353112 Nonexudative age-related macular degeneration, right eye, intermediate dry stage: Secondary | ICD-10-CM

## 2024-02-04 DIAGNOSIS — H43813 Vitreous degeneration, bilateral: Secondary | ICD-10-CM | POA: Diagnosis not present

## 2024-02-04 DIAGNOSIS — I10 Essential (primary) hypertension: Secondary | ICD-10-CM

## 2024-02-09 NOTE — Progress Notes (Unsigned)
 PCP: Gretta Comer POUR, NP   No chief complaint on file.   HPI:      [name] is a 63 y.o. G2P0020 whose LMP was Patient's last menstrual period was 07/07/2013., presents today for her annual examination.  Her menses are {norm/abn:715}, lasting {number: 22536} days.  Dysmenorrhea {dysmen:716}. She {does:18564} have intermenstrual bleeding.  She {does:18564} have vasomotor sx.   Started on clobetasol  at bedtime for 4 wks for presumed LS by sx and exam, no good area to bx. Pt doing much better, no longer having any sx. Also using coconut oil with sx relief.  Sex activity: {sex active: 315163}. She {does:18564} have vaginal dryness/pain/bleeding.  Last Pap: 05/25/20  Results were: no abnormalities /neg HPV DNA.  Hx of STDs: {STD hx:14358}  Last mammogram: 07/11/23  Results were: normal--routine follow-up in 12 months There is no FH of breast cancer. There is no FH of ovarian cancer. The patient {does:18564} do self-breast exams.  Colonoscopy: 4/17 with Dr. Jinny,  Repeat due after 10*** years.   Tobacco use: {tob:20664} Alcohol use: {Alcohol:11675} No drug use Exercise: {exercise:31265}  She {does:18564} get adequate calcium  and Vitamin D  in her diet.  Labs with PCP.   Patient Active Problem List   Diagnosis Date Noted   Tachycardia 03/12/2022   Chronic right-sided thoracic back pain 12/03/2019   Macular degeneration 08/11/2019   Genital herpes 08/11/2019   Essential hypertension 12/28/2016   Menopausal disorder 05/16/2015   Vitamin D  deficiency 05/16/2015   Solar dermatitis 10/04/2014   NASH (nonalcoholic steatohepatitis) 02/07/2014   Annual physical exam 08/19/2013   Hypothyroidism 08/19/2013   HLD (hyperlipidemia) 08/19/2013   Type 2 diabetes mellitus (HCC) 08/19/2013    Past Surgical History:  Procedure Laterality Date   APPENDECTOMY  1970   BUNIONECTOMY Right 07/2001   right foot   CHOLECYSTECTOMY  2005   COLONOSCOPY  01/2004; 06/2015   Dr. Chapman Jacobs, [place] - polyp; nl   ECTOPIC PREGNANCY SURGERY  1995   left salpingectomy   RIGHT OOPHORECTOMY Right 03/2003   Abscessed cyst removed   WISDOM TOOTH EXTRACTION      Family History  Problem Relation Age of Onset   Hypertension Mother    Diabetes Mother    Cancer Mother 25       GBM   Hyperlipidemia Father    Heart disease Father 62       MI   Hypertension Father    Diabetes Father    Depression Father    COPD Father    Stroke Maternal Grandmother    Liver cancer Maternal Grandfather    Cancer Maternal Grandfather 38       lung   Heart disease Paternal Grandmother        MI   Diabetes Paternal Grandmother    Prostate cancer Paternal Grandfather 19   Cancer - Prostate Paternal Grandfather    Diabetes Maternal Aunt        Uncontrolled with complications   Kidney disease Maternal Aunt        Renal failure - dialysis   Cancer Maternal Uncle 42       brain tumor   Kidney disease Maternal Aunt    Colon cancer Neg Hx    Esophageal cancer Neg Hx    Rectal cancer Neg Hx    Stomach cancer Neg Hx    Breast cancer Neg Hx     Social History   Socioeconomic History   Marital status: Married  Spouse name: Not on file   Number of children: 0   Years of education: 16   Highest education level: Bachelor's degree (e.g., BA, AB, BS)  Occupational History   Occupation: Engineering Geologist: [employer]    Comment: Pension Scheme Manager  Tobacco Use   Smoking status: Never   Smokeless tobacco: Never  Vaping Use   Vaping status: Never Used  Substance and Sexual Activity   Alcohol use: No    Alcohol/week: 0.0 standard drinks of alcohol   Drug use: No   Sexual activity: Not Currently    Partners: Male    Birth control/protection: Post-menopausal  Other Topics Concern   Not on file  Social History Narrative   Lives in [place].    Works for [employer].   Bobie grew up in [place]. She lives in [place] with her husband Irvin) and  their 1cat    She attended [employer] and obtained her Bachelors in [employer] with a minor in Building Services Engineer.    She loves cooking and sewing.   Social Drivers of Corporate Investment Banker Strain: Low Risk  (01/19/2024)   Overall Financial Resource Strain (CARDIA)    Difficulty of Paying Living Expenses: Not hard at all  Food Insecurity: No Food Insecurity (01/19/2024)   Hunger Vital Sign    Worried About Running Out of Food in the Last Year: Never true    Ran Out of Food in the Last Year: Never true  Transportation Needs: No Transportation Needs (01/19/2024)   PRAPARE - Administrator, Civil Service (Medical): No    Lack of Transportation (Non-Medical): No  Physical Activity: Sufficiently Active (01/19/2024)   Exercise Vital Sign    Days of Exercise per Week: 5 days    Minutes of Exercise per Session: 30 min  Stress: No Stress Concern Present (01/19/2024)   [employer] of Occupational Health - Occupational Stress Questionnaire    Feeling of Stress: Only a little  Social Connections: Socially Integrated (01/19/2024)   Social Connection and Isolation Panel    Frequency of Communication with Friends and Family: More than three times a week    Frequency of Social Gatherings with Friends and Family: Twice a week    Attends Religious Services: More than 4 times per year    Active Member of [employer] or Organizations: Yes    Attends Engineer, Structural: More than 4 times per year    Marital Status: Married  Catering Manager Violence: Not on file     Current Outpatient Medications:    amLODipine  (NORVASC ) 5 MG tablet, TAKE 1 TABLET BY MOUTH EVERY DAY FOR BLOOD PRESSURE, Disp: 90 tablet, Rfl: 0   brimonidine (ALPHAGAN) 0.2 % ophthalmic solution, Place 1 drop into the left eye 2 (two) times daily., Disp: , Rfl:    ciprofloxacin (CILOXAN) 0.3 % ophthalmic solution, INSTILL ONE DROP INTO LEFT EYE 4 TIMES A DAY FOR 2 DAYS AFTER EACH MONTHLY  EYE INJECTION, Disp: , Rfl:    clobetasol  ointment (TEMOVATE ) 0.05 %, Apply to affected area 1-2 times wkly as maintenance, Disp: 30 g, Rfl: 0   CONTOUR NEXT TEST test strip, USE AS INSTRUCTED TO TEST BLOOD SUGAR UP TO 3 TIMES DAILY, Disp: 100 strip, Rfl: 5   gabapentin  (NEURONTIN ) 100 MG capsule, Take 1 capsule (100 mg total) by mouth 3 (three) times daily., Disp: 30 capsule, Rfl: 0   levothyroxine  (SYNTHROID ) 75 MCG tablet,  TAKE 1 TAB EVERY AM ON EMPTY STOMACH W/WATER ONLY. NO FOOD OR OTHER MEDICATIONS FOR 30 MINUTES., Disp: 90 tablet, Rfl: 0   lisinopril  (ZESTRIL ) 20 MG tablet, TAKE 1 TABLET (20 MG TOTAL) BY MOUTH DAILY. FOR BLOOD PRESSURE., Disp: 90 tablet, Rfl: 0   metFORMIN  (GLUCOPHAGE ) 1000 MG tablet, Take 0.5 tablets (500 mg total) by mouth 2 (two) times daily with a meal. For diabetes., Disp: , Rfl:    metoprolol  tartrate (LOPRESSOR ) 25 MG tablet, Take 0.5 tablets (12.5 mg total) by mouth 2 (two) times daily. 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 FOR DIABETES., Disp: 9 mL, Rfl: 1     ROS:  Review of Systems BREAST: No symptoms    Objective: LMP 07/07/2013    OBGyn Exam  Results: No results found for this or any previous visit (from the past 24 hours).  Assessment/Plan:  No diagnosis found.   No orders of the defined types were placed in this encounter.           GYN counsel {counseling: 16159}    F/U  No follow-ups on file.  Silva Aamodt B. Tracen Mahler, PA-C 02/09/2024 5:33 PM

## 2024-02-10 ENCOUNTER — Ambulatory Visit: Admitting: Obstetrics and Gynecology

## 2024-02-10 ENCOUNTER — Other Ambulatory Visit (HOSPITAL_COMMUNITY)
Admission: RE | Admit: 2024-02-10 | Discharge: 2024-02-10 | Disposition: A | Discharge status: Home / Self Care | Source: Ambulatory Visit | Attending: Obstetrics and Gynecology | Admitting: Obstetrics and Gynecology

## 2024-02-10 ENCOUNTER — Encounter: Payer: Self-pay | Admitting: Obstetrics and Gynecology

## 2024-02-10 VITALS — BP 124/76 | HR 83 | Ht 64.0 in | Wt 191.0 lb

## 2024-02-10 DIAGNOSIS — L900 Lichen sclerosus et atrophicus: Secondary | ICD-10-CM

## 2024-02-10 DIAGNOSIS — Z1151 Encounter for screening for human papillomavirus (HPV): Secondary | ICD-10-CM

## 2024-02-10 DIAGNOSIS — Z124 Encounter for screening for malignant neoplasm of cervix: Secondary | ICD-10-CM | POA: Diagnosis not present

## 2024-02-10 DIAGNOSIS — Z01419 Encounter for gynecological examination (general) (routine) without abnormal findings: Secondary | ICD-10-CM

## 2024-02-10 DIAGNOSIS — Z01411 Encounter for gynecological examination (general) (routine) with abnormal findings: Secondary | ICD-10-CM

## 2024-02-10 DIAGNOSIS — Z1231 Encounter for screening mammogram for malignant neoplasm of breast: Secondary | ICD-10-CM

## 2024-02-10 DIAGNOSIS — Z1211 Encounter for screening for malignant neoplasm of colon: Secondary | ICD-10-CM

## 2024-02-10 MED ORDER — CLOBETASOL PROPIONATE 0.05 % EX OINT: TOPICAL_OINTMENT | CUTANEOUS | 1 refills | Status: AC

## 2024-02-10 NOTE — Patient Instructions (Signed)
 I value your feedback and you entrusting [place] with your care. If you get a [place] patient survey, I would appreciate you taking the time to let us know about your experience today. Thank you! ? ? ?

## 2024-02-12 ENCOUNTER — Other Ambulatory Visit: Payer: Self-pay | Admitting: Primary Care

## 2024-02-12 DIAGNOSIS — R000 Tachycardia, unspecified: Secondary | ICD-10-CM

## 2024-02-12 LAB — CYTOLOGY - PAP
Adequacy: ABSENT
Comment: NEGATIVE
Diagnosis: NEGATIVE
High risk HPV: NEGATIVE

## 2024-03-07 ENCOUNTER — Other Ambulatory Visit: Payer: Self-pay | Admitting: Primary Care

## 2024-03-07 DIAGNOSIS — E1165 Type 2 diabetes mellitus with hyperglycemia: Secondary | ICD-10-CM

## 2024-03-24 ENCOUNTER — Encounter (INDEPENDENT_AMBULATORY_CARE_PROVIDER_SITE_OTHER): Admitting: Ophthalmology

## 2024-03-24 DIAGNOSIS — H35033 Hypertensive retinopathy, bilateral: Secondary | ICD-10-CM | POA: Diagnosis not present

## 2024-03-24 DIAGNOSIS — H2513 Age-related nuclear cataract, bilateral: Secondary | ICD-10-CM | POA: Diagnosis not present

## 2024-03-24 DIAGNOSIS — H353221 Exudative age-related macular degeneration, left eye, with active choroidal neovascularization: Secondary | ICD-10-CM

## 2024-03-24 DIAGNOSIS — I10 Essential (primary) hypertension: Secondary | ICD-10-CM

## 2024-03-24 DIAGNOSIS — H43813 Vitreous degeneration, bilateral: Secondary | ICD-10-CM | POA: Diagnosis not present

## 2024-03-24 DIAGNOSIS — H353112 Nonexudative age-related macular degeneration, right eye, intermediate dry stage: Secondary | ICD-10-CM

## 2024-03-27 ENCOUNTER — Other Ambulatory Visit: Payer: Self-pay | Admitting: Primary Care

## 2024-03-27 DIAGNOSIS — I10 Essential (primary) hypertension: Secondary | ICD-10-CM

## 2024-03-27 DIAGNOSIS — E785 Hyperlipidemia, unspecified: Secondary | ICD-10-CM

## 2024-03-28 ENCOUNTER — Other Ambulatory Visit: Payer: Self-pay | Admitting: Primary Care

## 2024-03-28 DIAGNOSIS — I10 Essential (primary) hypertension: Secondary | ICD-10-CM

## 2024-03-28 DIAGNOSIS — E039 Hypothyroidism, unspecified: Secondary | ICD-10-CM

## 2024-05-12 ENCOUNTER — Encounter (INDEPENDENT_AMBULATORY_CARE_PROVIDER_SITE_OTHER): Admitting: Ophthalmology

## 2024-07-20 ENCOUNTER — Ambulatory Visit: Admitting: Primary Care
